# Patient Record
Sex: Female | Born: 1995 | Race: White | Hispanic: No | Marital: Single | Smoking: Former smoker
Health system: Southern US, Community
[De-identification: ages and names within clinical notes are randomized; demographics above are authoritative.]

## PROBLEM LIST (undated history)

## (undated) ENCOUNTER — Inpatient Hospital Stay (HOSPITAL_COMMUNITY): Payer: Self-pay

## (undated) ENCOUNTER — Ambulatory Visit: Discharge status: Absent without leave | Payer: Medicaid Other

## (undated) DIAGNOSIS — G44309 Post-traumatic headache, unspecified, not intractable: Secondary | ICD-10-CM

## (undated) DIAGNOSIS — Z30017 Encounter for initial prescription of implantable subdermal contraceptive: Secondary | ICD-10-CM

## (undated) DIAGNOSIS — S0990XS Unspecified injury of head, sequela: Secondary | ICD-10-CM

## (undated) DIAGNOSIS — T1490XA Injury, unspecified, initial encounter: Secondary | ICD-10-CM

## (undated) DIAGNOSIS — J45909 Unspecified asthma, uncomplicated: Secondary | ICD-10-CM

## (undated) DIAGNOSIS — R569 Unspecified convulsions: Secondary | ICD-10-CM

## (undated) DIAGNOSIS — J069 Acute upper respiratory infection, unspecified: Secondary | ICD-10-CM

## (undated) HISTORY — PX: BLADDER SURGERY: SHX569

## (undated) HISTORY — DX: Unspecified convulsions: R56.9

## (undated) HISTORY — PX: TONSILLECTOMY: SUR1361

## (undated) HISTORY — DX: Encounter for initial prescription of implantable subdermal contraceptive: Z30.017

## (undated) HISTORY — DX: Injury, unspecified, initial encounter: T14.90XA

## (undated) HISTORY — PX: SALIVARY GLAND SURGERY: SHX768

## (undated) HISTORY — DX: Unspecified injury of head, sequela: S09.90XS

## (undated) HISTORY — DX: Unspecified injury of head, sequela: G44.309

---
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
## 2016-01-06 NOTE — ED Provider Notes (Signed)
CSN: [id number]     Arrival date & time 12/26/15  1840 History   First MD Initiated Contact with Patient 12/26/15 1855     Chief Complaint  Patient presents with  . URI     (Consider location/radiation/quality/duration/timing/severity/associated sxs/prior Treatment) HPI   20 year old female with cough and congestion. Symptom onset about a month ago. She reports being treated with azithromycin. Symptoms may be improved slightly during this time. Cough is nonproductive. She has a lot of facial/sinus pressure. No fevers or chills. No rash. No unusual leg pain or swelling. No shortness of breath.  Past Medical History  Diagnosis Date  . Asthma   . 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 Tonsillectomy    . Salivary gland surgery    . Bladder surgery      uteral reimplantation  . Orif tibia plateau Right 12/29/2014    Procedure: OPEN REDUCTION INTERNAL FIXATION (ORIF) TIBIAL PLATEAU;  Surgeon: Sheral Apley, MD;  Location: [facility] OR;  Service: Orthopedics;  Laterality: Right;   Family History  Problem Relation Age of Onset  . Ovarian cysts Mother   . Endometriosis Mother   . Fibromyalgia Mother   . Hepatitis C Mother   . Diabetes Mother   . Cancer Maternal Aunt     ovarian  . Cancer Maternal Grandfather     bladder  . Cancer Paternal Grandmother     lung  . Bipolar disorder Father    Social History  Substance Use Topics  . Smoking status: Passive Smoke Exposure - Never Smoker  . Smokeless tobacco: Never Used  . Alcohol Use: Yes     Comment: rare   OB History    No data available     Review of Systems  All systems reviewed and negative, other than as noted in HPI.   Allergies  Review of patient's allergies indicates no known allergies.  Home Medications   Prior to Admission medications   Medication Sig Start Date End Date Taking? Authorizing Provider  albuterol (PROAIR HFA) 108 (90 Base) MCG/ACT inhaler Inhale 2  puffs into the lungs every 6 (six) hours as needed for wheezing. 12/26/15   Raeford Razor, MD  beclomethasone (QVAR) 40 MCG/ACT inhaler Inhale 2 puffs into the lungs daily. 12/26/15   Raeford Razor, MD  cetirizine (ZYRTEC) 10 MG tablet Take 1 tablet (10 mg total) by mouth daily. 12/26/15   Raeford Razor, MD  fluticasone (FLONASE) 50 MCG/ACT nasal spray Place 2 sprays into both nostrils daily. 12/26/15   Raeford Razor, MD  ibuprofen (ADVIL,MOTRIN) 400 MG tablet Take 1 tablet (400 mg total) by mouth every 6 (six) hours as needed for pain. Patient not taking: Reported on 11/12/2014 11/11/12   Glynn Octave, MD  LORazepam (ATIVAN) 1 MG tablet Take 1 tablet (1 mg total) by mouth every 4 (four) hours as needed for anxiety. Patient not taking: Reported on 07/15/2015 12/31/14   Freeman Caldron, PA-C  medroxyPROGESTERone (DEPO-PROVERA) 150 MG/ML injection INJECT 1 ML ( ) INTRAMUSCULARLY EVERY 3 MONTHS. Patient not taking: Reported on 07/15/2015 11/05/14   Adline Potter, NP  Methocarbamol (ROBAXIN PO) Take 500 mg by mouth 2 (two) times daily as needed.    Historical Provider, MD  montelukast (SINGULAIR) 10 MG tablet Take 10 mg by mouth at bedtime.    Historical Provider, MD  Norethindrone Acetate-Ethinyl Estrad-FE (LOESTRIN 24 FE) 1-20 MG-MCG(24) tablet Take 1 tablet by mouth daily. 07/15/15  Tilda BurrowJohn Ferguson V, MD  oxyCODONE-acetaminophen (PERCOCET) 10-325 MG per tablet Take 1-2 tablets by mouth every 4 (four) hours as needed for pain. Patient not taking: Reported on 07/15/2015 12/31/14   Freeman CaldronMichael J Jeffery, PA-C  predniSONE (DELTASONE) 20 MG tablet Take 2 tablets (40 mg total) by mouth daily. 12/26/15   Raeford RazorStephen Basheer Molchan, MD  traMADol (ULTRAM) 50 MG tablet Take 2 tablets (100 mg total) by mouth every 6 (six) hours. Patient not taking: Reported on 07/15/2015 12/31/14   Freeman CaldronMichael J Jeffery, PA-C   BP 106/68 mmHg  Pulse 78  Temp(Src) 98.4 F (36.9 C) (Temporal)  Resp 18  Ht 5\' 5"  (1.651 m)  Wt 116 lb (52.617 kg)   BMI 19.30 kg/m2  SpO2 99%  LMP 12/05/2015 Physical Exam  Constitutional: She appears well-developed and well-nourished. No distress.  Sitting in bed. No acute distress. No increased work of breathing.  HENT:  Head: Normocephalic and atraumatic.  Eyes: Conjunctivae are normal. Pupils are equal, round, and reactive to light. Right eye exhibits no discharge. Left eye exhibits no discharge.  Neck: Neck supple.  Cardiovascular: Normal rate, regular rhythm and normal heart sounds.  Exam reveals no gallop and no friction rub.   No murmur heard. Pulmonary/Chest: Effort normal and breath sounds normal. No respiratory distress. She has no wheezes.  Abdominal: Soft. She exhibits no distension. There is no tenderness.  Musculoskeletal: She exhibits no edema or tenderness.  Lower extremities symmetric as compared to each other. No calf tenderness. Negative Homan's. No palpable cords.   Neurological: She is alert.  Skin: Skin is warm and dry.  Psychiatric: She has a normal mood and affect. Her behavior is normal. Thought content normal.  Nursing note and vitals reviewed.   ED Course  Procedures (including critical care time) Labs Review Labs Reviewed - No data to display  Imaging Review No results found. I have personally reviewed and evaluated these images and lab results as part of my medical decision-making.   EKG Interpretation None      MDM   Final diagnoses:  Viral URI with cough    20 year old female with likely viral URI. No increased work of breathing. Generally appears very well. Very low suspicion for emergent process.    Raeford RazorStephen Lamarion Mcevers, MD 01/06/16 [phone]

## 2016-01-07 ENCOUNTER — Emergency Department (HOSPITAL_COMMUNITY)
Admission: EM | Admit: 2016-01-07 | Discharge: 2016-01-07 | Disposition: A | Discharge status: Home / Self Care | Payer: Medicaid Other | Attending: Emergency Medicine | Admitting: Emergency Medicine

## 2016-01-07 ENCOUNTER — Encounter (HOSPITAL_COMMUNITY): Payer: Self-pay

## 2016-01-07 DIAGNOSIS — J45909 Unspecified asthma, uncomplicated: Secondary | ICD-10-CM | POA: Diagnosis not present

## 2016-01-07 DIAGNOSIS — M545 Low back pain: Secondary | ICD-10-CM | POA: Insufficient documentation

## 2016-01-07 DIAGNOSIS — M5489 Other dorsalgia: Secondary | ICD-10-CM

## 2016-01-07 DIAGNOSIS — Z7722 Contact with and (suspected) exposure to environmental tobacco smoke (acute) (chronic): Secondary | ICD-10-CM | POA: Diagnosis not present

## 2016-01-07 DIAGNOSIS — Z79899 Other long term (current) drug therapy: Secondary | ICD-10-CM | POA: Diagnosis not present

## 2016-01-07 DIAGNOSIS — T148XXA Other injury of unspecified body region, initial encounter: Secondary | ICD-10-CM

## 2016-01-07 MED ORDER — DEXAMETHASONE SODIUM PHOSPHATE 4 MG/ML IJ SOLN: 8.0000 mg | Freq: Once | INTRAMUSCULAR | Status: DC

## 2016-01-07 MED ORDER — TIZANIDINE HCL 4 MG PO CAPS: 4.0000 mg | ORAL_CAPSULE | Freq: Three times a day (TID) | ORAL | Status: DC | PRN

## 2016-01-07 MED ORDER — DIAZEPAM 5 MG PO TABS
5.0000 mg | ORAL_TABLET | Freq: Once | ORAL | Status: AC
  Administered 2016-01-07: 5 mg via ORAL
  Filled 2016-01-07: qty 1

## 2016-01-07 MED ORDER — DICLOFENAC SODIUM 75 MG PO TBEC: 75.0000 mg | DELAYED_RELEASE_TABLET | Freq: Two times a day (BID) | ORAL | Status: DC

## 2016-01-07 MED ORDER — KETOROLAC TROMETHAMINE 10 MG PO TABS
10.0000 mg | ORAL_TABLET | Freq: Once | ORAL | Status: AC
  Administered 2016-01-07: 10 mg via ORAL
  Filled 2016-01-07: qty 1

## 2016-01-07 NOTE — ED Provider Notes (Signed)
CSN: [id number]     Arrival date & time 01/07/16  1944 History   First MD Initiated Contact with Patient 01/07/16 2011     Chief Complaint  Patient presents with  . Back Pain     (Consider location/radiation/quality/duration/timing/severity/associated sxs/prior Treatment) HPI Comments: Patient is a 20 year old female who presents to the emergency department with a complaint of back pain.  The patient states that she has been no living items around in her home recently. She has also been on her feet more than usual. She now has lower back pain. His been no loss of bowel or bladder function. His been no recurrent falls. Patient denies having any previous operations or procedures involving the lower back.  The patient states that she is also having some sinus related issues. She denies any blood in the mucus. His been no high fever reported.  The history is provided by the patient.    Past Medical History  Diagnosis Date  . Asthma   . 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 Tonsillectomy    . Salivary gland surgery    . Bladder surgery      uteral reimplantation  . Orif tibia plateau Right 12/29/2014    Procedure: OPEN REDUCTION INTERNAL FIXATION (ORIF) TIBIAL PLATEAU;  Surgeon: Sheral Apleyimothy D Murphy, MD;  Location: [facility] OR;  Service: Orthopedics;  Laterality: Right;   Family History  Problem Relation Age of Onset  . Ovarian cysts Mother   . Endometriosis Mother   . Fibromyalgia Mother   . Hepatitis C Mother   . Diabetes Mother   . Cancer Maternal Aunt     ovarian  . Cancer Maternal Grandfather     bladder  . Cancer Paternal Grandmother     lung  . Bipolar disorder Father    Social History  Substance Use Topics  . Smoking status: Passive Smoke Exposure - Never Smoker  . Smokeless tobacco: Never Used  . Alcohol Use: Yes     Comment: rare   OB History    No data available     Review of Systems  Constitutional: Negative for  activity change.       All ROS Neg except as noted in HPI  HENT: Negative for nosebleeds.   Eyes: Negative for photophobia and discharge.  Respiratory: Negative for cough, shortness of breath and wheezing.   Cardiovascular: Negative for chest pain and palpitations.  Gastrointestinal: Negative for abdominal pain and blood in stool.  Genitourinary: Negative for dysuria, frequency and hematuria.  Musculoskeletal: Negative for back pain, arthralgias and neck pain.  Skin: Negative.   Neurological: Negative for dizziness, seizures and speech difficulty.  Psychiatric/Behavioral: Negative for hallucinations and confusion.  All other systems reviewed and are negative.     Allergies  Review of patient's allergies indicates no known allergies.  Home Medications   Prior to Admission medications   Medication Sig Start Date End Date Taking? Authorizing Provider  albuterol (PROAIR HFA) 108 (90 Base) MCG/ACT inhaler Inhale 2 puffs into the lungs every 6 (six) hours as needed for wheezing. 12/26/15  Yes Raeford RazorStephen Kohut, MD  beclomethasone (QVAR) 40 MCG/ACT inhaler Inhale 2 puffs into the lungs daily. 12/26/15  Yes Raeford RazorStephen Kohut, MD  cetirizine (ZYRTEC) 10 MG tablet Take 1 tablet (10 mg total) by mouth daily. 12/26/15  Yes Raeford RazorStephen Kohut, MD  fluticasone (FLONASE) 50 MCG/ACT nasal spray Place 2 sprays into both nostrils daily. 12/26/15  Yes Jeannett SeniorStephen  Kohut, MD  methocarbamol (ROBAXIN) 500 MG tablet Take 500 mg by mouth daily as needed.   Yes Historical Provider, MD  montelukast (SINGULAIR) 10 MG tablet Take 10 mg by mouth at bedtime.   Yes Historical Provider, MD  Norethindrone Acetate-Ethinyl Estrad-FE (LOESTRIN 24 FE) 1-20 MG-MCG(24) tablet Take 1 tablet by mouth daily. Patient not taking: Reported on 01/07/2016 07/15/15   Tilda Burrow, MD  predniSONE (DELTASONE) 20 MG tablet Take 2 tablets (40 mg total) by mouth daily. Patient not taking: Reported on 01/07/2016 12/26/15   Raeford Razor, MD   BP 108/59 mmHg   Pulse 89  Temp(Src) 98.4 F (36.9 C) (Oral)  Resp 16  Ht  (1.575 m)  Wt 51.256 kg  BMI 20.66 kg/m2  SpO2 100%  LMP 12/30/2015 Physical Exam  Constitutional: She is oriented to person, place, and time. She appears well-developed and well-nourished.  Non-toxic appearance.  HENT:  Head: Normocephalic.  Right Ear: Tympanic membrane and external ear normal.  Left Ear: Tympanic membrane and external ear normal.  Mild nasal conges  Eyes: EOM and lids are normal. Pupils are equal, round, and reactive to light.  Neck: Normal range of motion. Neck supple. Carotid bruit is not present.  Cardiovascular: Normal rate, regular rhythm, normal heart sounds, intact distal pulses and normal pulses.   Pulmonary/Chest: Breath sounds normal. No respiratory distress.  Abdominal: Soft. Bowel sounds are normal. There is no tenderness. There is no guarding.  Musculoskeletal: She exhibits tenderness.       Lumbar back: She exhibits decreased range of motion, tenderness and spasm.  Lymphadenopathy:       Head (right side): No submandibular adenopathy present.       Head (left side): No submandibular adenopathy present.    She has no cervical adenopathy.  Neurological: She is alert and oriented to person, place, and time. She has normal strength. No cranial nerve deficit or sensory deficit.  Skin: Skin is warm and dry.  Psychiatric: She has a normal mood and affect. Her speech is normal.  Nursing note and vitals reviewed.   ED Course  Procedures (including critical care time) Labs Review Labs Reviewed - No data to display  Imaging Review No results found. I have personally reviewed and evaluated these images and lab results as part of my medical decision-making.   EKG Interpretation None      MDM  There no gross neurologic deficits appreciated at this time. The patient is ambulatory with minimal problem. The patient will be placed on Zanaflex and diclofenac. Patient is encouraged to use a  heating pad when possible. Patient in agreement with this discharge plan.    Final diagnoses:  None    *I have reviewed nursing notes, vital signs, and all appropriate lab and imaging results for this patient.[address] PA-C 01/12/16 0038  Rolland Porter, MD 01/23/16 2050

## 2016-01-07 NOTE — Discharge Instructions (Signed)
Warm tub soaks 2 times daily will be helpful for your back. Please use Zanaflex 3 times daily. This medication may cause drowsiness, please do not drive, drink alcohol, operate machinery, or pertussis. In activities requiring concentration when taking this medication. Use diclofenac 2 times daily with food. Please see the orthopedic specialist listed above, or the orthopedic specialist of your choice as sone as possible concerning your back.  Musculoskeletal Pain Musculoskeletal pain is muscle and boney aches and pains. These pains can occur in any part of the body. Your caregiver may treat you without knowing the cause of the pain. They may treat you if blood or urine tests, X-rays, and other tests were normal.  CAUSES There is often not a definite cause or reason for these pains. These pains may be caused by a type of germ (virus). The discomfort may also come from overuse. Overuse includes working out too hard when your body is not fit. Boney aches also come from weather changes. Bone is sensitive to atmospheric pressure changes. HOME CARE INSTRUCTIONS   Ask when your test results will be ready. Make sure you get your test results.  Only take over-the-counter or prescription medicines for pain, discomfort, or fever as directed by your caregiver. If you were given medications for your condition, do not drive, operate machinery or power tools, or sign legal documents for 24 hours. Do not drink alcohol. Do not take sleeping pills or other medications that may interfere with treatment.  Continue all activities unless the activities cause more pain. When the pain lessens, slowly resume normal activities. Gradually increase the intensity and duration of the activities or exercise.  During periods of severe pain, bed rest may be helpful. Lay or sit in any position that is comfortable.  Putting ice on the injured area.  Put ice in a bag.  Place a towel between your skin and the bag.  Leave the ice  on for 15 to 20 minutes, 3 to 4 times a day.  Follow up with your caregiver for continued problems and no reason can be found for the pain. If the pain becomes worse or does not go away, it may be necessary to repeat tests or do additional testing. Your caregiver may need to look further for a possible cause. SEEK IMMEDIATE MEDICAL CARE IF:  You have pain that is getting worse and is not relieved by medications.  You develop chest pain that is associated with shortness or breath, sweating, feeling sick to your stomach (nauseous), or throw up (vomit).  Your pain becomes localized to the abdomen.  You develop any new symptoms that seem different or that concern you. MAKE SURE YOU:   Understand these instructions.  Will watch your condition.  Will get help right away if you are not doing well or get worse.   This information is not intended to replace advice given to you by your health care provider. Make sure you discuss any questions you have with your health care provider.   Document Released: 09/05/2005 Document Revised: 11/28/2011 Document Reviewed: 05/10/2013 Elsevier Interactive Patient Education [employer]2016 Elsevier Inc.

## 2016-01-07 NOTE — ED Notes (Signed)
My back started bothering me yesterday.  I have been moving things around my house. No known injury. My sinuses are bothering me also.

## 2016-02-08 ENCOUNTER — Encounter (HOSPITAL_COMMUNITY): Payer: Self-pay | Admitting: Emergency Medicine

## 2016-02-08 ENCOUNTER — Emergency Department (HOSPITAL_COMMUNITY): Payer: Medicaid Other

## 2016-02-08 ENCOUNTER — Emergency Department (HOSPITAL_COMMUNITY)
Admission: EM | Admit: 2016-02-08 | Discharge: 2016-02-09 | Disposition: A | Discharge status: Home / Self Care | Payer: Medicaid Other | Attending: Emergency Medicine | Admitting: Emergency Medicine

## 2016-02-08 DIAGNOSIS — Z7722 Contact with and (suspected) exposure to environmental tobacco smoke (acute) (chronic): Secondary | ICD-10-CM | POA: Diagnosis not present

## 2016-02-08 DIAGNOSIS — Z79899 Other long term (current) drug therapy: Secondary | ICD-10-CM | POA: Insufficient documentation

## 2016-02-08 DIAGNOSIS — J45909 Unspecified asthma, uncomplicated: Secondary | ICD-10-CM | POA: Diagnosis not present

## 2016-02-08 DIAGNOSIS — R1031 Right lower quadrant pain: Secondary | ICD-10-CM

## 2016-02-08 DIAGNOSIS — N760 Acute vaginitis: Secondary | ICD-10-CM | POA: Diagnosis not present

## 2016-02-08 DIAGNOSIS — B9689 Other specified bacterial agents as the cause of diseases classified elsewhere: Secondary | ICD-10-CM

## 2016-02-08 LAB — URINALYSIS, ROUTINE W REFLEX MICROSCOPIC
Bilirubin Urine: NEGATIVE
Glucose, UA: NEGATIVE mg/dL
Ketones, ur: NEGATIVE mg/dL
Leukocytes, UA: NEGATIVE
Nitrite: NEGATIVE
Protein, ur: NEGATIVE mg/dL
Specific Gravity, Urine: 1.025 (ref 1.005–1.030)
pH: 5.5 (ref 5.0–8.0)

## 2016-02-08 LAB — COMPREHENSIVE METABOLIC PANEL
ALT: 12 U/L — ABNORMAL LOW (ref 14–54)
AST: 19 U/L (ref 15–41)
Albumin: 4.5 g/dL (ref 3.5–5.0)
Alkaline Phosphatase: 74 U/L (ref 38–126)
Anion gap: 6 (ref 5–15)
BUN: 14 mg/dL (ref 6–20)
CO2: 25 mmol/L (ref 22–32)
Calcium: 9.3 mg/dL (ref 8.9–10.3)
Chloride: 107 mmol/L (ref 101–111)
Creatinine, Ser: 0.93 mg/dL (ref 0.44–1.00)
GFR calc Af Amer: >60 mL/min (ref >60)
GFR calc non Af Amer: >60 mL/min (ref >60)
Glucose, Bld: 74 mg/dL (ref 65–99)
Potassium: 4.2 mmol/L (ref 3.5–5.1)
Sodium: 138 mmol/L (ref 135–145)
Total Bilirubin: 0.6 mg/dL (ref 0.3–1.2)
Total Protein: 7.7 g/dL (ref 6.5–8.1)

## 2016-02-08 LAB — WET PREP, GENITAL
Sperm: NONE SEEN
Trich, Wet Prep: NONE SEEN
Yeast Wet Prep HPF POC: NONE SEEN

## 2016-02-08 LAB — LIPASE, BLOOD: Lipase: 23 U/L (ref 11–51)

## 2016-02-08 LAB — URINE MICROSCOPIC-ADD ON

## 2016-02-08 LAB — CBC
HCT: 43 % (ref 36.0–46.0)
Hemoglobin: 14.2 g/dL (ref 12.0–15.0)
MCH: 27.8 pg (ref 26.0–34.0)
MCHC: 33 g/dL (ref 30.0–36.0)
MCV: 84.1 fL (ref 78.0–100.0)
Platelets: 286 10*3/uL (ref 150–400)
RBC: 5.11 MIL/uL (ref 3.87–5.11)
RDW: 13.1 % (ref 11.5–15.5)
WBC: 9.1 10*3/uL (ref 4.0–10.5)

## 2016-02-08 LAB — PREGNANCY, URINE: Preg Test, Ur: NEGATIVE

## 2016-02-08 MED ORDER — METRONIDAZOLE 500 MG PO TABS: 500.0000 mg | ORAL_TABLET | Freq: Two times a day (BID) | ORAL | Status: DC

## 2016-02-08 MED ORDER — DIATRIZOATE MEGLUMINE & SODIUM 66-10 % PO SOLN
ORAL | Status: AC
  Filled 2016-02-08: qty 30

## 2016-02-08 MED ORDER — IOPAMIDOL (ISOVUE-300) INJECTION 61%
100.0000 mL | Freq: Once | INTRAVENOUS | Status: AC | PRN
  Administered 2016-02-08: 100 mL via INTRAVENOUS

## 2016-02-08 NOTE — ED Provider Notes (Signed)
CSN: [id number]     Arrival date & time 02/08/16  1709 History   First MD Initiated Contact with Patient 02/08/16 2042     Chief Complaint  Patient presents with  . Abdominal Pain     (Consider location/radiation/quality/duration/timing/severity/associated sxs/prior Treatment) HPI Comments: Patient sent from urgent care with a six-day history of right lower quadrant abdominal pain that is constant. She was diagnosed with a sinus infection and upper respiratory infection urgent care today. She is concerned that she is pregnant as her last menstrual cycle was April 13. Denies any vomiting. Denies any fever. Denies vaginal bleeding or discharge. No dysuria hematuria. sHe is sexually active with protection. No previous abdominal surgeries other than ureter repair as a baby. She states her bowel movements have sometimes been constipation and sometimes been diarrhea. She has a good appetite.  The history is provided by the patient.    Past Medical History  Diagnosis Date  . Asthma   . 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 Tonsillectomy    . Salivary gland surgery    . Bladder surgery      uteral reimplantation  . Orif tibia plateau Right 12/29/2014    Procedure: OPEN REDUCTION INTERNAL FIXATION (ORIF) TIBIAL PLATEAU;  Surgeon: Sheral Apley, MD;  Location: [facility] OR;  Service: Orthopedics;  Laterality: Right;   Family History  Problem Relation Age of Onset  . Ovarian cysts Mother   . Endometriosis Mother   . Fibromyalgia Mother   . Hepatitis C Mother   . Diabetes Mother   . Cancer Maternal Aunt     ovarian  . Cancer Maternal Grandfather     bladder  . Cancer Paternal Grandmother     lung  . Bipolar disorder Father    Social History  Substance Use Topics  . Smoking status: Passive Smoke Exposure - Never Smoker  . Smokeless tobacco: Never Used  . Alcohol Use: Yes     Comment: rare   OB History    No data available      Review of Systems  Constitutional: Negative for fever, activity change, appetite change and fatigue.  Respiratory: Negative for chest tightness and shortness of breath.   Gastrointestinal: Positive for abdominal pain. Negative for nausea, vomiting, diarrhea and constipation.  Genitourinary: Positive for pelvic pain. Negative for frequency, flank pain, vaginal bleeding, vaginal discharge and vaginal pain.  Musculoskeletal: Positive for back pain. Negative for myalgias and arthralgias.  Skin: Negative for rash.  Neurological: Negative for dizziness, weakness and headaches.  A complete 10 system review of systems was obtained and all systems are negative except as noted in the HPI and PMH.      Allergies  Review of patient's allergies indicates no known allergies.  Home Medications   Prior to Admission medications   Medication Sig Start Date End Date Taking? Authorizing Provider  albuterol (PROAIR HFA) 108 (90 Base) MCG/ACT inhaler Inhale 2 puffs into the lungs every 6 (six) hours as needed for wheezing. 12/26/15  Yes Raeford Razor, MD  beclomethasone (QVAR) 40 MCG/ACT inhaler Inhale 2 puffs into the lungs daily. 12/26/15  Yes Raeford Razor, MD  cetirizine (ZYRTEC) 10 MG tablet Take 1 tablet (10 mg total) by mouth daily. 12/26/15  Yes Raeford Razor, MD  diclofenac (VOLTAREN) 75 MG EC tablet Take 1 tablet (75 mg total) by mouth 2 (two) times daily. 01/07/16   Ivery Quale, PA-C  fluticasone (FLONASE) 50 MCG/ACT  nasal spray Place 2 sprays into both nostrils daily. 12/26/15   Raeford Razor, MD  methocarbamol (ROBAXIN) 500 MG tablet Take 500 mg by mouth daily as needed.    Historical Provider, MD  metroNIDAZOLE (FLAGYL) 500 MG tablet Take 1 tablet (500 mg total) by mouth 2 (two) times daily. 02/08/16   Glynn Octave, MD  montelukast (SINGULAIR) 10 MG tablet Take 10 mg by mouth at bedtime.    Historical Provider, MD  Norethindrone Acetate-Ethinyl Estrad-FE (LOESTRIN 24 FE) 1-20 MG-MCG(24) tablet  Take 1 tablet by mouth daily. Patient not taking: Reported on 01/07/2016 07/15/15   Tilda Burrow, MD  predniSONE (DELTASONE) 20 MG tablet Take 2 tablets (40 mg total) by mouth daily. Patient not taking: Reported on 01/07/2016 12/26/15   Raeford Razor, MD  tiZANidine (ZANAFLEX) 4 MG capsule Take 1 capsule (4 mg total) by mouth 3 (three) times daily as needed for muscle spasms. 01/07/16   Ivery Quale, PA-C   BP 107/69 mmHg  Pulse 66  Temp(Src) 97.9 F (36.6 C) (Oral)  Resp 18  Wt 115 lb (52.164 kg)  SpO2 100%  LMP 12/31/2015 Physical Exam  Constitutional: She is oriented to person, place, and time. She appears well-developed and well-nourished. No distress.  HENT:  Head: Normocephalic and atraumatic.  Mouth/Throat: Oropharynx is clear and moist. No oropharyngeal exudate.  Eyes: Conjunctivae and EOM are normal. Pupils are equal, round, and reactive to light.  Neck: Normal range of motion. Neck supple.  No meningismus.  Cardiovascular: Normal rate, regular rhythm, normal heart sounds and intact distal pulses.   No murmur heard. Pulmonary/Chest: Effort normal and breath sounds normal. No respiratory distress.  Abdominal: Soft. There is tenderness. There is no rebound and no guarding.  TTP RLQ and suprapubic area. No guarding or rebound  Genitourinary: Vaginal discharge found.  Chaperone present. Normal external genitalia. White vaginal discharge in vault. No cervical motion tenderness. No lateralizing adnexal tenderness  Musculoskeletal: Normal range of motion. She exhibits no edema or tenderness.  Neurological: She is alert and oriented to person, place, and time. No cranial nerve deficit. She exhibits normal muscle tone. Coordination normal.  No ataxia on finger to nose bilaterally. No pronator drift. 5/5 strength throughout. CN 2-12 intact.Equal grip strength. Sensation intact.   Skin: Skin is warm.  Psychiatric: She has a normal mood and affect. Her behavior is normal.  Nursing note  and vitals reviewed.   ED Course  Procedures (including critical care time) Labs Review Labs Reviewed  WET PREP, GENITAL - Abnormal; Notable for the following:    Clue Cells Wet Prep HPF POC PRESENT (*)    WBC, Wet Prep HPF POC FEW (*)    All other components within normal limits  COMPREHENSIVE METABOLIC PANEL - Abnormal; Notable for the following:    ALT 12 (*)    All other components within normal limits  URINALYSIS, ROUTINE W REFLEX MICROSCOPIC (NOT AT [facility]) - Abnormal; Notable for the following:    Hgb urine dipstick TRACE (*)    All other components within normal limits  URINE MICROSCOPIC-ADD ON - Abnormal; Notable for the following:    Squamous Epithelial / LPF 0-5 (*)    Bacteria, UA MANY (*)    All other components within normal limits  LIPASE, BLOOD  CBC  PREGNANCY, URINE  GC/CHLAMYDIA PROBE AMP ([place]) NOT AT [facility]    Imaging Review No results found. I have personally reviewed and evaluated these images and lab results as part of my medical  decision-making.   EKG Interpretation None      MDM   Final diagnoses:  RLQ abdominal pain  Bacterial vaginal infection   6 days of right lower quadrant abdominal pain without associated symptoms. Patient concerned that she is pregnant. Pregnancy test is negative.  Pelvic exam benign as above. Bacteruria on UA. Culture sent.   We'll obtain CT scan to evaluate for appendicitis versus other pathology. Care transferred to Dr. Deretha EmoryZackowski at shift change. Will treat bacterial vaginosis.  Glynn OctaveStephen Crickett Abbett, MD 02/08/16 2209

## 2016-02-08 NOTE — ED Notes (Signed)
Patient sent over by [facility] for complaint of right lower quadrant pain x 6 days. She was diagnosed at facility with sinusitis and URI today. States her last menstrual period was April 13 and it is possible she may be pregnant. Denies vomiting.

## 2016-02-08 NOTE — Discharge Instructions (Signed)

## 2016-02-09 ENCOUNTER — Ambulatory Visit: Payer: Medicaid Other | Admitting: Obstetrics & Gynecology

## 2016-02-10 LAB — URINE CULTURE: Culture: NO GROWTH

## 2016-02-10 LAB — GC/CHLAMYDIA PROBE AMP (~~LOC~~) NOT AT ARMC
Chlamydia: NEGATIVE
Neisseria Gonorrhea: NEGATIVE

## 2016-02-16 ENCOUNTER — Ambulatory Visit: Payer: Medicaid Other | Admitting: Obstetrics & Gynecology

## 2016-02-16 ENCOUNTER — Encounter: Payer: Self-pay | Admitting: *Deleted

## 2016-09-19 NOTE — L&D Delivery Note (Signed)
Patient is 21 y.o. G1P0 [redacted]w[redacted]d admitted for PROM. S/p Augmentation with Pitocin. SROM at 0001 (06/18/17).  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  Delivery Note At 2:25 AM a viable female was delivered via Vaginal, Spontaneous Delivery (Presentation: ROA). Delivered without complications.  APGAR: 9, 9; weight pending.   Placenta status: Intact.  Cord: 3V  with the following complications: None.  Cord pH: N/A  Anesthesia:  Epidural Episiotomy: None Lacerations:  Perineal tear Suture Repair: monocryl Est. Blood Loss (mL): 150  Mom to postpartum.  Baby to Couplet care / Skin to Skin.   Counts of sharps, instruments, and lap pads were all correct.  [name], DO OB Fellow

## 2016-11-13 ENCOUNTER — Emergency Department (HOSPITAL_COMMUNITY)
Admission: EM | Admit: 2016-11-13 | Discharge: 2016-11-13 | Disposition: A | Discharge status: Home / Self Care | Payer: Medicaid Other | Attending: Emergency Medicine | Admitting: Emergency Medicine

## 2016-11-13 ENCOUNTER — Other Ambulatory Visit: Payer: Self-pay | Admitting: Obstetrics and Gynecology

## 2016-11-13 ENCOUNTER — Encounter (HOSPITAL_COMMUNITY): Payer: Self-pay | Admitting: Emergency Medicine

## 2016-11-13 ENCOUNTER — Telehealth: Payer: Self-pay | Admitting: Obstetrics and Gynecology

## 2016-11-13 DIAGNOSIS — Z3A01 Less than 8 weeks gestation of pregnancy: Secondary | ICD-10-CM | POA: Diagnosis not present

## 2016-11-13 DIAGNOSIS — J45909 Unspecified asthma, uncomplicated: Secondary | ICD-10-CM | POA: Diagnosis not present

## 2016-11-13 DIAGNOSIS — R05 Cough: Secondary | ICD-10-CM | POA: Diagnosis not present

## 2016-11-13 DIAGNOSIS — O26891 Other specified pregnancy related conditions, first trimester: Secondary | ICD-10-CM | POA: Diagnosis present

## 2016-11-13 DIAGNOSIS — O99511 Diseases of the respiratory system complicating pregnancy, first trimester: Secondary | ICD-10-CM | POA: Insufficient documentation

## 2016-11-13 DIAGNOSIS — J029 Acute pharyngitis, unspecified: Secondary | ICD-10-CM

## 2016-11-13 DIAGNOSIS — Z7722 Contact with and (suspected) exposure to environmental tobacco smoke (acute) (chronic): Secondary | ICD-10-CM | POA: Insufficient documentation

## 2016-11-13 LAB — RAPID STREP SCREEN (MED CTR MEBANE ONLY): Streptococcus, Group A Screen (Direct): NEGATIVE

## 2016-11-13 MED ORDER — ALBUTEROL SULFATE HFA 108 (90 BASE) MCG/ACT IN AERS: 2.0000 | INHALATION_SPRAY | Freq: Four times a day (QID) | RESPIRATORY_TRACT | 1 refills | Status: DC | PRN

## 2016-11-13 NOTE — ED Triage Notes (Signed)
[redacted] weeks pregnant with 2 day history of cough and sore throat Has taken no meds due to her being pregnant [redacted] weeks

## 2016-11-13 NOTE — ED Provider Notes (Signed)
[facility]-EMER[facility]Y DEPT Provider Note   CSN: [id number] Arrival date & time: 11/13/16  1427  By signing my name below, I, Katelyn Martinez, attest that this documentation has been prepared under the direction and in the presence of Burgess Amor, PA-C.  Electronically Signed: Cynda Martinez, Scribe. 11/13/16. 3:29 PM.  History   Chief Complaint Chief Complaint  Patient presents with  . Cough  . Sore Throat    x 2 days  . Routine Prenatal Visit    6 weeks    HPI Comments: Katelyn Martinez is a 21 y.o. female with a hx of asthma, who presents to the Emergency Department complaining of a sudden-onset sore throat that began 2 days ago. Patient is [Katelyn Martinez] weeks pregnant. Patient has associated non-productive cough, nasal congestion,  subjective fever, and postnasal drip. She has taken no medicines since she is pregnant. [facility] Patient did not have a flu shot this year. Patient eating and tolerating fluids well. Patient denies any wheezing, nausea, vomiting, or abdominal pain. Patients last normal menstrual period was January 8th.    The history is provided by the patient. No language interpreter was used.    Past Medical History:  Diagnosis Date  . Asthma   . 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 Tibial plateau fracture, right 12/24/2014  . Fracture of right fibula 12/24/2014  . Back abrasion 12/24/2014  . Lumbar transverse process fracture (HCC) 12/24/2014  . Asthma 12/24/2014  . Anxiety disorder 12/24/2014  . Intracranial hemorrhage (HCC)   . Pneumocephalus, traumatic   . Pulmonary contusion   . Temporal bone fracture (HCC)   . Trauma   . Basilar skull fracture (HCC) 12/23/2014  . Fall by pediatric patient   . Contraceptive management 11/05/2014    Past Surgical History:  Procedure Laterality Date  . BLADDER SURGERY     uteral reimplantation  . ORIF TIBIA PLATEAU Right 12/29/2014    Procedure: OPEN REDUCTION INTERNAL FIXATION (ORIF) TIBIAL PLATEAU;  Surgeon: Sheral Apley, MD;  Location: [facility] OR;  Service: Orthopedics;  Laterality: Right;  . SALIVARY GLAND SURGERY    . TONSILLECTOMY      OB History    No data available       Home Medications    Prior to Admission medications   Medication Sig Start Date End Date Taking? Authorizing Provider  albuterol (PROAIR HFA) 108 (90 Base) MCG/ACT inhaler Inhale 2 puffs into the lungs every 6 (six) hours as needed for wheezing. 11/13/16   Tilda Burrow, MD  beclomethasone (QVAR) 40 MCG/ACT inhaler Inhale 2 puffs into the lungs daily. 12/26/15   Raeford Razor, MD  cetirizine (ZYRTEC) 10 MG tablet Take 1 tablet (10 mg total) by mouth daily. 12/26/15   Raeford Razor, MD  diclofenac (VOLTAREN) 75 MG EC tablet Take 1 tablet (75 mg total) by mouth 2 (two) times daily. Patient not taking: Reported on 02/08/2016 01/07/16   Ivery Quale, PA-C  fluticasone [facility]) 50 MCG/ACT nasal spray Place 2 sprays into both nostrils daily. 12/26/15   Raeford Razor, MD  metroNIDAZOLE (FLAGYL) 500 MG tablet Take 1 tablet (500 mg total) by mouth 2 (two) times daily. 02/08/16   Glynn Octave, MD  montelukast (SINGULAIR) 10 MG tablet Take 10 mg by mouth at bedtime.    Historical Provider, MD  Norethindrone Acetate-Ethinyl Estrad-FE (LOESTRIN 24 FE) 1-20 MG-MCG(24) tablet Take  1 tablet by mouth daily. Patient not taking: Reported on 01/07/2016 07/15/15   Tilda Burrow, MD  predniSONE (DELTASONE) 20 MG tablet Take 2 tablets (40 mg total) by mouth daily. Patient not taking: Reported on 01/07/2016 12/26/15   Raeford Razor, MD  tiZANidine (ZANAFLEX) 4 MG capsule Take 1 capsule (4 mg total) by mouth 3 (three) times daily as needed for muscle spasms. Patient not taking: Reported on 02/08/2016 01/07/16   Ivery Quale, PA-C    Family History Family History  Problem Relation Age of Onset  . Ovarian cysts Mother   . Endometriosis Mother   . Fibromyalgia Mother     . Hepatitis C Mother   . Diabetes Mother   . Cancer Maternal Aunt     ovarian  . Cancer Maternal Grandfather     bladder  . Cancer Paternal Grandmother     lung  . Bipolar disorder Father     Social History Social History  Substance Use Topics  . Smoking status: Passive Smoke Exposure - Never Smoker  . Smokeless tobacco: Never Used  . Alcohol use Yes     Comment: rare     Allergies   Patient has no known allergies.   Review of Systems Review of Systems  Constitutional: Negative for chills and fever.  HENT: Positive for congestion, postnasal drip and sore throat.   Respiratory: Positive for cough. Negative for chest tightness, shortness of breath and wheezing.   Gastrointestinal: Negative for abdominal pain, diarrhea, nausea and vomiting.  Skin: Negative for rash.     Physical Exam Updated Vital Signs BP 112/65   Pulse 88   Temp 98.9 F (37.2 C) (Oral)   Resp 16   Ht 5\' 5"  (1.651 m)   Wt 52.2 kg   LMP 09/26/2016   SpO2 100%   BMI 19.14 kg/m   Physical Exam  Constitutional: She is oriented to person, place, and time. She appears well-developed.  Patient well appearing, laughing with visitors, talking on cell phone.  HENT:  Head: Normocephalic and atraumatic.  Mouth/Throat: Oropharynx is clear and moist. No oropharyngeal exudate.  Clear nasal drainage and several tiny petechiae on soft palate.   Eyes: Conjunctivae and EOM are normal. Pupils are equal, round, and reactive to light.  Neck: Normal range of motion. Neck supple.  Cardiovascular: Normal rate and regular rhythm.   Pulmonary/Chest: Effort normal and breath sounds normal. She has no wheezes. She has no rales.  Abdominal: Soft. Bowel sounds are normal.  Musculoskeletal: Normal range of motion.  Neurological: She is alert and oriented to person, place, and time.  Skin: Skin is warm and dry. No rash noted.  Psychiatric: She has a normal mood and affect.     ED Treatments / Results  DIAGNOSTIC  STUDIES: Oxygen Saturation is 100% on RA, normal by my interpretation.    COORDINATION OF CARE: 3:29 PM Discussed treatment plan with pt at bedside and pt agreed to plan, which includes tylenol and a strep test.    Labs (all labs ordered are listed, but only abnormal results are displayed) Labs Reviewed  RAPID STREP SCREEN (NOT AT [facility])  CULTURE, GROUP A STREP [facility])    EKG  EKG Interpretation None       Radiology No results found.  Procedures Procedures (including critical care time)  Medications Ordered in ED Medications - No data to display   Initial Impression / Assessment and Plan / ED Course  I have reviewed the triage vital signs and the  nursing notes.  Pertinent labs & imaging results that were available during my care of the patient were reviewed by me and considered in my medical decision making (see chart for details).     Strep negative.  Sx c/w viral uri. Pt in no distress during this visit.  Advised on safe otc meds in pregnancy.  Plan f/u to establish ob care as planned in 4 days.    The patient appears reasonably screened and/or stabilized for discharge and I doubt any other medical condition or other [facility] requiring further screening, evaluation, or treatment in the ED at this time prior to discharge.   Final Clinical Impressions(s) / ED Diagnoses   Final diagnoses:  Viral pharyngitis    New Prescriptions Discharge Medication List as of 11/13/2016  4:09 PM     I personally performed the services described in this documentation, which was scribed in my presence. The recorded information has been reviewed and is accurate.     Burgess AmorJulie Valborg Friar, PA-C 11/14/16 1136    Bethann BerkshireJoseph Zammit, MD 11/14/16 [phone]

## 2016-11-13 NOTE — Telephone Encounter (Signed)
Efforts to reach Katelyn Martinez, Katelyn Martinez, are unsuccessful. All numbers given, even after calling call a nurse given number, are not answered. [phone], and both pt and Martinez's number. [facility]

## 2016-11-15 ENCOUNTER — Telehealth: Payer: Self-pay | Admitting: *Deleted

## 2016-11-16 LAB — CULTURE, GROUP A STREP (THRC)

## 2016-11-17 ENCOUNTER — Encounter: Payer: Self-pay | Admitting: Adult Health

## 2016-11-17 ENCOUNTER — Ambulatory Visit (INDEPENDENT_AMBULATORY_CARE_PROVIDER_SITE_OTHER): Payer: Medicaid Other | Admitting: Adult Health

## 2016-11-17 VITALS — BP 100/60 | HR 74 | Ht 65.0 in | Wt 106.0 lb

## 2016-11-17 DIAGNOSIS — N926 Irregular menstruation, unspecified: Secondary | ICD-10-CM | POA: Diagnosis not present

## 2016-11-17 DIAGNOSIS — R112 Nausea with vomiting, unspecified: Secondary | ICD-10-CM | POA: Diagnosis not present

## 2016-11-17 DIAGNOSIS — Z3490 Encounter for supervision of normal pregnancy, unspecified, unspecified trimester: Secondary | ICD-10-CM

## 2016-11-17 DIAGNOSIS — O3680X0 Pregnancy with inconclusive fetal viability, not applicable or unspecified: Secondary | ICD-10-CM

## 2016-11-17 DIAGNOSIS — Z3201 Encounter for pregnancy test, result positive: Secondary | ICD-10-CM | POA: Diagnosis not present

## 2016-11-17 LAB — POCT URINE PREGNANCY: Preg Test, Ur: POSITIVE — AB

## 2016-11-17 MED ORDER — PRENATAL GUMMIES/DHA & FA 0.4-32.5 MG PO CHEW: 2.0000 | CHEWABLE_TABLET | Freq: Every day | ORAL | Status: DC

## 2016-11-17 NOTE — Patient Instructions (Signed)
First Trimester of Pregnancy The first trimester of pregnancy is from week 1 until the end of week 13 (months 1 through 3). A week after a sperm fertilizes an egg, the egg will implant on the wall of the uterus. This embryo will begin to develop into a baby. Genes from you and your partner will form the baby. The female genes will determine whether the baby will be a boy or a girl. At 6-8 weeks, the eyes and face will be formed, and the heartbeat can be seen on ultrasound. At the end of 12 weeks, all the baby's organs will be formed. Now that you are pregnant, you will want to do everything you can to have a healthy baby. Two of the most important things are to get good prenatal care and to follow your health care provider's instructions. Prenatal care is all the medical care you receive before the baby's birth. This care will help prevent, find, and treat any problems during the pregnancy and childbirth. Body changes during your first trimester Your body goes through many changes during pregnancy. The changes vary from woman to woman.  You may gain or lose a couple of pounds at first.  You may feel sick to your stomach (nauseous) and you may throw up (vomit). If the vomiting is uncontrollable, call your health care provider.  You may tire easily.  You may develop headaches that can be relieved by medicines. All medicines should be approved by your health care provider.  You may urinate more often. Painful urination may mean you have a bladder infection.  You may develop heartburn as a result of your pregnancy.  You may develop constipation because certain hormones are causing the muscles that push stool through your intestines to slow down.  You may develop hemorrhoids or swollen veins (varicose veins).  Your breasts may begin to grow larger and become tender. Your nipples may stick out more, and the tissue that surrounds them (areola) may become darker.  Your gums may bleed and may be  sensitive to brushing and flossing.  Dark spots or blotches (chloasma, mask of pregnancy) may develop on your face. This will likely fade after the baby is born.  Your menstrual periods will stop.  You may have a loss of appetite.  You may develop cravings for certain kinds of food.  You may have changes in your emotions from day to day, such as being excited to be pregnant or being concerned that something may go wrong with the pregnancy and baby.  You may have more vivid and strange dreams.  You may have changes in your hair. These can include thickening of your hair, rapid growth, and changes in texture. Some women also have hair loss during or after pregnancy, or hair that feels dry or thin. Your hair will most likely return to normal after your baby is born.  What to expect at prenatal visits During a routine prenatal visit:  You will be weighed to make sure you and the baby are growing normally.  Your blood pressure will be taken.  Your abdomen will be measured to track your baby's growth.  The fetal heartbeat will be listened to between weeks 10 and 14 of your pregnancy.  Test results from any previous visits will be discussed.  Your health care provider may ask you:  How you are feeling.  If you are feeling the baby move.  If you have had any abnormal symptoms, such as leaking fluid, bleeding, severe headaches,   or abdominal cramping.  If you are using any tobacco products, including cigarettes, chewing tobacco, and electronic cigarettes.  If you have any questions.  Other tests that may be performed during your first trimester include:  Blood tests to find your blood type and to check for the presence of any previous infections. The tests will also be used to check for low iron levels (anemia) and protein on red blood cells (Rh antibodies). Depending on your risk factors, or if you previously had diabetes during pregnancy, you may have tests to check for high blood  sugar that affects pregnant women (gestational diabetes).  Urine tests to check for infections, diabetes, or protein in the urine.  An ultrasound to confirm the proper growth and development of the baby.  Fetal screens for spinal cord problems (spina bifida) and Down syndrome.  HIV (human immunodeficiency virus) testing. Routine prenatal testing includes screening for HIV, unless you choose not to have this test.  You may need other tests to make sure you and the baby are doing well.  Follow these instructions at home: Medicines  Follow your health care provider's instructions regarding medicine use. Specific medicines may be either safe or unsafe to take during pregnancy.  Take a prenatal vitamin that contains at least 600 micrograms (mcg) of folic acid.  If you develop constipation, try taking a stool softener if your health care provider approves. Eating and drinking  Eat a balanced diet that includes fresh fruits and vegetables, whole grains, good sources of protein such as meat, eggs, or tofu, and low-fat dairy. Your health care provider will help you determine the amount of weight gain that is right for you.  Avoid raw meat and uncooked cheese. These carry germs that can cause birth defects in the baby.  Eating four or five small meals rather than three large meals a day may help relieve nausea and vomiting. If you start to feel nauseous, eating a few soda crackers can be helpful. Drinking liquids between meals, instead of during meals, also seems to help ease nausea and vomiting.  Limit foods that are high in fat and processed sugars, such as fried and sweet foods.  To prevent constipation: ? Eat foods that are high in fiber, such as fresh fruits and vegetables, whole grains, and beans. ? Drink enough fluid to keep your urine clear or pale yellow. Activity  Exercise only as directed by your health care provider. Most women can continue their usual exercise routine during  pregnancy. Try to exercise for 30 minutes at least 5 days a week. Exercising will help you: ? Control your weight. ? Stay in shape. ? Be prepared for labor and delivery.  Experiencing pain or cramping in the lower abdomen or lower back is a good sign that you should stop exercising. Check with your health care provider before continuing with normal exercises.  Try to avoid standing for long periods of time. Move your legs often if you must stand in one place for a long time.  Avoid heavy lifting.  Wear low-heeled shoes and practice good posture.  You may continue to have sex unless your health care provider tells you not to. Relieving pain and discomfort  Wear a good support bra to relieve breast tenderness.  Take warm sitz baths to soothe any pain or discomfort caused by hemorrhoids. Use hemorrhoid cream if your health care provider approves.  Rest with your legs elevated if you have leg cramps or low back pain.  If you develop   varicose veins in your legs, wear support hose. Elevate your feet for 15 minutes, 3-4 times a day. Limit salt in your diet. Prenatal care  Schedule your prenatal visits by the twelfth week of pregnancy. They are usually scheduled monthly at first, then more often in the last 2 months before delivery.  Write down your questions. Take them to your prenatal visits.  Keep all your prenatal visits as told by your health care provider. This is important. Safety  Wear your seat belt at all times when driving.  Make a list of emergency phone numbers, including numbers for family, friends, the hospital, and police and fire departments. General instructions  Ask your health care provider for a referral to a local prenatal education class. Begin classes no later than the beginning of month 6 of your pregnancy.  Ask for help if you have counseling or nutritional needs during pregnancy. Your health care provider can offer advice or refer you to specialists for help  with various needs.  Do not use hot tubs, steam rooms, or saunas.  Do not douche or use tampons or scented sanitary pads.  Do not cross your legs for long periods of time.  Avoid cat litter boxes and soil used by cats. These carry germs that can cause birth defects in the baby and possibly loss of the fetus by miscarriage or stillbirth.  Avoid all smoking, herbs, alcohol, and medicines not prescribed by your health care provider. Chemicals in these products affect the formation and growth of the baby.  Do not use any products that contain nicotine or tobacco, such as cigarettes and e-cigarettes. If you need help quitting, ask your health care provider. You may receive counseling support and other resources to help you quit.  Schedule a dentist appointment. At home, brush your teeth with a soft toothbrush and be gentle when you floss. Contact a health care provider if:  You have dizziness.  You have mild pelvic cramps, pelvic pressure, or nagging pain in the abdominal area.  You have persistent nausea, vomiting, or diarrhea.  You have a bad smelling vaginal discharge.  You have pain when you urinate.  You notice increased swelling in your face, hands, legs, or ankles.  You are exposed to fifth disease or chickenpox.  You are exposed to German measles (rubella) and have never had it. Get help right away if:  You have a fever.  You are leaking fluid from your vagina.  You have spotting or bleeding from your vagina.  You have severe abdominal cramping or pain.  You have rapid weight gain or loss.  You vomit blood or material that looks like coffee grounds.  You develop a severe headache.  You have shortness of breath.  You have any kind of trauma, such as from a fall or a car accident. Summary  The first trimester of pregnancy is from week 1 until the end of week 13 (months 1 through 3).  Your body goes through many changes during pregnancy. The changes vary from  woman to woman.  You will have routine prenatal visits. During those visits, your health care provider will examine you, discuss any test results you may have, and talk with you about how you are feeling. This information is not intended to replace advice given to you by your health care provider. Make sure you discuss any questions you have with your health care provider. Document Released: 08/30/2001 Document Revised: 08/17/2016 Document Reviewed: 08/17/2016 Elsevier Interactive Patient Education  2017 Elsevier   Inc.  

## 2016-11-17 NOTE — Progress Notes (Signed)
Subjective:     Patient ID: Katelyn Martinez, female   DOB: 04/05/1996, 21 y.o.   MRN: [id number]  HPI Judeth CornfieldStephanie is a 21 year old white female in for UPT, has missed a period and had +HPT, no bleeding, some nausea and has vomited x 2.She works at [employer].[facility] and fractured her skull and leg, is doing well.   Review of Systems +missed period Has some nausea and vomited x 2 No bleeding  Reviewed past medical,surgical, social and family history. Reviewed medications and allergies.     Objective:   Physical Exam BP 100/60 (BP Location: Left Arm, Patient Position: Sitting, Cuff Size: Normal)   Pulse 74   Ht 5\' 5"  (1.651 m)   Wt 106 lb (48.1 kg)   LMP 09/26/2016 (Exact Date)   BMI 17.64 kg/m UPT +, about 7+3 weeks by LMP with EDD 07/03/17. Skin warm and dry. Neck: mid line trachea, normal thyroid, good ROM, no lymphadenopathy noted. Lungs: clear to ausculation bilaterally. Cardiovascular: regular rate and rhythm.Abdomen is soft and non tender.   Advised to not smoke THC during pregnancy and she said that was before she found out.  Assessment:     1. Pregnancy examination or test, positive result   2. Pregnancy, unspecified gestational age   163. Encounter to determine fetal viability of pregnancy, single or unspecified fetus       Plan:     Return in 1 week for dating US Review handout on first trimester Eat often

## 2016-11-18 ENCOUNTER — Other Ambulatory Visit: Payer: Self-pay | Admitting: *Deleted

## 2016-11-18 MED ORDER — ALBUTEROL SULFATE HFA 108 (90 BASE) MCG/ACT IN AERS: 2.0000 | INHALATION_SPRAY | Freq: Four times a day (QID) | RESPIRATORY_TRACT | 1 refills | Status: DC | PRN

## 2016-11-18 NOTE — Telephone Encounter (Signed)
Patient called stating her prescription refilled by Dr Katelyn Martinez was not at the pharmacy. I noticed it was printed and I cannot find it. Could you please resend this?

## 2016-11-18 NOTE — Telephone Encounter (Signed)
Prescription faxed

## 2016-11-24 ENCOUNTER — Other Ambulatory Visit: Payer: Medicaid Other

## 2016-12-02 ENCOUNTER — Ambulatory Visit (INDEPENDENT_AMBULATORY_CARE_PROVIDER_SITE_OTHER): Payer: Medicaid Other

## 2016-12-02 DIAGNOSIS — O3680X0 Pregnancy with inconclusive fetal viability, not applicable or unspecified: Secondary | ICD-10-CM

## 2016-12-02 DIAGNOSIS — Z3A10 10 weeks gestation of pregnancy: Secondary | ICD-10-CM | POA: Diagnosis not present

## 2016-12-02 NOTE — Progress Notes (Addendum)
US 9+4 wks,single IUP,pos fht 164 bpm,normal ov's bilat,crl 32.3 mm,EDD 07/03/2017 BY LMP

## 2016-12-05 ENCOUNTER — Telehealth: Payer: Self-pay | Admitting: *Deleted

## 2016-12-05 NOTE — Telephone Encounter (Signed)
Spoke with patient who states she was seen [facility] [place] and was diagnosed with "ITP". States she was to follow-up with us. She has already made an appointment at urgent care to be seen tomorrow.

## 2016-12-05 NOTE — Telephone Encounter (Signed)
Returning call. Unable to leave message.

## 2016-12-14 ENCOUNTER — Telehealth: Payer: Self-pay | Admitting: Obstetrics & Gynecology

## 2016-12-15 ENCOUNTER — Encounter: Payer: Self-pay | Admitting: Women's Health

## 2016-12-15 ENCOUNTER — Ambulatory Visit: Payer: Medicaid Other | Admitting: *Deleted

## 2016-12-15 ENCOUNTER — Ambulatory Visit (INDEPENDENT_AMBULATORY_CARE_PROVIDER_SITE_OTHER): Payer: Medicaid Other | Admitting: Women's Health

## 2016-12-15 VITALS — BP 90/58 | HR 80 | Wt 106.0 lb

## 2016-12-15 DIAGNOSIS — Z862 Personal history of diseases of the blood and blood-forming organs and certain disorders involving the immune mechanism: Secondary | ICD-10-CM

## 2016-12-15 DIAGNOSIS — Z1389 Encounter for screening for other disorder: Secondary | ICD-10-CM

## 2016-12-15 DIAGNOSIS — Z3A11 11 weeks gestation of pregnancy: Secondary | ICD-10-CM | POA: Diagnosis not present

## 2016-12-15 DIAGNOSIS — Z3682 Encounter for antenatal screening for nuchal translucency: Secondary | ICD-10-CM

## 2016-12-15 DIAGNOSIS — Z3401 Encounter for supervision of normal first pregnancy, first trimester: Secondary | ICD-10-CM

## 2016-12-15 DIAGNOSIS — O0990 Supervision of high risk pregnancy, unspecified, unspecified trimester: Secondary | ICD-10-CM | POA: Insufficient documentation

## 2016-12-15 DIAGNOSIS — Z331 Pregnant state, incidental: Secondary | ICD-10-CM

## 2016-12-15 LAB — POCT URINALYSIS DIPSTICK
Blood, UA: NEGATIVE
Glucose, UA: NEGATIVE
Ketones, UA: NEGATIVE
Leukocytes, UA: NEGATIVE
Nitrite, UA: NEGATIVE
Protein, UA: NEGATIVE

## 2016-12-15 MED ORDER — ALBUTEROL SULFATE HFA 108 (90 BASE) MCG/ACT IN AERS
1.0000 | INHALATION_SPRAY | Freq: Four times a day (QID) | RESPIRATORY_TRACT | 1 refills | Status: DC | PRN
Start: 2016-12-15 — End: 2017-06-07
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
## 2016-12-15 NOTE — Patient Instructions (Signed)

## 2016-12-17 LAB — URINE CULTURE: Organism ID, Bacteria: NO GROWTH

## 2016-12-17 LAB — GC/CHLAMYDIA PROBE AMP
Chlamydia trachomatis, NAA: NEGATIVE
Neisseria gonorrhoeae by PCR: NEGATIVE

## 2016-12-19 ENCOUNTER — Encounter: Payer: Self-pay | Admitting: Women's Health

## 2016-12-19 DIAGNOSIS — Z283 Underimmunization status: Secondary | ICD-10-CM

## 2016-12-19 DIAGNOSIS — O09899 Supervision of other high risk pregnancies, unspecified trimester: Secondary | ICD-10-CM | POA: Insufficient documentation

## 2016-12-21 LAB — PMP SCREEN PROFILE (10S), URINE
Amphetamine Screen, Ur: NEGATIVE ng/mL
Barbiturate Screen, Ur: NEGATIVE ng/mL
Benzodiazepine Screen, Urine: NEGATIVE ng/mL
Cannabinoids Ur Ql Scn: NEGATIVE ng/mL
Cocaine(Metab.)Screen, Urine: NEGATIVE ng/mL
Creatinine(Crt), U: 146 mg/dL (ref 20.0–300.0)
Methadone Scn, Ur: NEGATIVE ng/mL
Opiate Scrn, Ur: NEGATIVE ng/mL
Oxycodone+Oxymorphone Ur Ql Scn: NEGATIVE ng/mL
PCP Scrn, Ur: NEGATIVE ng/mL
Ph of Urine: 5.7 (ref 4.5–8.9)
Propoxyphene, Screen: NEGATIVE ng/mL

## 2016-12-21 LAB — URINALYSIS, ROUTINE W REFLEX MICROSCOPIC
Bilirubin, UA: NEGATIVE
Glucose, UA: NEGATIVE
Ketones, UA: NEGATIVE
Leukocytes, UA: NEGATIVE
Nitrite, UA: NEGATIVE
Protein, UA: NEGATIVE
RBC, UA: NEGATIVE
Specific Gravity, UA: 1.023 (ref 1.005–1.030)
Urobilinogen, Ur: 0.2 mg/dL (ref 0.2–1.0)
pH, UA: 6 (ref 5.0–7.5)

## 2016-12-21 LAB — VARICELLA ZOSTER ANTIBODY, IGG: Varicella zoster IgG: 158 index — ABNORMAL LOW (ref >165)

## 2016-12-21 LAB — ABO/RH: Rh Factor: POSITIVE

## 2016-12-21 LAB — RPR: RPR Ser Ql: NONREACTIVE

## 2016-12-21 LAB — CYSTIC FIBROSIS MUTATION 97: Interpretation: NOT DETECTED

## 2016-12-21 LAB — HIV ANTIBODY (ROUTINE TESTING W REFLEX): HIV Screen 4th Generation wRfx: NONREACTIVE

## 2016-12-21 LAB — CBC
Hematocrit: 40.5 % (ref 34.0–46.6)
Hemoglobin: 13.9 g/dL (ref 11.1–15.9)
MCH: 28.5 pg (ref 26.6–33.0)
MCHC: 34.3 g/dL (ref 31.5–35.7)
MCV: 83 fL (ref 79–97)
Platelets: 264 10*3/uL (ref 150–379)
RBC: 4.88 x10E6/uL (ref 3.77–5.28)
RDW: 14.2 % (ref 12.3–15.4)
WBC: 9.9 10*3/uL (ref 3.4–10.8)

## 2016-12-21 LAB — ANTIBODY SCREEN: Antibody Screen: NEGATIVE

## 2016-12-21 LAB — RUBELLA SCREEN: Rubella Antibodies, IGG: 2.43 index (ref >0.99)

## 2016-12-21 LAB — HEPATITIS B SURFACE ANTIGEN: Hepatitis B Surface Ag: NEGATIVE

## 2016-12-23 ENCOUNTER — Other Ambulatory Visit: Payer: Medicaid Other

## 2016-12-23 ENCOUNTER — Ambulatory Visit (INDEPENDENT_AMBULATORY_CARE_PROVIDER_SITE_OTHER): Payer: Medicaid Other

## 2016-12-23 DIAGNOSIS — Z3682 Encounter for antenatal screening for nuchal translucency: Secondary | ICD-10-CM | POA: Diagnosis not present

## 2016-12-23 DIAGNOSIS — Z3401 Encounter for supervision of normal first pregnancy, first trimester: Secondary | ICD-10-CM

## 2016-12-23 NOTE — Progress Notes (Signed)
[place] 12+4 wks,measurements c/w dates,bilat adnexa's wnl,NB present,NT 1.5 mm,fhr 145 bpm,crl 68.9 mm

## 2016-12-27 LAB — MATERNAL SCREEN, INTEGRATED #1
Crown Rump Length: 68.9 mm
Gest. Age on Collection Date: 12.9 weeks
Maternal Age at EDD: 20.4 yr
Nuchal Translucency (NT): 1.5 mm
Number of Fetuses: 1
PAPP-A Value: 597.2 ng/mL
Weight: 108 [lb_av]

## 2016-12-28 ENCOUNTER — Telehealth: Payer: Self-pay | Admitting: Obstetrics & Gynecology

## 2016-12-28 NOTE — Telephone Encounter (Signed)
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  She states she had breakfast before going to work this morning and has been drinking a lot of water, when felt light headed she sat down for a few minutes and feeling did not go away so she left work.  Advised pt to eat small frequent meals that include carbs and protein and drink lots of water and rest when can.  Advised pt if symptoms continue call us back but most of the time her symptoms are related to blood sugar dropping and eating frequently should help.  Pt verbalized understanding.

## 2017-01-10 ENCOUNTER — Encounter: Payer: Self-pay | Admitting: Advanced Practice Midwife

## 2017-01-10 ENCOUNTER — Telehealth: Payer: Self-pay | Admitting: *Deleted

## 2017-01-10 ENCOUNTER — Other Ambulatory Visit: Payer: Self-pay | Admitting: Advanced Practice Midwife

## 2017-01-10 MED ORDER — BUTALBITAL-APAP-CAFFEINE 50-325-40 MG PO TABS: 1.0000 | ORAL_TABLET | Freq: Four times a day (QID) | ORAL | 0 refills | Status: DC | PRN
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
## 2017-01-13 ENCOUNTER — Ambulatory Visit (INDEPENDENT_AMBULATORY_CARE_PROVIDER_SITE_OTHER): Payer: Medicaid Other | Admitting: Women's Health

## 2017-01-13 ENCOUNTER — Encounter: Payer: Self-pay | Admitting: Women's Health

## 2017-01-13 VITALS — BP 90/62 | HR 89 | Wt 109.0 lb

## 2017-01-13 DIAGNOSIS — Z331 Pregnant state, incidental: Secondary | ICD-10-CM

## 2017-01-13 DIAGNOSIS — Z3402 Encounter for supervision of normal first pregnancy, second trimester: Secondary | ICD-10-CM

## 2017-01-13 DIAGNOSIS — Z363 Encounter for antenatal screening for malformations: Secondary | ICD-10-CM

## 2017-01-13 DIAGNOSIS — Z3682 Encounter for antenatal screening for nuchal translucency: Secondary | ICD-10-CM

## 2017-01-13 DIAGNOSIS — Z1389 Encounter for screening for other disorder: Secondary | ICD-10-CM

## 2017-01-13 LAB — POCT URINALYSIS DIPSTICK
Blood, UA: NEGATIVE
Glucose, UA: NEGATIVE
Ketones, UA: NEGATIVE
Leukocytes, UA: NEGATIVE
Nitrite, UA: NEGATIVE
Protein, UA: NEGATIVE

## 2017-01-13 NOTE — Patient Instructions (Signed)

## 2017-01-13 NOTE — Progress Notes (Signed)
Low-risk OB appointment G1P0 [redacted]w[redacted]d Estimated Date of Delivery: 07/03/17 LMP 09/26/2016 (Exact Date)   BP, weight, and urine reviewed.  Refer to obstetrical flow sheet for FH & FHR.  No fm yet. Denies cramping, lof, vb, or uti s/s. No complaints. Reviewed warning s/s to report. Plan:  Continue routine obstetrical care  F/U in 3wks for OB appointment and anatomy u/s 2nd IT today

## 2017-01-18 LAB — MATERNAL SCREEN, INTEGRATED #2
AFP MoM: 1.33
Alpha-Fetoprotein: 48.1 ng/mL
Crown Rump Length: 68.9 mm
DIA MoM: 0.94
DIA Value: 197 pg/mL
Estriol, Unconjugated: 1.06 ng/mL
Gest. Age on Collection Date: 12.9 weeks
Gestational Age: 15.9 weeks
Maternal Age at EDD: 20.4 yr
Nuchal Translucency (NT): 1.5 mm
Nuchal Translucency MoM: 0.88
Number of Fetuses: 1
PAPP-A MoM: 0.38
PAPP-A Value: 597.2 ng/mL
Test Results:: NEGATIVE
Weight: 108 [lb_av]
Weight: 109 [lb_av]
hCG MoM: 1.11
hCG Value: 46.2 IU/mL
uE3 MoM: 1.34

## 2017-02-03 ENCOUNTER — Ambulatory Visit (INDEPENDENT_AMBULATORY_CARE_PROVIDER_SITE_OTHER): Payer: Medicaid Other

## 2017-02-03 ENCOUNTER — Ambulatory Visit (INDEPENDENT_AMBULATORY_CARE_PROVIDER_SITE_OTHER): Payer: Medicaid Other | Admitting: Obstetrics and Gynecology

## 2017-02-03 ENCOUNTER — Encounter: Payer: Self-pay | Admitting: Obstetrics and Gynecology

## 2017-02-03 VITALS — BP 96/56 | HR 78 | Wt 113.0 lb

## 2017-02-03 DIAGNOSIS — Z1389 Encounter for screening for other disorder: Secondary | ICD-10-CM

## 2017-02-03 DIAGNOSIS — Z3402 Encounter for supervision of normal first pregnancy, second trimester: Secondary | ICD-10-CM

## 2017-02-03 DIAGNOSIS — Z331 Pregnant state, incidental: Secondary | ICD-10-CM

## 2017-02-03 DIAGNOSIS — Z363 Encounter for antenatal screening for malformations: Secondary | ICD-10-CM

## 2017-02-03 LAB — POCT URINALYSIS DIPSTICK
Blood, UA: NEGATIVE
Glucose, UA: NEGATIVE
Ketones, UA: NEGATIVE
Leukocytes, UA: NEGATIVE
Nitrite, UA: NEGATIVE
Protein, UA: NEGATIVE

## 2017-02-03 NOTE — Progress Notes (Signed)
Katelyn Martinez is a 21 y.o. female  G1P0  Estimated Date of Delivery: 07/03/17 LROB 2172w4d  Chief Complaint  Patient presents with  . Routine Prenatal Visit  ____  Patient has no acute complaints at this time. Patient denies any bleeding , rupture of membranes,or regular contractions.  Last menstrual period 09/26/2016.   Urine results: NEGATIVE refer to the ob flow sheet for FH and FHR, ,                          Physical Examination: General appearance - alert, well appearing, and in no distress                                      Abdomen -                                                         -FHR 156 bpm                                                         soft, nontender, nondistended, no masses or organomegaly                                      Pelvic - examination not indicated                                            Questions were answered Assessment: LROB G1P0 @ 3172w4d  Plan:  Continued routine obstetrical care  F/u in 4 weeks for routine  By signing my name below, I, Freida Busmaniana Omoyeni, attest that this documentation has been prepared under the direction and in the presence of Tilda BurrowJohn V Aubryana Vittorio, MD . Electronically Signed: Freida Busmaniana Omoyeni, Scribe. 02/03/2017. 1:45 PM. I personally performed the services described in this documentation, which was SCRIBED in my presence. The recorded information has been reviewed and considered accurate. It has been edited as necessary during review. Tilda BurrowFERGUSON,Mykaela Arena V, MD

## 2017-02-03 NOTE — Progress Notes (Signed)
US 18+4 wks,cephalic,cx 3.6 cm,post pl gr 0,normal ovaries bilat,fhr 148 bpm,svp of fluid 3.8 cm,efw 268 g,anatomy complete,no obvious abnormalities seen

## 2017-03-03 ENCOUNTER — Ambulatory Visit (INDEPENDENT_AMBULATORY_CARE_PROVIDER_SITE_OTHER): Payer: Medicaid Other | Admitting: Obstetrics and Gynecology

## 2017-03-03 ENCOUNTER — Encounter: Payer: Self-pay | Admitting: Obstetrics and Gynecology

## 2017-03-03 VITALS — BP 112/58 | HR 83 | Wt 116.0 lb

## 2017-03-03 DIAGNOSIS — Z1389 Encounter for screening for other disorder: Secondary | ICD-10-CM

## 2017-03-03 DIAGNOSIS — K5901 Slow transit constipation: Secondary | ICD-10-CM

## 2017-03-03 DIAGNOSIS — Z331 Pregnant state, incidental: Secondary | ICD-10-CM

## 2017-03-03 DIAGNOSIS — Z3402 Encounter for supervision of normal first pregnancy, second trimester: Secondary | ICD-10-CM

## 2017-03-03 DIAGNOSIS — K219 Gastro-esophageal reflux disease without esophagitis: Secondary | ICD-10-CM

## 2017-03-03 LAB — POCT URINALYSIS DIPSTICK
Blood, UA: NEGATIVE
Glucose, UA: NEGATIVE
Ketones, UA: NEGATIVE
Leukocytes, UA: NEGATIVE
Nitrite, UA: NEGATIVE
Protein, UA: NEGATIVE

## 2017-03-03 MED ORDER — POLYETHYLENE GLYCOL 3350 17 GM/SCOOP PO POWD: 1.0000 | Freq: Once | ORAL | Status: DC

## 2017-03-03 MED ORDER — OMEPRAZOLE 20 MG PO CPDR: 20.0000 mg | DELAYED_RELEASE_CAPSULE | Freq: Every day | ORAL | 1 refills | Status: DC

## 2017-03-03 NOTE — Progress Notes (Signed)
Katelyn Martinez is a 21 y.o. female  G1P0  Estimated Date of Delivery: 07/03/17 LROB 126w4d  Chief Complaint  Patient presents with  . Routine Prenatal Visit  ____  Patient complaints: constipation and heart burn. No alleviating factors noted.  Patient reports good fetal movement; denies any bleeding , rupture of membranes,or regular contractions.  Blood pressure (!) 112/58, pulse 83, weight 116 lb (52.6 kg), last menstrual period 09/26/2016.   Urine results: negative  refer to the ob flow sheet for FH and FHR, ,                          Physical Examination: General appearance - alert, well appearing, and in no distress and oriented to person, place, and time                                      Abdomen - FH 23 cm ,                                                         -FHR 143 bpm                                                         soft, nontender                                    Pelvic - examination not indicated                                            Questions were answered. Assessment: LROB G1P0 @ 206w4d                          Constipation                         GERD Plan:  Continued routine obstetrical care; advised to take miralax and omeprazole  F/u in 4 weeks for routine   By signing my name below, I, Freida Busmaniana Omoyeni, attest that this documentation has been prepared under the direction and in the presence of Tilda BurrowJohn V Jolly Carlini, MD . Electronically Signed: Freida Busmaniana Omoyeni, Scribe. 03/03/2017. 12:25 PM. I personally performed the services described in this documentation, which was SCRIBED in my presence. The recorded information has been reviewed and considered accurate. It has been edited as necessary during review. Tilda BurrowFERGUSON,Treyana Sturgell V, MD

## 2017-03-03 NOTE — Patient Instructions (Signed)
Please check out [email or web address]   for more information on childbirth classes   

## 2017-03-30 ENCOUNTER — Encounter: Payer: Self-pay | Admitting: Women's Health

## 2017-03-30 ENCOUNTER — Ambulatory Visit (INDEPENDENT_AMBULATORY_CARE_PROVIDER_SITE_OTHER): Payer: Medicaid Other | Admitting: Women's Health

## 2017-03-30 VITALS — BP 100/72 | HR 74 | Wt 121.5 lb

## 2017-03-30 DIAGNOSIS — Z1389 Encounter for screening for other disorder: Secondary | ICD-10-CM

## 2017-03-30 DIAGNOSIS — Z331 Pregnant state, incidental: Secondary | ICD-10-CM

## 2017-03-30 DIAGNOSIS — Z3A26 26 weeks gestation of pregnancy: Secondary | ICD-10-CM

## 2017-03-30 DIAGNOSIS — Z3402 Encounter for supervision of normal first pregnancy, second trimester: Secondary | ICD-10-CM

## 2017-03-30 LAB — POCT URINALYSIS DIPSTICK
Glucose, UA: NEGATIVE
Ketones, UA: NEGATIVE
Leukocytes, UA: NEGATIVE
Nitrite, UA: NEGATIVE
Protein, UA: NEGATIVE

## 2017-03-30 NOTE — Progress Notes (Signed)
Low-risk OB appointment G1P0 9166w3d Estimated Date of Delivery: 07/03/17 BP 100/72   Pulse 74   Wt 121 lb 8 oz (55.1 kg)   LMP 09/26/2016 (Exact Date)   BMI 20.22 kg/m   BP, weight, and urine reviewed.  Refer to obstetrical flow sheet for FH & FHR.  Reports good fm.  Denies regular uc's, lof, vb, or uti s/s. No complaints. Reviewed ptl s/s, fm. Plan:  Continue routine obstetrical care  F/U in 2wks for OB appointment and pn2

## 2017-03-30 NOTE — Patient Instructions (Signed)
You will have your sugar test next visit.  Please do not eat or drink anything after midnight the night before you come, not even water.  You will be here for at least two hours.     Call the office ([phone]) or go to [facility] if:  You begin to have strong, frequent contractions  Your water breaks.  Sometimes it is a big gush of fluid, sometimes it is just a trickle that keeps getting your panties wet or running down your legs  You have vaginal bleeding.  It is normal to have a small amount of spotting if your cervix was checked.   You don't feel your baby moving like normal.  If you don't, get you something to eat and drink and lay down and focus on feeling your baby move.   If your baby is still not moving like normal, you should call the office or go to [facility].  Second Trimester of Pregnancy The second trimester is from week 13 through week 28, months 4 through 6. The second trimester is often a time when you feel your best. Your body has also adjusted to being pregnant, and you begin to feel better physically. Usually, morning sickness has lessened or quit completely, you may have more energy, and you may have an increase in appetite. The second trimester is also a time when the fetus is growing rapidly. At the end of the sixth month, the fetus is about 9 inches long and weighs about 1 pounds. You will likely begin to feel the baby move (quickening) between 18 and 20 weeks of the pregnancy. BODY CHANGES Your body goes through many changes during pregnancy. The changes vary from woman to woman.   Your weight will continue to increase. You will notice your lower abdomen bulging out.  You may begin to get stretch marks on your hips, abdomen, and breasts.  You may develop headaches that can be relieved by medicines approved by your health care provider.  You may urinate more often because the fetus is pressing on your bladder.  You may develop or continue to have  heartburn as a result of your pregnancy.  You may develop constipation because certain hormones are causing the muscles that push waste through your intestines to slow down.  You may develop hemorrhoids or swollen, bulging veins (varicose veins).  You may have back pain because of the weight gain and pregnancy hormones relaxing your joints between the bones in your pelvis and as a result of a shift in weight and the muscles that support your balance.  Your breasts will continue to grow and be tender.  Your gums may bleed and may be sensitive to brushing and flossing.  Dark spots or blotches (chloasma, mask of pregnancy) may develop on your face. This will likely fade after the baby is born.  A dark line from your belly button to the pubic area (linea nigra) may appear. This will likely fade after the baby is born.  You may have changes in your hair. These can include thickening of your hair, rapid growth, and changes in texture. Some women also have hair loss during or after pregnancy, or hair that feels dry or thin. Your hair will most likely return to normal after your baby is born. WHAT TO EXPECT AT YOUR PRENATAL VISITS During a routine prenatal visit:  You will be weighed to make sure you and the fetus are growing normally.  Your blood pressure will be taken.    Your abdomen will be measured to track your baby's growth.  The fetal heartbeat will be listened to.  Any test results from the previous visit will be discussed. Your health care provider may ask you:  How you are feeling.  If you are feeling the baby move.  If you have had any abnormal symptoms, such as leaking fluid, bleeding, severe headaches, or abdominal cramping.  If you have any questions. Other tests that may be performed during your second trimester include:  Blood tests that check for:  Low iron levels (anemia).  Gestational diabetes (between 24 and 28 weeks).  Rh antibodies.  Urine tests to check  for infections, diabetes, or protein in the urine.  An ultrasound to confirm the proper growth and development of the baby.  An amniocentesis to check for possible genetic problems.  Fetal screens for spina bifida and Down syndrome. HOME CARE INSTRUCTIONS   Avoid all smoking, herbs, alcohol, and unprescribed drugs. These chemicals affect the formation and growth of the baby.  Follow your health care provider's instructions regarding medicine use. There are medicines that are either safe or unsafe to take during pregnancy.  Exercise only as directed by your health care provider. Experiencing uterine cramps is a good sign to stop exercising.  Continue to eat regular, healthy meals.  Wear a good support bra for breast tenderness.  Do not use hot tubs, steam rooms, or saunas.  Wear your seat belt at all times when driving.  Avoid raw meat, uncooked cheese, cat litter boxes, and soil used by cats. These carry germs that can cause birth defects in the baby.  Take your prenatal vitamins.  Try taking a stool softener (if your health care provider approves) if you develop constipation. Eat more high-fiber foods, such as fresh vegetables or fruit and whole grains. Drink plenty of fluids to keep your urine clear or pale yellow.  Take warm sitz baths to soothe any pain or discomfort caused by hemorrhoids. Use hemorrhoid cream if your health care provider approves.  If you develop varicose veins, wear support hose. Elevate your feet for 15 minutes, 3-4 times a day. Limit salt in your diet.  Avoid heavy lifting, wear low heel shoes, and practice good posture.  Rest with your legs elevated if you have leg cramps or low back pain.  Visit your dentist if you have not gone yet during your pregnancy. Use a soft toothbrush to brush your teeth and be gentle when you floss.  A sexual relationship may be continued unless your health care provider directs you otherwise.  Continue to go to all your  prenatal visits as directed by your health care provider. SEEK MEDICAL CARE IF:   You have dizziness.  You have mild pelvic cramps, pelvic pressure, or nagging pain in the abdominal area.  You have persistent nausea, vomiting, or diarrhea.  You have a bad smelling vaginal discharge.  You have pain with urination. SEEK IMMEDIATE MEDICAL CARE IF:   You have a fever.  You are leaking fluid from your vagina.  You have spotting or bleeding from your vagina.  You have severe abdominal cramping or pain.  You have rapid weight gain or loss.  You have shortness of breath with chest pain.  You notice sudden or extreme swelling of your face, hands, ankles, feet, or legs.  You have not felt your baby move in over an hour.  You have severe headaches that do not go away with medicine.  You have vision changes.   Document Released: 08/30/2001 Document Revised: 09/10/2013 Document Reviewed: 11/06/2012 Exit[employer] Patient Information 2015 [employer]. This information is not intended to replace advice given to you by your health care provider. Make sure you discuss any questions you have with your health care provider.     

## 2017-04-12 ENCOUNTER — Inpatient Hospital Stay (HOSPITAL_COMMUNITY)
Admission: AD | Admit: 2017-04-12 | Discharge: 2017-04-13 | Disposition: A | Discharge status: Home / Self Care | Payer: Medicaid Other | Source: Ambulatory Visit | Attending: Obstetrics & Gynecology | Admitting: Obstetrics & Gynecology

## 2017-04-12 ENCOUNTER — Encounter (HOSPITAL_COMMUNITY): Payer: Self-pay | Admitting: *Deleted

## 2017-04-12 DIAGNOSIS — O4703 False labor before 37 completed weeks of gestation, third trimester: Secondary | ICD-10-CM | POA: Diagnosis not present

## 2017-04-12 DIAGNOSIS — J45909 Unspecified asthma, uncomplicated: Secondary | ICD-10-CM | POA: Diagnosis not present

## 2017-04-12 DIAGNOSIS — Z7722 Contact with and (suspected) exposure to environmental tobacco smoke (acute) (chronic): Secondary | ICD-10-CM | POA: Insufficient documentation

## 2017-04-12 DIAGNOSIS — Z3A28 28 weeks gestation of pregnancy: Secondary | ICD-10-CM | POA: Diagnosis not present

## 2017-04-12 DIAGNOSIS — Z79899 Other long term (current) drug therapy: Secondary | ICD-10-CM | POA: Diagnosis not present

## 2017-04-12 DIAGNOSIS — O99513 Diseases of the respiratory system complicating pregnancy, third trimester: Secondary | ICD-10-CM | POA: Insufficient documentation

## 2017-04-12 LAB — FETAL FIBRONECTIN: Fetal Fibronectin: NEGATIVE

## 2017-04-12 LAB — URINALYSIS, ROUTINE W REFLEX MICROSCOPIC
Bilirubin Urine: NEGATIVE
Glucose, UA: NEGATIVE mg/dL
Hgb urine dipstick: NEGATIVE
Ketones, ur: NEGATIVE mg/dL
Leukocytes, UA: NEGATIVE
Nitrite: NEGATIVE
Protein, ur: NEGATIVE mg/dL
Specific Gravity, Urine: 1.014 (ref 1.005–1.030)
pH: 8 (ref 5.0–8.0)

## 2017-04-12 MED ORDER — BETAMETHASONE SOD PHOS & ACET 6 (3-3) MG/ML IJ SUSP
12.0000 mg | Freq: Once | INTRAMUSCULAR | Status: AC
  Administered 2017-04-12: 12 mg via INTRAMUSCULAR
  Filled 2017-04-12: qty 2

## 2017-04-12 MED ORDER — NIFEDIPINE 10 MG PO CAPS
10.0000 mg | ORAL_CAPSULE | ORAL | Status: AC
  Administered 2017-04-12 (×2): 10 mg via ORAL
  Filled 2017-04-12 (×2): qty 1

## 2017-04-12 MED ORDER — NIFEDIPINE ER OSMOTIC RELEASE 30 MG PO TB24: 30.0000 mg | ORAL_TABLET | Freq: Two times a day (BID) | ORAL | 1 refills | Status: DC | PRN

## 2017-04-12 MED ORDER — TERBUTALINE SULFATE 1 MG/ML IJ SOLN
0.2500 mg | Freq: Once | INTRAMUSCULAR | Status: AC
  Administered 2017-04-12: 0.25 mg via SUBCUTANEOUS
  Filled 2017-04-12: qty 1

## 2017-04-12 MED ORDER — NIFEDIPINE 10 MG PO CAPS
10.0000 mg | ORAL_CAPSULE | Freq: Once | ORAL | Status: AC
  Administered 2017-04-12: 10 mg via ORAL
  Filled 2017-04-12: qty 1

## 2017-04-12 MED ORDER — LACTATED RINGERS IV BOLUS (SEPSIS)
1000.0000 mL | Freq: Once | INTRAVENOUS | Status: AC
  Administered 2017-04-12: 1000 mL via INTRAVENOUS

## 2017-04-12 NOTE — Discharge Instructions (Signed)

## 2017-04-12 NOTE — MAU Provider Note (Addendum)
History     CSN: [id number]  Arrival date and time: 04/12/17 1909   None     Chief Complaint  Patient presents with  . Contractions   HPI 21 yo G1P0 at 6033w2d here for evaluation of contractions every 10 minutes for the past 2 hours. Patient [facility]com[facility]. She denies any abdominal pain but reports intermittent abdominal tightening. She reports good fetal movement, denies leakage of fluid or vaginal bleeding. She admits to limited fluid intake today. She admits to vaginal intercourse yesterday evening.   OB History    Gravida Para Term Preterm AB Living   1             SAB TAB Ectopic Multiple Live Births                  Past Medical History:  Diagnosis Date  . Asthma   . 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 Trauma 12/22/2014   [facility]     Past Surgical History:  Procedure Laterality Date  . BLADDER SURGERY     uteral reimplantation  . ORIF TIBIA PLATEAU Right 12/29/2014   Procedure: OPEN REDUCTION INTERNAL FIXATION (ORIF) TIBIAL PLATEAU;  Surgeon: Sheral Apleyimothy D Murphy, MD;  Location: [facility] OR;  Service: Orthopedics;  Laterality: Right;  . SALIVARY GLAND SURGERY    . TONSILLECTOMY      Family History  Problem Relation Age of Onset  . Ovarian cysts Mother   . Endometriosis Mother   . Fibromyalgia Mother   . Hepatitis C Mother   . Diabetes Mother   . Cancer Maternal Aunt        ovarian  . Cervical cancer Maternal Aunt   . Cancer Maternal Grandfather        bladder  . Cancer Paternal Grandmother        lung  . Bipolar disorder Father   . Suicidality Paternal Grandfather   . Cancer Maternal Grandmother   . Other Sister        mood disorder; patella femoral syndrome    Social History  Substance Use Topics  . Smoking status: Passive Smoke Exposure - Never Smoker    Types: Cigarettes  . Smokeless tobacco: Never Used  . Alcohol use No    Allergies: No Known  Allergies  Facility-Administered Medications Prior to Admission  Medication Dose Route Frequency Provider Last Rate Last Dose  . polyethylene glycol powder (GLYCOLAX/MIRALAX) container 255 g  1 Container Oral Once Tilda BurrowFerguson, John V, MD       Prescriptions Prior to Admission  Medication Sig Dispense Refill Last Dose  . albuterol (PROAIR HFA) 108 (90 Base) MCG/ACT inhaler Inhale 1-2 puffs into the lungs every 6 (six) hours as needed for wheezing. 1 Inhaler 1 Taking  . butalbital-acetaminophen-caffeine (FIORICET, ESGIC) 50-325-40 MG tablet Take 1 tablet by mouth every 6 (six) hours as needed for headache. 20 tablet 0 Taking  . omeprazole (PRILOSEC) 20 MG capsule Take 1 capsule (20 mg total) by mouth daily. (Patient taking differently: Take 20 mg by mouth as needed. ) 30 capsule 1 Taking  . Prenatal MV-Min-FA-Omega-3 (PRENATAL GUMMIES/DHA & FA) 0.4-32.5 MG CHEW Chew 2 each by mouth daily.   Taking    Review of Systems  See pertinent in HPI Physical Exam   Blood pressure 104/63, pulse 93, temperature 97.8 F (36.6 C), temperature source Oral, resp. rate 18, height 5\' 5"  (1.651 m), weight  122 lb (55.3 kg), last menstrual period 09/26/2016, SpO2 97 %.  Physical Exam GENERAL: Well-developed, well-nourished female in no acute distress.  LUNGS: Clear to auscultation bilaterally.  HEART: Regular rate and rhythm. ABDOMEN: Soft, nontender, nondistended.  PELVIC: Normal external female genitalia. Vagina is pink and rugated.  Normal discharge. Normal appearing cervix. Cervix is closed, thick and long EXTREMITIES: No cyanosis, clubbing, or edema, 2+ distal pulses.   Results for orders placed or performed during the hospital encounter of 04/12/17 (from the past 24 hour(s))  Urinalysis, Routine w reflex microscopic     Status: Abnormal   Collection Time: 04/12/17  7:18 PM  Result Value Ref Range   Color, Urine YELLOW YELLOW   APPearance HAZY (A) CLEAR   Specific Gravity, Urine 1.014 1.005 - 1.030    pH 8.0 5.0 - 8.0   Glucose, UA NEGATIVE NEGATIVE mg/dL   Hgb urine dipstick NEGATIVE NEGATIVE   Bilirubin Urine NEGATIVE NEGATIVE   Ketones, ur NEGATIVE NEGATIVE mg/dL   Protein, ur NEGATIVE NEGATIVE mg/dL   Nitrite NEGATIVE NEGATIVE   Leukocytes, UA NEGATIVE NEGATIVE  Fetal fibronectin     Status: None   Collection Time: 04/12/17 10:00 PM  Result Value Ref Range   Fetal Fibronectin NEGATIVE NEGATIVE    MAU Course  Procedures  MDM Po hydration and IVF bolus provided Procardia 10mg  given FHT: baseline 130, mod variability, +accels, no decels Toco: ctx q3-4 minutes  FFN collected prior to digital exam. Sent at 10pm 2325: Patient has had 3 doses of procardia. She is still contracting about every 2 mins. Will give dose of terbutaline at this time.  Cervical exam unchanged: Closed/thick/firm/posterior/ballotable.  0002: Contractions stopped after terbutaline. Will dc home now.  Assessment and Plan  21 yo G1P0 at 5371w2d with preterm contractions - [facility] Lore[facility] CNM  Peggy Constant 04/12/2017, 7:41 PM   1. Threatened preterm labor, third trimester   2. [redacted] weeks gestation of pregnancy    DC home Comfort measures reviewed  3rd Trimester precautions  PTL precautions  Fetal kick counts RX: procardia 30MG  XR BID PRN contractions  Return to MAU as needed FU with OB as planned Return tomorrow for 2nd dose of BMZ.  Follow-up Information    [facility]   Specialty:  Obstetrics and Gynecology Contact information: [address] [place] [phone]

## 2017-04-12 NOTE — MAU Note (Signed)
Pt here with c/o contractions since about 1600 today. Denies any bleeding or leaking of fluid. Reports good fetal movement.

## 2017-04-13 ENCOUNTER — Ambulatory Visit (INDEPENDENT_AMBULATORY_CARE_PROVIDER_SITE_OTHER): Payer: Medicaid Other | Admitting: Obstetrics and Gynecology

## 2017-04-13 ENCOUNTER — Other Ambulatory Visit: Payer: Medicaid Other

## 2017-04-13 ENCOUNTER — Encounter: Payer: Self-pay | Admitting: Obstetrics and Gynecology

## 2017-04-13 ENCOUNTER — Inpatient Hospital Stay (HOSPITAL_COMMUNITY)
Admission: AD | Admit: 2017-04-13 | Discharge: 2017-04-13 | Disposition: A | Discharge status: Home / Self Care | Payer: Medicaid Other | Source: Ambulatory Visit | Attending: Obstetrics and Gynecology | Admitting: Obstetrics and Gynecology

## 2017-04-13 VITALS — BP 98/54 | HR 72 | Wt 123.0 lb

## 2017-04-13 DIAGNOSIS — Z3A28 28 weeks gestation of pregnancy: Secondary | ICD-10-CM

## 2017-04-13 DIAGNOSIS — Z331 Pregnant state, incidental: Secondary | ICD-10-CM

## 2017-04-13 DIAGNOSIS — Z3402 Encounter for supervision of normal first pregnancy, second trimester: Secondary | ICD-10-CM

## 2017-04-13 DIAGNOSIS — Z1389 Encounter for screening for other disorder: Secondary | ICD-10-CM

## 2017-04-13 DIAGNOSIS — Z131 Encounter for screening for diabetes mellitus: Secondary | ICD-10-CM

## 2017-04-13 DIAGNOSIS — Z3403 Encounter for supervision of normal first pregnancy, third trimester: Secondary | ICD-10-CM

## 2017-04-13 LAB — POCT URINALYSIS DIPSTICK
Blood, UA: NEGATIVE
Glucose, UA: NEGATIVE
Leukocytes, UA: NEGATIVE
Nitrite, UA: NEGATIVE
Protein, UA: NEGATIVE

## 2017-04-13 MED ORDER — BETAMETHASONE SOD PHOS & ACET 6 (3-3) MG/ML IJ SUSP
12.0000 mg | Freq: Once | INTRAMUSCULAR | Status: AC
  Administered 2017-04-13: 12 mg via INTRAMUSCULAR
  Filled 2017-04-13: qty 2

## 2017-04-13 NOTE — MAU Note (Signed)
Pt here for second dose of BMZ. Pt states she feels much better than she did last night. Pt denies vag bleeding, contractions or LOF. Reports good fetal movement.

## 2017-04-13 NOTE — Progress Notes (Signed)
Katelyn Martinez is a 21 y.o. female G1P0  Estimated Date of Delivery: 07/03/17 LROB 5730w3d  Chief Complaint  Patient presents with  . Routine Prenatal Visit    PN2 labs today  ____  Patient was seen last night for evaluation on contractions every 10 minutes for 2 hours. Pt had vaginal intercourse yesterday evening. She was prescribed Procardia 30 mg XR BID PRN contractions, with relief. She has no pertinent complaints otherwise.  Patient reports  good fetal movement,                           denies any bleeding , rupture of membranes,or regular contractions.  Blood pressure (!) 98/54, pulse 72, weight 123 lb (55.8 kg), last menstrual period 09/26/2016.   Urine results:notable for moderate ketones refer to the ob flow sheet for FH and FHR, ,                          Physical Examination: General appearance - alert, well appearing, and in no distress                                      Abdomen - FH 29 cm ,                                                         -FHR 130 bpm                                                         soft, nontender, nondistended, no masses or organomegaly                                      Pelvic - not indicated                                            Questions were answered. Assessment: LROB G1P0 @ 3230w3d Estimated Date of Delivery: 07/03/17  Plan:  Continued routine obstetrical care, LROB  F/u in 2 weeks for LROB   By signing my name below, I, Izna Ahmed, attest that this documentation has been prepared under the direction and in the presence of Tilda BurrowFerguson, Simeon Vera V, MD. Electronically Signed: Redge GainerIzna Ahmed, ED Scribe. 04/13/17. 10:06 AM.  I personally performed the services described in this documentation, which was SCRIBED in my presence. The recorded information has been reviewed and considered accurate. It has been edited as necessary during review. Tilda BurrowFERGUSON,Kaedence Connelly V, MD

## 2017-04-14 LAB — CBC
Hematocrit: 37 % (ref 34.0–46.6)
Hemoglobin: 12 g/dL (ref 11.1–15.9)
MCH: 29 pg (ref 26.6–33.0)
MCHC: 32.4 g/dL (ref 31.5–35.7)
MCV: 89 fL (ref 79–97)
Platelets: 240 10*3/uL (ref 150–379)
RBC: 4.14 x10E6/uL (ref 3.77–5.28)
RDW: 14.3 % (ref 12.3–15.4)
WBC: 13.6 10*3/uL — ABNORMAL HIGH (ref 3.4–10.8)

## 2017-04-14 LAB — GLUCOSE TOLERANCE, 2 HOURS W/ 1HR
Glucose, 1 hour: 285 mg/dL — ABNORMAL HIGH (ref 65–179)
Glucose, 2 hour: 280 mg/dL — ABNORMAL HIGH (ref 65–152)
Glucose, Fasting: 113 mg/dL — ABNORMAL HIGH (ref 65–91)

## 2017-04-14 LAB — HIV ANTIBODY (ROUTINE TESTING W REFLEX): HIV Screen 4th Generation wRfx: NONREACTIVE

## 2017-04-14 LAB — RPR: RPR Ser Ql: NONREACTIVE

## 2017-04-14 LAB — ANTIBODY SCREEN: Antibody Screen: NEGATIVE

## 2017-04-17 ENCOUNTER — Encounter: Payer: Self-pay | Admitting: Women's Health

## 2017-04-17 ENCOUNTER — Telehealth: Payer: Self-pay | Admitting: *Deleted

## 2017-04-17 NOTE — Telephone Encounter (Signed)
Informed patient that glucola needs to be repeated due to receiving BMZ the day before. States she would rather come this Friday and not the day of her appointment because she is busy. Advised to not eat or drink anything after midnight on Thursday. Verbalized understanding.

## 2017-04-21 ENCOUNTER — Other Ambulatory Visit: Payer: Medicaid Other

## 2017-04-21 DIAGNOSIS — Z131 Encounter for screening for diabetes mellitus: Secondary | ICD-10-CM

## 2017-04-22 LAB — GLUCOSE TOLERANCE, 2 HOURS W/ 1HR
Glucose, 1 hour: 203 mg/dL — ABNORMAL HIGH (ref 65–179)
Glucose, 2 hour: 133 mg/dL (ref 65–152)
Glucose, Fasting: 69 mg/dL (ref 65–91)

## 2017-04-24 ENCOUNTER — Telehealth: Payer: Self-pay | Admitting: Women's Health

## 2017-04-24 ENCOUNTER — Encounter: Payer: Self-pay | Admitting: Women's Health

## 2017-04-24 DIAGNOSIS — Z8632 Personal history of gestational diabetes: Secondary | ICD-10-CM | POA: Insufficient documentation

## 2017-04-24 DIAGNOSIS — O24410 Gestational diabetes mellitus in pregnancy, diet controlled: Secondary | ICD-10-CM

## 2017-04-24 NOTE — Telephone Encounter (Signed)
Spoke with pt letting her know about her abnormal sugar test. Pt states dietician called her today and scheduled an appt. JSY

## 2017-04-24 NOTE — Telephone Encounter (Signed)
Patient called back stating that she was returning a phone call, please contact pt

## 2017-04-24 NOTE — Telephone Encounter (Signed)
VM box not set up. Need to notify of GDM, ordered dietician referral today.  Cheral MarkerKimberly R. Bingham Millette, CNM, WHNP-BC 04/24/2017 4:00 PM

## 2017-04-28 ENCOUNTER — Encounter: Payer: Self-pay | Admitting: Obstetrics & Gynecology

## 2017-04-28 ENCOUNTER — Ambulatory Visit (INDEPENDENT_AMBULATORY_CARE_PROVIDER_SITE_OTHER): Payer: Medicaid Other | Admitting: Obstetrics & Gynecology

## 2017-04-28 VITALS — BP 102/72 | HR 87 | Wt 125.0 lb

## 2017-04-28 DIAGNOSIS — Z1389 Encounter for screening for other disorder: Secondary | ICD-10-CM

## 2017-04-28 DIAGNOSIS — Z3403 Encounter for supervision of normal first pregnancy, third trimester: Secondary | ICD-10-CM

## 2017-04-28 DIAGNOSIS — Z331 Pregnant state, incidental: Secondary | ICD-10-CM

## 2017-04-28 DIAGNOSIS — O09893 Supervision of other high risk pregnancies, third trimester: Secondary | ICD-10-CM

## 2017-04-28 DIAGNOSIS — O24410 Gestational diabetes mellitus in pregnancy, diet controlled: Secondary | ICD-10-CM

## 2017-04-28 DIAGNOSIS — Z3A30 30 weeks gestation of pregnancy: Secondary | ICD-10-CM

## 2017-04-28 LAB — POCT URINALYSIS DIPSTICK
Blood, UA: NEGATIVE
Glucose, UA: NEGATIVE
Ketones, UA: NEGATIVE
Leukocytes, UA: NEGATIVE
Nitrite, UA: NEGATIVE
Protein, UA: NEGATIVE

## 2017-04-28 NOTE — Progress Notes (Signed)
Fetal Surveillance Testing today:  FHR 140   High Risk Pregnancy Diagnosis(es):   Class A1 DM  G1P0 5140w4d Estimated Date of Delivery: 07/03/17  Blood pressure 102/72, pulse 87, weight 125 lb (56.7 kg), last menstrual period 09/26/2016.  Urinalysis: Negative   HPI: The patient is being seen today for ongoing management of A1 DM. Today she reports no problems   BP weight and urine results all reviewed and noted. Patient reports good fetal movement, denies any bleeding and no rupture of membranes symptoms or regular contractions.  Fundal Height:  28 Fetal Heart rate:  140 Edema:  none  Patient is without complaints other than noted in her HPI. All questions were answered.  All lab and sonogram results have been reviewed. Comments:    Assessment:  1.  Pregnancy at 4240w4d,  Estimated Date of Delivery: 07/03/17 :                          2.  Class A1 DM                        3.  Preterm contractions  Medication(s) Plans:  Continue prn procardia  Treatment Plan:  See dietitian on Tuesday  Return in about 2 weeks (around 05/12/2017) for HROB. for appointment for high risk OB care  No orders of the defined types were placed in this encounter.  Orders Placed This Encounter  Procedures  . POCT Urinalysis Dipstick

## 2017-05-02 ENCOUNTER — Encounter: Payer: Medicaid Other | Attending: Obstetrics and Gynecology | Admitting: Nutrition

## 2017-05-02 VITALS — Ht 65.0 in | Wt 125.0 lb

## 2017-05-02 DIAGNOSIS — O24419 Gestational diabetes mellitus in pregnancy, unspecified control: Secondary | ICD-10-CM | POA: Diagnosis present

## 2017-05-02 DIAGNOSIS — O24410 Gestational diabetes mellitus in pregnancy, diet controlled: Secondary | ICD-10-CM

## 2017-05-02 NOTE — Patient Instructions (Signed)
Goals Follow Gestational DM Meal Plan as discussed Eat 2-3 carb choices per meals and 1-2 carb choices with snacks Drink water Eat protein with all meals and snacks Walk 15-30 minutes a day as tolerated BS Goal before breakfast goal: 60-90 BS 2 hours after meals less than 120 mg/dl

## 2017-05-03 ENCOUNTER — Telehealth: Payer: Self-pay | Admitting: Obstetrics and Gynecology

## 2017-05-03 NOTE — Telephone Encounter (Signed)
Spoke with pt. Pt needs strips and needles for Accucheck Guide. She checks sugar 4 times a day. I called in prn refills for strips and needles. Advised pt to call dietician for further instructions on using meter. Pt voiced understanding. JSY

## 2017-05-03 NOTE — Telephone Encounter (Signed)
Patient called stating that she was just diagnosed with Diabetes and she has not idea how to use the Acute check machine. Pt also states that she ran out of the strips and the needles for the machine trying to figure it out. Pt states that she uses [facility] in [place]. Please contact pt

## 2017-05-03 NOTE — Progress Notes (Signed)
Diabetes Self-Management Education  Visit Type: First/Initial  Appt. Start Time: 1100 Appt. End Time: 1230  05/03/2017  Ms. Katelyn DaltonStephanie Martinez, identified by name and date of birth, is a 21 y.o. female with a diagnosis of Diabetes: Gestational Diabetes. Here with her mom who was  Nurse. She is overwhelmed with having gestational diabetes. Her mom, who is WNL for wt had GDM for both her pregnancies and wasn't overweight.     She admits that she use to eat a lot of sweets, drink soda and eat junk food. She notes she is trying to eat better now and started walking some. Marland Kitchen.Results for Katelyn BuddyBAKER, Katelyn K (MRN [id number]) as of 05/03/2017 13:24  Ref. Range 11/17/2016 12:17 12/15/2016 15:37 04/12/2017 22:00 04/13/2017 09:40 04/21/2017 09:03  Glucose, 1 hour Latest Ref Range: 65 - 179 mg/dL    782285 (H) 956203 (H)  Glucose, Fasting Latest Ref Range: 65 - 91 mg/dL    213113 (H) 69  Glucose, 2 hour Latest Ref Range: 65 - 152 mg/dL    086280 (H) 578133    ASSESSMENT  Height 5\' 5"  (1.651 m), weight 125 lb (56.7 kg), last menstrual period 09/26/2016. Body mass index is 20.8 kg/m.      Diabetes Self-Management Education - 05/02/17 1152      Visit Information   Visit Type First/Initial     Initial Visit   Diabetes Type Gestational Diabetes   Are you currently following a meal plan? No   Are you taking your medications as prescribed? Not on Medications   Date Diagnosed August 2018     Health Coping   How would you rate your overall health? Excellent     Psychosocial Assessment   Patient Belief/Attitude about Diabetes Motivated to manage diabetes   Self-care barriers None   Self-management support Family   Other persons present Patient;Parent   Patient Concerns Nutrition/Meal planning;Monitoring   Special Needs None   Preferred Learning Style No preference indicated   Learning Readiness Change in progress   How often do you need to have someone help you when you read instructions, pamphlets, or other written  materials from your doctor or pharmacy? 1 - Never   What is the last grade level you completed in school? 11     Pre-Education Assessment   Patient understands the diabetes disease and treatment process. Needs Instruction   Patient understands incorporating nutritional management into lifestyle. Needs Instruction   Patient undertands incorporating physical activity into lifestyle. Needs Instruction   Patient understands monitoring blood glucose, interpreting and using results Needs Instruction   Patient understands prevention, detection, and treatment of acute complications. Needs Instruction   Patient understands prevention, detection, and treatment of chronic complications. Needs Instruction   Patient understands how to develop strategies to address psychosocial issues. Needs Instruction   Patient understands how to develop strategies to promote health/change behavior. Needs Instruction     Complications   How often do you check your blood sugar? 0 times/day (not testing)   Have you had a dilated eye exam in the past 12 months? No   Have you had a dental exam in the past 12 months? No   Are you checking your feet? No     Exercise   Exercise Type Light (walking / raking leaves)   How many days per week to you exercise? 4   How many minutes per day do you exercise? 30   Total minutes per week of exercise 120     Patient Education  Previous Diabetes Education No   Chronic complications Relationship between chronic complications and blood glucose control   Preconception care Reviewed with patient blood glucose goals with pregnancy     Individualized Goals (developed by patient)   Nutrition Follow meal plan discussed;General guidelines for healthy choices and portions discussed;Adjust meds/carbs with exercise as discussed   Physical Activity Exercise 3-5 times per week;30 minutes per day   Medications Not Applicable   Monitoring  test my blood glucose as discussed;send in my blood  glucose log as discussed;test blood glucose pre and post meals as discussed   Reducing Risk examine blood glucose patterns     Post-Education Assessment   Patient understands the diabetes disease and treatment process. Needs Review   Patient understands incorporating nutritional management into lifestyle. Needs Review   Patient undertands incorporating physical activity into lifestyle. Needs Review   Patient understands monitoring blood glucose, interpreting and using results Needs Review   Patient understands how to develop strategies to address psychosocial issues. Needs Review   Patient understands how to develop strategies to promote health/change behavior. Needs Review     Outcomes   Expected Outcomes Demonstrated interest in learning. Expect positive outcomes   Future DMSE --  1 week   Program Status Not Completed     Blood Glucose Monitoring Instruction  Meter Provided: Yes Accucheck     Intervention:    Explained rationale of testing BG to obtain data as to how their diabetes is being managed.  Provided Target Ranges for both pre and post meals  Explained factors that effect BG including food (carbohydrate), stress, activity level and insulin availability in the body including diabetes medications  Taught patient techniques for using BG monitor and lancing device  Discussed need for Rx for strips and lancets   Explained rationale of recording BG both for patient and MD to assess patterns as needed.  Follow Up: Patient offered follow up as needed. Individualized Plan for Diabetes Self-Management Training:   Learning Objective:  Patient will have a greater understanding of diabetes self-management. Patient education plan is to attend individual and/or group sessions per assessed needs and concerns.     Plan:   Patient Instructions  Goals Follow Gestational DM Meal Plan as discussed Eat 2-3 carb choices per meals and 1-2 carb choices with snacks Drink water Eat  protein with all meals and snacks Walk 15-30 minutes a day as tolerated BS Goal before breakfast goal: 60-90 BS 2 hours after meals less than 120 mg/dl    Expected Outcomes:  Demonstrated interest in learning. Expect positive outcomes  Education material provided: Food label handouts, Meal plan card, My Plate and Carbohydrate counting sheet  If problems or questions, patient to contact team via:  Phone and Email  Future DSME appointment:  (1 week)

## 2017-05-05 ENCOUNTER — Telehealth: Payer: Self-pay | Admitting: *Deleted

## 2017-05-05 ENCOUNTER — Other Ambulatory Visit: Payer: Self-pay | Admitting: Obstetrics & Gynecology

## 2017-05-05 MED ORDER — TERCONAZOLE 0.4 % VA CREA: 1.0000 | TOPICAL_CREAM | Freq: Every day | VAGINAL | 0 refills | Status: DC

## 2017-05-05 NOTE — Telephone Encounter (Signed)
Pt called stating that she thinks she has a yeast infection. She has vaginal itching and discharge. I advised her to try OTC Monistat 7. Pt states that she cant afford that and prefers a prescription. Advised pt that I would send this request to a provider.

## 2017-05-05 NOTE — Telephone Encounter (Signed)
Pt informed that Rx for Terazol was sent to [facility] in [place].

## 2017-05-05 NOTE — Telephone Encounter (Signed)
Attempted to return pts call. No answer and voicemail not set up

## 2017-05-05 NOTE — Telephone Encounter (Signed)
terazol e prescribed 

## 2017-05-10 ENCOUNTER — Telehealth: Payer: Self-pay | Admitting: Women's Health

## 2017-05-10 NOTE — Telephone Encounter (Signed)
Pt called stating that she is experiencing a stuffy nose, cough, and sore throat. She states she has tried mucinex and wants to know what else she can try taking. I advised pt that she could try using cepacol throat lozenges for the sore throat; guaifenesin, robitussin or sudafed (alcohol free) for the cold. Advised pt to call back if s/s dont improve or worsen. Pt verbalized understanding.

## 2017-05-11 ENCOUNTER — Ambulatory Visit: Payer: Medicaid Other | Admitting: Nutrition

## 2017-05-12 ENCOUNTER — Encounter: Payer: Self-pay | Admitting: Obstetrics and Gynecology

## 2017-05-12 ENCOUNTER — Ambulatory Visit (INDEPENDENT_AMBULATORY_CARE_PROVIDER_SITE_OTHER): Payer: Medicaid Other | Admitting: Obstetrics and Gynecology

## 2017-05-12 VITALS — BP 100/54 | HR 84 | Wt 124.4 lb

## 2017-05-12 DIAGNOSIS — O24410 Gestational diabetes mellitus in pregnancy, diet controlled: Secondary | ICD-10-CM

## 2017-05-12 DIAGNOSIS — Z748 Other problems related to care provider dependency: Secondary | ICD-10-CM | POA: Insufficient documentation

## 2017-05-12 DIAGNOSIS — Z1389 Encounter for screening for other disorder: Secondary | ICD-10-CM

## 2017-05-12 DIAGNOSIS — O0993 Supervision of high risk pregnancy, unspecified, third trimester: Secondary | ICD-10-CM

## 2017-05-12 DIAGNOSIS — Z331 Pregnant state, incidental: Secondary | ICD-10-CM

## 2017-05-12 DIAGNOSIS — Z3A32 32 weeks gestation of pregnancy: Secondary | ICD-10-CM

## 2017-05-12 LAB — POCT URINALYSIS DIPSTICK
Blood, UA: NEGATIVE
Glucose, UA: NEGATIVE
Ketones, UA: NEGATIVE
Leukocytes, UA: NEGATIVE
Nitrite, UA: NEGATIVE
Protein, UA: NEGATIVE

## 2017-05-12 NOTE — Progress Notes (Addendum)
Katelyn Martinez is a 21 y.o. female High Risk Pregnancy HROB Diagnosis(es):   A1DM  G1P0 [redacted]w[redacted]d Estimated Date of Delivery: 07/03/17    HPI: The patient is being seen today for ongoing management of as above. Today she reports no problems. She alleges that she has been checking her blood sugar about 4x a day, before she eats, and 2 hours afterwards, and recording them on her phone. She verbalized understanding that blood sugars should be below 120 after eating. She says it has been between 78 and 90, with one high of 130 shortly after drinking a soda.  Patient reports good fetal movement, denies any bleeding and no rupture of membranes symptoms or regular contractions. Social history: Boyfriend is being ambivalent and is wondering if the baby is his: The patient has no doubt regarding paternity  BP weight and urine results reviewed and noted. Blood pressure (!) 100/54, pulse 84, weight 124 lb 6.4 oz (56.4 kg), last menstrual period 09/26/2016.  Fundal Height:  29 Fetal Heart rate:  136 Physical Examination: Abdomen - soft, nontender, nondistended, no masses or organomegaly                                     Pelvic - not indicated                                     Edema:  none  Urinalysis:NEGATIVE  Fetal Surveillance Testing today:  FHR - 136  Assessment:  1.  Pregnancy at [redacted]w[redacted]d,  G1P0   :  Estimated Date of Delivery: 07/03/17                        2.  A1DM                         3. Preterm contractions  Medication(s) Plans:  Continue prn procardia  Treatment Plan:  Dietician seen on 05/02/17, continue tracking blood sugars  Follow up in 2 weeks for appointment for high risk OB care, A1DM    By signing my name below, I, Izna Ahmed, attest that this documentation has been prepared under the direction and in the presence of Tilda Burrow, MD. Electronically Signed: Redge Gainer, Medical Scribe. 05/12/17. 12:58 PM.  I personally performed the services described in this  documentation, which was SCRIBED in my presence. The recorded information has been reviewed and considered accurate. It has been edited as necessary during review. Tilda Burrow, MD

## 2017-05-23 ENCOUNTER — Ambulatory Visit (INDEPENDENT_AMBULATORY_CARE_PROVIDER_SITE_OTHER): Payer: Medicaid Other | Admitting: Obstetrics and Gynecology

## 2017-05-23 ENCOUNTER — Encounter: Payer: Self-pay | Admitting: Obstetrics and Gynecology

## 2017-05-23 VITALS — BP 80/40 | HR 82 | Wt 125.8 lb

## 2017-05-23 DIAGNOSIS — Z3A34 34 weeks gestation of pregnancy: Secondary | ICD-10-CM

## 2017-05-23 DIAGNOSIS — O0990 Supervision of high risk pregnancy, unspecified, unspecified trimester: Secondary | ICD-10-CM

## 2017-05-23 DIAGNOSIS — Z331 Pregnant state, incidental: Secondary | ICD-10-CM

## 2017-05-23 DIAGNOSIS — O09893 Supervision of other high risk pregnancies, third trimester: Secondary | ICD-10-CM

## 2017-05-23 DIAGNOSIS — Z1389 Encounter for screening for other disorder: Secondary | ICD-10-CM

## 2017-05-23 LAB — POCT URINALYSIS DIPSTICK
Blood, UA: NEGATIVE
Glucose, UA: NEGATIVE
Ketones, UA: NEGATIVE
Leukocytes, UA: NEGATIVE
Nitrite, UA: NEGATIVE
Protein, UA: NEGATIVE
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
## 2017-06-07 ENCOUNTER — Ambulatory Visit (INDEPENDENT_AMBULATORY_CARE_PROVIDER_SITE_OTHER): Payer: Medicaid Other | Admitting: Advanced Practice Midwife

## 2017-06-07 ENCOUNTER — Encounter: Payer: Self-pay | Admitting: Advanced Practice Midwife

## 2017-06-07 VITALS — BP 120/60 | HR 86 | Wt 126.5 lb

## 2017-06-07 DIAGNOSIS — Z331 Pregnant state, incidental: Secondary | ICD-10-CM

## 2017-06-07 DIAGNOSIS — O0993 Supervision of high risk pregnancy, unspecified, third trimester: Secondary | ICD-10-CM

## 2017-06-07 DIAGNOSIS — O24410 Gestational diabetes mellitus in pregnancy, diet controlled: Secondary | ICD-10-CM

## 2017-06-07 DIAGNOSIS — Z3403 Encounter for supervision of normal first pregnancy, third trimester: Secondary | ICD-10-CM

## 2017-06-07 DIAGNOSIS — Z3A36 36 weeks gestation of pregnancy: Secondary | ICD-10-CM

## 2017-06-07 DIAGNOSIS — Z1389 Encounter for screening for other disorder: Secondary | ICD-10-CM

## 2017-06-07 LAB — POCT URINALYSIS DIPSTICK
Glucose, UA: NEGATIVE
Ketones, UA: NEGATIVE
Nitrite, UA: NEGATIVE
Protein, UA: NEGATIVE

## 2017-06-07 NOTE — Progress Notes (Signed)
Fetal Surveillance Testing today:  doppler   High Risk Pregnancy Diagnosis(es):   A1DM  G1P0 [redacted]w[redacted]d Estimated Date of Delivery: 07/03/17  Blood pressure 120/60, pulse 86, weight 126 lb 8 oz (57.4 kg), last menstrual period 09/26/2016.  Urinalysis: Not examined   HPI: The patient is being seen today for ongoing management of the above. Today she reports she forgot her log. Says she has kept up with them on her phone and her phone was turned off.  Discussed taht even without cellular service she could [facility].  Did not bring phone.  Fussed at pt a little and stressed the improtance of bringing records to visits.  States that BS are "all normal".   BP weight and urine results all reviewed and noted. Patient reports good fetal movement, denies any bleeding and no rupture of membranes symptoms or regular contractions.  Fundal Height:  33 Fetal Heart rate:  145 Edema:  no  Patient is without complaints other than noted in her HPI. All questions were answered.  All lab and sonogram results have been reviewed. Comments:    Assessment:  1.  Pregnancy at [redacted]w[redacted]d,  Estimated Date of Delivery: 07/03/17 :                          2.  A1DM, excellent control per pt                        3.  Hasn't brough evidence of BS to any appts  Medication(s) Plans:  none  Treatment Plan:  Continue QID blood sugar testing, EFW 36-38 weeks, IOL 40 weeks PT TO WRITE DOWN BS ON A PIECE OF PAPER AND BRING  Return in about 1 week (around 06/14/2017) for HROB, US:EFW. for appointment for high risk OB care  Meds ordered this encounter  Medications  . Acetaminophen (TYLENOL PO)    Sig: Take by mouth as needed.   Orders Placed This Encounter  Procedures  . Strep Gp B NAA  . GC/Chlamydia Probe Amp  . US OB Follow Up  . POCT Urinalysis Dipstick

## 2017-06-10 LAB — STREP GP B NAA: Strep Gp B NAA: POSITIVE — AB

## 2017-06-11 LAB — GC/CHLAMYDIA PROBE AMP
Chlamydia trachomatis, NAA: NEGATIVE
Neisseria gonorrhoeae by PCR: NEGATIVE

## 2017-06-14 ENCOUNTER — Ambulatory Visit (INDEPENDENT_AMBULATORY_CARE_PROVIDER_SITE_OTHER): Payer: Medicaid Other

## 2017-06-14 ENCOUNTER — Encounter: Payer: Medicaid Other | Admitting: Women's Health

## 2017-06-14 ENCOUNTER — Ambulatory Visit (INDEPENDENT_AMBULATORY_CARE_PROVIDER_SITE_OTHER): Payer: Medicaid Other | Admitting: Women's Health

## 2017-06-14 ENCOUNTER — Encounter: Payer: Self-pay | Admitting: Women's Health

## 2017-06-14 VITALS — BP 130/82 | HR 93 | Wt 128.0 lb

## 2017-06-14 DIAGNOSIS — O0993 Supervision of high risk pregnancy, unspecified, third trimester: Secondary | ICD-10-CM | POA: Diagnosis not present

## 2017-06-14 DIAGNOSIS — O24410 Gestational diabetes mellitus in pregnancy, diet controlled: Secondary | ICD-10-CM | POA: Diagnosis not present

## 2017-06-14 DIAGNOSIS — Z331 Pregnant state, incidental: Secondary | ICD-10-CM

## 2017-06-14 DIAGNOSIS — Z3A37 37 weeks gestation of pregnancy: Secondary | ICD-10-CM

## 2017-06-14 DIAGNOSIS — Z1389 Encounter for screening for other disorder: Secondary | ICD-10-CM

## 2017-06-14 DIAGNOSIS — O0990 Supervision of high risk pregnancy, unspecified, unspecified trimester: Secondary | ICD-10-CM

## 2017-06-14 LAB — POCT URINALYSIS DIPSTICK
Blood, UA: NEGATIVE
Glucose, UA: NEGATIVE
Ketones, UA: NEGATIVE
Nitrite, UA: NEGATIVE
Protein, UA: NEGATIVE

## 2017-06-14 MED ORDER — BUTALBITAL-APAP-CAFFEINE 50-325-40 MG PO TABS: 1.0000 | ORAL_TABLET | Freq: Four times a day (QID) | ORAL | 0 refills | Status: DC | PRN

## 2017-06-14 NOTE — Progress Notes (Signed)
   [facility] High-Risk Pregnancy Visit  Patient name: Katelyn Martinez MRN [id number]  Date of birth: [birth year] CC & HPI:  Katelyn Martinez is a 21 y.o. G1P0 female at [redacted]w[redacted]d with an Estimated Date of Delivery: 07/03/17 being seen today for ongoing management of a high-risk pregnancy complicated by A1DM.   Today she reports: brought log today- all sugars normal.  no complaints. Requests refill on Fioricet for headaches, is completely out, has had headaches throughout pregnancy, no recent change   Review of Systems:   Reports good fetal movement. Denies regular contractions, leakage of fluid, vaginal bleeding, abnormal vaginal discharge w/ itching/odor/irritation, headaches, visual changes, shortness of breath, chest pain, abdominal pain, severe nausea/vomiting, or problems with urination or bowel movements.   Pertinent History Reviewed:  Reviewed past medical,surgical and family history.  Reviewed problem list, medications and allergies.  Objective Findings:   Vitals:   06/14/17 1042  BP: 130/82  Pulse: 93  Weight: 128 lb (58.1 kg)    Body mass index is 21.3 kg/m.  Fundal Height:  32 Fetal Heart rate:  135 Edema:  none Fetal Surveillance Testing today:  U/S today for EFW: vtx, afi 10cm, efw 3050g/46%  Results for orders placed or performed in visit on 06/14/17 (from the past 24 hour(s))  POCT Urinalysis Dipstick   Collection Time: 06/14/17 10:44 AM  Result Value Ref Range   Color, UA     Clarity, UA     Glucose, UA neg    Bilirubin, UA     Ketones, UA neg    Spec Grav, UA  1.010 - 1.025   Blood, UA neg    pH, UA  5.0 - 8.0   Protein, UA neg    Urobilinogen, UA  0.2 or 1.0 E.U./dL   Nitrite, UA neg    Leukocytes, UA Moderate (2+) (A) Negative     Assessment & Plan:   1) High-risk pregnancy G1P0 at [redacted]w[redacted]d with an Estimated Date of Delivery: 07/03/17  2) A1DM, stable  Refilled fioricet per request  Treatment Plan:  Deliver @ 40wks, schedule at visit next  week  Reviewed: labor s/s, fkc All questions were answered  Return in about 1 week (around 06/21/2017) for HROB.   Orders Placed This Encounter  Procedures  . POCT Urinalysis Dipstick     Marge Duncans CNM, [facility] 06/14/2017 11:19 AM

## 2017-06-14 NOTE — Progress Notes (Signed)
[place] 37+2 wks,cephalic,fhr 135 bpm,post pl gr 2,bilat adnexa's wnl,afi 10 cm,efw 3050 g 46%

## 2017-06-14 NOTE — Patient Instructions (Addendum)
Call the office ([phone]) or go to [facility] if:  You begin to have strong, frequent contractions  Your water breaks.  Sometimes it is a big gush of fluid, sometimes it is just a trickle that keeps getting your panties wet or running down your legs  You have vaginal bleeding.  It is normal to have a small amount of spotting if your cervix was checked.   You don't feel your baby moving like normal.  If you don't, get you something to eat and drink and lay down and focus on feeling your baby move.  You should feel at least 10 movements in 2 hours.  If you don't, you should call the office or go to [facility].     Braxton Hicks Contractions Contractions of the uterus can occur throughout pregnancy, but they are not always a sign that you are in labor. You may have practice contractions called Braxton Hicks contractions. These false labor contractions are sometimes confused with true labor. What are Braxton Hicks contractions? Braxton Hicks contractions are tightening movements that occur in the muscles of the uterus before labor. Unlike true labor contractions, these contractions do not result in opening (dilation) and thinning of the cervix. Toward the end of pregnancy (32-34 weeks), Braxton Hicks contractions can happen more often and may become stronger. These contractions are sometimes difficult to tell apart from true labor because they can be very uncomfortable. You should not feel embarrassed if you go to the hospital with false labor. Sometimes, the only way to tell if you are in true labor is for your health care provider to look for changes in the cervix. The health care provider will do a physical exam and may monitor your contractions. If you are not in true labor, the exam should show that your cervix is not dilating and your water has not broken. If there are no prenatal problems or other health problems associated with your pregnancy, it is completely safe for you to be sent  home with false labor. You may continue to have Braxton Hicks contractions until you go into true labor. How can I tell the difference between true labor and false labor?  Differences ? False labor ? Contractions last 30-70 seconds.: Contractions are usually shorter and not as strong as true labor contractions. ? Contractions become very regular.: Contractions are usually irregular. ? Discomfort is usually felt in the top of the uterus, and it spreads to the lower abdomen and low back.: Contractions are often felt in the front of the lower abdomen and in the groin. ? Contractions do not go away with walking.: Contractions may go away when you walk around or change positions while lying down. ? Contractions usually become more intense and increase in frequency.: Contractions get weaker and are shorter-lasting as time goes on. ? The cervix dilates and gets thinner.: The cervix usually does not dilate or become thin. Follow these instructions at home:  Take over-the-counter and prescription medicines only as told by your health care provider.  Keep up with your usual exercises and follow other instructions from your health care provider.  Eat and drink lightly if you think you are going into labor.  If Braxton Hicks contractions are making you uncomfortable: ? Change your position from lying down or resting to walking, or change from walking to resting. ? Sit and rest in a tub of warm water. ? Drink enough fluid to keep your urine clear or pale yellow. Dehydration may cause these contractions. ?   Do slow and deep breathing several times an hour.  Keep all follow-up prenatal visits as told by your health care provider. This is important. Contact a health care provider if:  You have a fever.  You have continuous pain in your abdomen. Get help right away if:  Your contractions become stronger, more regular, and closer together.  You have fluid leaking or gushing from your vagina.  You  pass blood-tinged mucus (bloody show).  You have bleeding from your vagina.  You have low back pain that you never had before.  You feel your baby's head pushing down and causing pelvic pressure.  Your baby is not moving inside you as much as it used to. Summary  Contractions that occur before labor are called Braxton Hicks contractions, false labor, or practice contractions.  Braxton Hicks contractions are usually shorter, weaker, farther apart, and less regular than true labor contractions. True labor contractions usually become progressively stronger and regular and they become more frequent.  Manage discomfort from Braxton Hicks contractions by changing position, resting in a warm bath, drinking plenty of water, or practicing deep breathing. This information is not intended to replace advice given to you by your health care provider. Make sure you discuss any questions you have with your health care provider. Document Released: 09/05/2005 Document Revised: 07/25/2016 Document Reviewed: 07/25/2016 Elsevier Interactive Patient Education  2017 [employer]  

## 2017-06-15 ENCOUNTER — Inpatient Hospital Stay (HOSPITAL_COMMUNITY)
Admission: AD | Admit: 2017-06-15 | Discharge: 2017-06-15 | Disposition: A | Discharge status: Home / Self Care | Payer: Medicaid Other | Source: Ambulatory Visit | Attending: Obstetrics & Gynecology | Admitting: Obstetrics & Gynecology

## 2017-06-15 DIAGNOSIS — Z3A37 37 weeks gestation of pregnancy: Secondary | ICD-10-CM | POA: Diagnosis not present

## 2017-06-15 DIAGNOSIS — O0990 Supervision of high risk pregnancy, unspecified, unspecified trimester: Secondary | ICD-10-CM

## 2017-06-15 DIAGNOSIS — O479 False labor, unspecified: Secondary | ICD-10-CM

## 2017-06-15 DIAGNOSIS — O26893 Other specified pregnancy related conditions, third trimester: Secondary | ICD-10-CM | POA: Insufficient documentation

## 2017-06-15 NOTE — Discharge Instructions (Signed)
Fetal Movement Counts °Patient Name: ________________________________________________ Patient Due Date: ____________________ °What is a fetal movement count? °A fetal movement count is the number of times that you feel your baby move during a certain amount of time. This may also be called a fetal kick count. A fetal movement count is recommended for every pregnant woman. You may be asked to start counting fetal movements as early as week 28 of your pregnancy. °Pay attention to when your baby is most active. You may notice your baby's sleep and wake cycles. You may also notice things that make your baby move more. You should do a fetal movement count: °· When your baby is normally most active. °· At the same time each day. ° °A good time to count movements is while you are resting, after having something to eat and drink. °How do I count fetal movements? °1. Find a quiet, comfortable area. Sit, or lie down on your side. °2. Write down the date, the start time and stop time, and the number of movements that you felt between those two times. Take this information with you to your health care visits. °3. For 2 hours, count kicks, flutters, swishes, rolls, and jabs. You should feel at least 10 movements during 2 hours. °4. You may stop counting after you have felt 10 movements. °5. If you do not feel 10 movements in 2 hours, have something to eat and drink. Then, keep resting and counting for 1 hour. If you feel at least 4 movements during that hour, you may stop counting. °Contact a health care provider if: °· You feel fewer than 4 movements in 2 hours. °· Your baby is not moving like he or she usually does. °Date: ____________ Start time: ____________ Stop time: ____________ Movements: ____________ °Date: ____________ Start time: ____________ Stop time: ____________ Movements: ____________ °Date: ____________ Start time: ____________ Stop time: ____________ Movements: ____________ °Date: ____________ Start time:  ____________ Stop time: ____________ Movements: ____________ °Date: ____________ Start time: ____________ Stop time: ____________ Movements: ____________ °Date: ____________ Start time: ____________ Stop time: ____________ Movements: ____________ °Date: ____________ Start time: ____________ Stop time: ____________ Movements: ____________ °Date: ____________ Start time: ____________ Stop time: ____________ Movements: ____________ °Date: ____________ Start time: ____________ Stop time: ____________ Movements: ____________ °This information is not intended to replace advice given to you by your health care provider. Make sure you discuss any questions you have with your health care provider. °Document Released: 10/05/2006 Document Revised: 05/04/2016 Document Reviewed: 10/15/2015 °Elsevier Interactive Patient Education © 2018 [employer] °Braxton Hicks Contractions °Contractions of the uterus can occur throughout pregnancy, but they are not always a sign that you are in labor. You may have practice contractions called Braxton Hicks contractions. These false labor contractions are sometimes confused with true labor. °What are Braxton Hicks contractions? °Braxton Hicks contractions are tightening movements that occur in the muscles of the uterus before labor. Unlike true labor contractions, these contractions do not result in opening (dilation) and thinning of the cervix. Toward the end of pregnancy (32-34 weeks), Braxton Hicks contractions can happen more often and may become stronger. These contractions are sometimes difficult to tell apart from true labor because they can be very uncomfortable. You should not feel embarrassed if you go to the hospital with false labor. °Sometimes, the only way to tell if you are in true labor is for your health care provider to look for changes in the cervix. The health care provider will do a physical exam and may monitor your contractions. If   you are not in true labor, the exam  should show that your cervix is not dilating and your water has not broken. °If there are no prenatal problems or other health problems associated with your pregnancy, it is completely safe for you to be sent home with false labor. You may continue to have Braxton Hicks contractions until you go into true labor. °How can I tell the difference between true labor and false labor? °· Differences °? False labor °? Contractions last 30-70 seconds.: Contractions are usually shorter and not as strong as true labor contractions. °? Contractions become very regular.: Contractions are usually irregular. °? Discomfort is usually felt in the top of the uterus, and it spreads to the lower abdomen and low back.: Contractions are often felt in the front of the lower abdomen and in the groin. °? Contractions do not go away with walking.: Contractions may go away when you walk around or change positions while lying down. °? Contractions usually become more intense and increase in frequency.: Contractions get weaker and are shorter-lasting as time goes on. °? The cervix dilates and gets thinner.: The cervix usually does not dilate or become thin. °Follow these instructions at home: °· Take over-the-counter and prescription medicines only as told by your health care provider. °· Keep up with your usual exercises and follow other instructions from your health care provider. °· Eat and drink lightly if you think you are going into labor. °· If Braxton Hicks contractions are making you uncomfortable: °? Change your position from lying down or resting to walking, or change from walking to resting. °? Sit and rest in a tub of warm water. °? Drink enough fluid to keep your urine clear or pale yellow. Dehydration may cause these contractions. °? Do slow and deep breathing several times an hour. °· Keep all follow-up prenatal visits as told by your health care provider. This is important. °Contact a health care provider if: °· You have a  fever. °· You have continuous pain in your abdomen. °Get help right away if: °· Your contractions become stronger, more regular, and closer together. °· You have fluid leaking or gushing from your vagina. °· You pass blood-tinged mucus (bloody show). °· You have bleeding from your vagina. °· You have low back pain that you never had before. °· You feel your baby’s head pushing down and causing pelvic pressure. °· Your baby is not moving inside you as much as it used to. °Summary °· Contractions that occur before labor are called Braxton Hicks contractions, false labor, or practice contractions. °· Braxton Hicks contractions are usually shorter, weaker, farther apart, and less regular than true labor contractions. True labor contractions usually become progressively stronger and regular and they become more frequent. °· Manage discomfort from Braxton Hicks contractions by changing position, resting in a warm bath, drinking plenty of water, or practicing deep breathing. °This information is not intended to replace advice given to you by your health care provider. Make sure you discuss any questions you have with your health care provider. °Document Released: 09/05/2005 Document Revised: 07/25/2016 Document Reviewed: 07/25/2016 °Elsevier Interactive Patient Education © 2017 Elsevier [employer]. ° °

## 2017-06-15 NOTE — MAU Note (Signed)
Pt reports contractions q 3-5 minutes since yesterday.

## 2017-06-15 NOTE — MAU Note (Signed)
Urine sent to lab 

## 2017-06-16 ENCOUNTER — Telehealth: Payer: Self-pay | Admitting: *Deleted

## 2017-06-16 NOTE — Telephone Encounter (Signed)
Entered in error

## 2017-06-18 ENCOUNTER — Inpatient Hospital Stay (HOSPITAL_COMMUNITY): Payer: Medicaid Other | Admitting: Anesthesiology

## 2017-06-18 ENCOUNTER — Inpatient Hospital Stay (HOSPITAL_COMMUNITY)
Admission: AD | Admit: 2017-06-18 | Discharge: 2017-06-21 | DRG: 807 | Disposition: A | Discharge status: Home / Self Care | Payer: Medicaid Other | Source: Ambulatory Visit | Attending: Family Medicine | Admitting: Family Medicine

## 2017-06-18 ENCOUNTER — Encounter (HOSPITAL_COMMUNITY): Payer: Self-pay | Admitting: Advanced Practice Midwife

## 2017-06-18 DIAGNOSIS — Z3A37 37 weeks gestation of pregnancy: Secondary | ICD-10-CM | POA: Diagnosis not present

## 2017-06-18 DIAGNOSIS — Z23 Encounter for immunization: Secondary | ICD-10-CM

## 2017-06-18 DIAGNOSIS — O4292 Full-term premature rupture of membranes, unspecified as to length of time between rupture and onset of labor: Principal | ICD-10-CM | POA: Diagnosis present

## 2017-06-18 DIAGNOSIS — O24420 Gestational diabetes mellitus in childbirth, diet controlled: Secondary | ICD-10-CM | POA: Diagnosis present

## 2017-06-18 DIAGNOSIS — Z3A38 38 weeks gestation of pregnancy: Secondary | ICD-10-CM | POA: Diagnosis not present

## 2017-06-18 DIAGNOSIS — O99824 Streptococcus B carrier state complicating childbirth: Secondary | ICD-10-CM | POA: Diagnosis present

## 2017-06-18 DIAGNOSIS — O4202 Full-term premature rupture of membranes, onset of labor within 24 hours of rupture: Secondary | ICD-10-CM | POA: Diagnosis not present

## 2017-06-18 DIAGNOSIS — O0990 Supervision of high risk pregnancy, unspecified, unspecified trimester: Secondary | ICD-10-CM

## 2017-06-18 DIAGNOSIS — O4290 Premature rupture of membranes, unspecified as to length of time between rupture and onset of labor, unspecified weeks of gestation: Secondary | ICD-10-CM | POA: Diagnosis present

## 2017-06-18 HISTORY — DX: Acute upper respiratory infection, unspecified: J06.9

## 2017-06-18 LAB — TYPE AND SCREEN
ABO/RH(D): O POS
Antibody Screen: NEGATIVE

## 2017-06-18 LAB — CBC
HCT: 34.7 % — ABNORMAL LOW (ref 36.0–46.0)
Hemoglobin: 11.7 g/dL — ABNORMAL LOW (ref 12.0–15.0)
MCH: 28.1 pg (ref 26.0–34.0)
MCHC: 33.7 g/dL (ref 30.0–36.0)
MCV: 83.4 fL (ref 78.0–100.0)
Platelets: 216 10*3/uL (ref 150–400)
RBC: 4.16 MIL/uL (ref 3.87–5.11)
RDW: 13.3 % (ref 11.5–15.5)
WBC: 12.2 10*3/uL — ABNORMAL HIGH (ref 4.0–10.5)

## 2017-06-18 LAB — GLUCOSE, CAPILLARY
Glucose-Capillary: 86 mg/dL (ref 65–99)
Glucose-Capillary: 65 mg/dL (ref 65–99)
Glucose-Capillary: 62 mg/dL — ABNORMAL LOW (ref 65–99)
Glucose-Capillary: 78 mg/dL (ref 65–99)
Glucose-Capillary: 71 mg/dL (ref 65–99)
Glucose-Capillary: 69 mg/dL (ref 65–99)

## 2017-06-18 LAB — HIV ANTIBODY (ROUTINE TESTING W REFLEX): HIV Screen 4th Generation wRfx: NONREACTIVE

## 2017-06-18 LAB — RPR: RPR Ser Ql: NONREACTIVE

## 2017-06-18 LAB — ABO/RH: ABO/RH(D): O POS

## 2017-06-18 LAB — POCT FERN TEST: POCT Fern Test: POSITIVE

## 2017-06-18 MED ORDER — MISOPROSTOL 200 MCG PO TABS
50.0000 ug | ORAL_TABLET | ORAL | Status: DC | PRN
  Administered 2017-06-18 (×3): 50 ug via ORAL
  Filled 2017-06-18 (×3): qty 1

## 2017-06-18 MED ORDER — PENICILLIN G POTASSIUM 5000000 UNITS IJ SOLR
5.0000 10*6.[IU] | Freq: Once | INTRAVENOUS | Status: AC
  Administered 2017-06-18: 5 10*6.[IU] via INTRAVENOUS
  Filled 2017-06-18: qty 5

## 2017-06-18 MED ORDER — PENICILLIN G POT IN DEXTROSE 60000 UNIT/ML IV SOLN
3.0000 10*6.[IU] | INTRAVENOUS | Status: DC
  Administered 2017-06-18 (×5): 3 10*6.[IU] via INTRAVENOUS
  Filled 2017-06-18 (×8): qty 50

## 2017-06-18 MED ORDER — ONDANSETRON HCL 4 MG/2ML IJ SOLN: 4.0000 mg | Freq: Four times a day (QID) | INTRAMUSCULAR | Status: DC | PRN

## 2017-06-18 MED ORDER — OXYTOCIN BOLUS FROM INFUSION
500.0000 mL | Freq: Once | INTRAVENOUS | Status: AC
  Administered 2017-06-19: 500 mL via INTRAVENOUS

## 2017-06-18 MED ORDER — TERBUTALINE SULFATE 1 MG/ML IJ SOLN
0.2500 mg | Freq: Once | INTRAMUSCULAR | Status: DC | PRN
  Filled 2017-06-18: qty 1

## 2017-06-18 MED ORDER — LACTATED RINGERS IV SOLN
INTRAVENOUS | Status: DC
  Administered 2017-06-19: 01:00:00 via INTRAUTERINE

## 2017-06-18 MED ORDER — DIPHENHYDRAMINE HCL 50 MG/ML IJ SOLN: 12.5000 mg | INTRAMUSCULAR | Status: DC | PRN

## 2017-06-18 MED ORDER — FENTANYL CITRATE (PF) 100 MCG/2ML IJ SOLN
100.0000 ug | INTRAMUSCULAR | Status: DC | PRN
  Administered 2017-06-18 (×2): 100 ug via INTRAVENOUS
  Filled 2017-06-18 (×2): qty 2

## 2017-06-18 MED ORDER — SOD CITRATE-CITRIC ACID 500-334 MG/5ML PO SOLN: 30.0000 mL | ORAL | Status: DC | PRN

## 2017-06-18 MED ORDER — OXYTOCIN 40 UNITS IN LACTATED RINGERS INFUSION - SIMPLE MED
1.0000 m[IU]/min | INTRAVENOUS | Status: DC
  Administered 2017-06-18: 2 m[IU]/min via INTRAVENOUS
  Filled 2017-06-18: qty 1000

## 2017-06-18 MED ORDER — EPHEDRINE 5 MG/ML INJ: 10.0000 mg | INTRAVENOUS | Status: DC | PRN

## 2017-06-18 MED ORDER — PHENYLEPHRINE 40 MCG/ML (10ML) SYRINGE FOR IV PUSH (FOR BLOOD PRESSURE SUPPORT): 80.0000 ug | PREFILLED_SYRINGE | INTRAVENOUS | Status: DC | PRN

## 2017-06-18 MED ORDER — EPHEDRINE 5 MG/ML INJ
10.0000 mg | INTRAVENOUS | Status: DC | PRN
  Filled 2017-06-18: qty 2

## 2017-06-18 MED ORDER — PHENYLEPHRINE 40 MCG/ML (10ML) SYRINGE FOR IV PUSH (FOR BLOOD PRESSURE SUPPORT)
80.0000 ug | PREFILLED_SYRINGE | INTRAVENOUS | Status: DC | PRN
  Filled 2017-06-18: qty 5

## 2017-06-18 MED ORDER — OXYCODONE-ACETAMINOPHEN 5-325 MG PO TABS: 1.0000 | ORAL_TABLET | ORAL | Status: DC | PRN

## 2017-06-18 MED ORDER — LIDOCAINE HCL (PF) 1 % IJ SOLN
INTRAMUSCULAR | Status: DC | PRN
  Administered 2017-06-18 (×2): 6 mL via EPIDURAL

## 2017-06-18 MED ORDER — FENTANYL 2.5 MCG/ML BUPIVACAINE 1/10 % EPIDURAL INFUSION (WH - ANES)
14.0000 mL/h | INTRAMUSCULAR | Status: DC | PRN
  Administered 2017-06-18 (×2): 14 mL/h via EPIDURAL
  Filled 2017-06-18 (×2): qty 100

## 2017-06-18 MED ORDER — LACTATED RINGERS IV SOLN: 500.0000 mL | INTRAVENOUS | Status: DC | PRN

## 2017-06-18 MED ORDER — OXYTOCIN 40 UNITS IN LACTATED RINGERS INFUSION - SIMPLE MED
2.5000 [IU]/h | INTRAVENOUS | Status: DC
  Administered 2017-06-19: 2.5 [IU]/h via INTRAVENOUS

## 2017-06-18 MED ORDER — OXYCODONE-ACETAMINOPHEN 5-325 MG PO TABS
2.0000 | ORAL_TABLET | ORAL | Status: DC | PRN
Start: 2017-06-18 — End: 2017-06-19

## 2017-06-18 MED ORDER — LACTATED RINGERS IV SOLN: 500.0000 mL | Freq: Once | INTRAVENOUS | Status: DC

## 2017-06-18 MED ORDER — LACTATED RINGERS IV SOLN
INTRAVENOUS | Status: DC
  Administered 2017-06-18 (×2): via INTRAVENOUS

## 2017-06-18 MED ORDER — LIDOCAINE HCL (PF) 1 % IJ SOLN
30.0000 mL | INTRAMUSCULAR | Status: DC | PRN
  Filled 2017-06-18: qty 30

## 2017-06-18 MED ORDER — LACTATED RINGERS IV SOLN
500.0000 mL | Freq: Once | INTRAVENOUS | Status: AC
Start: 2017-06-18 — End: 2017-06-18
  Administered 2017-06-18: 500 mL via INTRAVENOUS

## 2017-06-18 MED ORDER — ACETAMINOPHEN 325 MG PO TABS: 650.0000 mg | ORAL_TABLET | ORAL | Status: DC | PRN

## 2017-06-18 NOTE — Progress Notes (Signed)
Katelyn Martinez is a 21 y.o. G1P0 at [redacted]w[redacted]d by ultrasound admitted for induction of labor due to SROM.  Subjective:   Objective: BP 128/83   Pulse 69   Temp 98.1 F (36.7 C) (Oral)   Resp 16   Ht  (1.651 m)   Wt 129 lb (58.5 kg)   LMP 09/26/2016 (Exact Date)   SpO2 99%   BMI 21.47 kg/m  No intake/output data recorded. No intake/output data recorded.  FHT:  FHR: 120's bpm, variability: moderate,  accelerations:  Present,  decelerations:  Absent UC:   irregular, every 2-6 minutes mild in intensity SVE:   Dilation: 1 Effacement (%): 80 Station: -2 Exam by:: J.Follmer,RNC  Labs: Lab Results  Component Value Date   WBC 12.2 (H) 06/18/2017   HGB 11.7 (L) 06/18/2017   HCT 34.7 (L) 06/18/2017   MCV 83.4 06/18/2017   PLT 216 06/18/2017    Assessment / Plan: Yet to be in labor  Labor: yet to be in labor Preeclampsia:  no signs or symptoms of toxicity Fetal Wellbeing:  Category I Pain Control:  epidural I/D:  n/a Anticipated MOD:  NSVD  Wyvonnia Dusky 06/18/2017, 6:54 PM
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
## 2017-06-18 NOTE — Progress Notes (Addendum)
Katelyn Martinez is a 21 y.o. G1P0 at [redacted]w[redacted]d by ultrasound admitted for induction of labor due to PROM.  Subjective: Patient comfortable with epidural. Hoping to get some rest.  Objective: BP 131/71   Pulse (!) 55   Temp 98.6 F (37 C) (Oral)   Resp 16   Ht  (1.651 m)   Wt 58.5 kg (129 lb)   LMP 09/26/2016 (Exact Date)   SpO2 99%   BMI 21.47 kg/m  No intake/output data recorded. No intake/output data recorded.  FHT:  FHR: 125 bpm, variability: moderate,  accelerations:  Present,  decelerations:  Present variable UC:   irregular, every 1-5 minutes SVE:   Dilation: 4 Effacement (%): 90 Station: 0 Exam by:: J.Follmer,RNC  Labs: Lab Results  Component Value Date   WBC 12.2 (H) 06/18/2017   HGB 11.7 (L) 06/18/2017   HCT 34.7 (L) 06/18/2017   MCV 83.4 06/18/2017   PLT 216 06/18/2017    Assessment / Plan: Yet to be in labor  Labor: yet to be in labor, progressing with pitocin GDMAI: CBGs wnl Fetal Wellbeing:  Category II Pain Control:  epidural I/D:  n/a Anticipated MOD:  NSVD  [name], DO 06/18/2017, 9:15 PM

## 2017-06-18 NOTE — Progress Notes (Signed)
Katelyn Martinez is a 21 y.o. G1P0 at [redacted]w[redacted]d by ultrasound admitted for induction of labor due to PROM.  Subjective: Patient comfortable with epidural. Resting.  Objective: BP 123/68   Pulse 67   Temp 98.2 F (36.8 C) (Oral)   Resp 16   Ht  (1.651 m)   Wt 58.5 kg (129 lb)   LMP 09/26/2016 (Exact Date)   SpO2 99%   BMI 21.47 kg/m  No intake/output data recorded. Total I/O In: -  Out: 1400 [Urine:1400]  FHT:  FHR: 125 bpm, variability: moderate,  accelerations:  Present,  decelerations:  Present variable UC:   irregular, every 1-5 minutes SVE:   Dilation: 6 Effacement (%): 90 Station: -1 Exam by:: J.Follmer,RNC  Labs: Lab Results  Component Value Date   WBC 12.2 (H) 06/18/2017   HGB 11.7 (L) 06/18/2017   HCT 34.7 (L) 06/18/2017   MCV 83.4 06/18/2017   PLT 216 06/18/2017    Assessment / Plan: PROM  Labor: Progressing on Pitocin, will rupture forebag and then place IUPC for amnioinfusion 2/2 variables GDMAI: CBGs wnl Fetal Wellbeing:  Category II Pain Control:  epidural I/D:  n/a Anticipated MOD:  NSVD  Katelyn Ada, DO 06/18/2017, 11:36 PM

## 2017-06-18 NOTE — Anesthesia Procedure Notes (Signed)
Epidural Patient location during procedure: OB Start time: 06/18/2017 4:25 PM End time: 06/18/2017 4:48 PM  Staffing Anesthesiologist: Jairo Ben Performed: anesthesiologist   Preanesthetic Checklist Completed: patient identified, surgical consent, pre-op evaluation, timeout performed, IV checked, risks and benefits discussed and monitors and equipment checked  Epidural Patient position: sitting Prep: site prepped and draped and DuraPrep Patient monitoring: blood pressure, continuous pulse ox and heart rate Approach: midline Location: L3-L4 Injection technique: LOR air  Needle:  Needle type: Tuohy  Needle gauge: 17 G Needle length: 9 cm Needle insertion depth: 3.5 cm Catheter type: closed end flexible Catheter size: 19 Gauge Catheter at skin depth: 9.5 cm Test dose: negative (1% lidocaine)  Assessment Events: blood not aspirated, injection not painful, no injection resistance, negative IV test and no paresthesia  Additional Notes Pt identified in Labor room.  Monitors applied. Working IV access confirmed. Sterile prep, drape lumbar spine.  1% lido local L 3,4.  #17ga Touhy LOR air at 3.5 cm L 3,4, cath in easily to 9.5 cm skin. Test dose OK, cath dosed and infusion begun.  Patient asymptomatic, VSS, no heme aspirated, tolerated well.  Sandford Craze, MD

## 2017-06-18 NOTE — Progress Notes (Signed)
Katelyn Martinez is a 21 y.o. G1P0 at [redacted]w[redacted]d by ultrasound admitted for SROM  Subjective:   Objective: BP 100/60   Pulse 61   Temp 98 F (36.7 C) (Oral)   Resp 15   Ht  (1.651 m)   Wt 129 lb (58.5 kg)   LMP 09/26/2016 (Exact Date)   BMI 21.47 kg/m  No intake/output data recorded. No intake/output data recorded.  FHT:  FHR: 120 bpm, variability: moderate,  accelerations:  Present,  decelerations:  Absent UC:   Occasional mild contraction SVE:   Dilation: 1 Effacement (%): Thick Station: -3 Exam by:: Devra Dopp, RN   Labs: Lab Results  Component Value Date   WBC 12.2 (H) 06/18/2017   HGB 11.7 (L) 06/18/2017   HCT 34.7 (L) 06/18/2017   MCV 83.4 06/18/2017   PLT 216 06/18/2017    Assessment / Plan: SROM yet to be in labor  Labor: yet to be in labor Preeclampsia:  no signs or symptoms of toxicity Fetal Wellbeing:  Category I Pain Control:  Labor support without medications I/D:  n/a Anticipated MOD:  NSVD  Wyvonnia Dusky 06/18/2017, 11:55 AM

## 2017-06-18 NOTE — MAU Note (Signed)
Leaking clear fld since 2400 and cont to leak. Having some ctxs.
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
## 2017-06-18 NOTE — MAU Note (Signed)
Johnette RNC in BS called for pt to come from Triage to BS due to SROM and fern positive. Pt cannot come now from [place] but may come in to 164

## 2017-06-19 ENCOUNTER — Encounter (HOSPITAL_COMMUNITY): Payer: Self-pay | Admitting: Obstetrics and Gynecology

## 2017-06-19 DIAGNOSIS — O99824 Streptococcus B carrier state complicating childbirth: Secondary | ICD-10-CM

## 2017-06-19 DIAGNOSIS — O4202 Full-term premature rupture of membranes, onset of labor within 24 hours of rupture: Secondary | ICD-10-CM

## 2017-06-19 DIAGNOSIS — O24419 Gestational diabetes mellitus in pregnancy, unspecified control: Secondary | ICD-10-CM

## 2017-06-19 DIAGNOSIS — Z3A38 38 weeks gestation of pregnancy: Secondary | ICD-10-CM

## 2017-06-19 LAB — GLUCOSE, CAPILLARY: Glucose-Capillary: 78 mg/dL (ref 65–99)

## 2017-06-19 MED ORDER — BENZOCAINE-MENTHOL 20-0.5 % EX AERO: 1.0000 "application " | INHALATION_SPRAY | CUTANEOUS | Status: DC | PRN

## 2017-06-19 MED ORDER — SENNOSIDES-DOCUSATE SODIUM 8.6-50 MG PO TABS
2.0000 | ORAL_TABLET | ORAL | Status: DC
  Administered 2017-06-20: 2 via ORAL
  Filled 2017-06-19 (×2): qty 2

## 2017-06-19 MED ORDER — ONDANSETRON HCL 4 MG PO TABS
4.0000 mg | ORAL_TABLET | ORAL | Status: DC | PRN
  Administered 2017-06-19: 4 mg via ORAL
  Filled 2017-06-19: qty 1

## 2017-06-19 MED ORDER — SIMETHICONE 80 MG PO CHEW: 80.0000 mg | CHEWABLE_TABLET | ORAL | Status: DC | PRN

## 2017-06-19 MED ORDER — IBUPROFEN 600 MG PO TABS
600.0000 mg | ORAL_TABLET | Freq: Four times a day (QID) | ORAL | Status: DC
  Administered 2017-06-19 – 2017-06-21 (×9): 600 mg via ORAL
  Filled 2017-06-19 (×10): qty 1

## 2017-06-19 MED ORDER — COCONUT OIL OIL: 1.0000 "application " | TOPICAL_OIL | Status: DC | PRN

## 2017-06-19 MED ORDER — DIBUCAINE 1 % RE OINT: 1.0000 "application " | TOPICAL_OINTMENT | RECTAL | Status: DC | PRN

## 2017-06-19 MED ORDER — ONDANSETRON HCL 4 MG/2ML IJ SOLN: 4.0000 mg | INTRAMUSCULAR | Status: DC | PRN

## 2017-06-19 MED ORDER — DIPHENHYDRAMINE HCL 25 MG PO CAPS: 25.0000 mg | ORAL_CAPSULE | Freq: Four times a day (QID) | ORAL | Status: DC | PRN

## 2017-06-19 MED ORDER — TETANUS-DIPHTH-ACELL PERTUSSIS 5-2.5-18.5 LF-MCG/0.5 IM SUSP
0.5000 mL | Freq: Once | INTRAMUSCULAR | Status: AC
  Administered 2017-06-21: 0.5 mL via INTRAMUSCULAR

## 2017-06-19 MED ORDER — WITCH HAZEL-GLYCERIN EX PADS: 1.0000 "application " | MEDICATED_PAD | CUTANEOUS | Status: DC | PRN

## 2017-06-19 MED ORDER — ZOLPIDEM TARTRATE 5 MG PO TABS: 5.0000 mg | ORAL_TABLET | Freq: Every evening | ORAL | Status: DC | PRN

## 2017-06-19 MED ORDER — ACETAMINOPHEN 325 MG PO TABS: 650.0000 mg | ORAL_TABLET | ORAL | Status: DC | PRN

## 2017-06-19 MED ORDER — PRENATAL MULTIVITAMIN CH
1.0000 | ORAL_TABLET | Freq: Every day | ORAL | Status: DC
  Administered 2017-06-19 – 2017-06-21 (×3): 1 via ORAL
  Filled 2017-06-19 (×3): qty 1

## 2017-06-19 NOTE — Anesthesia Postprocedure Evaluation (Signed)
Anesthesia Post Note  Patient: Katelyn Martinez  Procedure(s) Performed: AN AD HOC LABOR EPIDURAL     Patient location during evaluation: Mother Baby Anesthesia Type: Epidural Level of consciousness: awake and alert and oriented Pain management: satisfactory to patient Vital Signs Assessment: post-procedure vital signs reviewed and stable Respiratory status: respiratory function stable Cardiovascular status: stable Postop Assessment: no headache, no backache, epidural receding, patient able to bend at knees, no signs of nausea or vomiting and adequate PO intake Anesthetic complications: no    Last Vitals:  Vitals:   06/19/17 0545 06/19/17 1000  BP: 117/64 (!) 100/51  Pulse: 75 69  Resp: 20 18  Temp: 36.9 C 37.1 C  SpO2:  100%    Last Pain:  Vitals:   06/19/17 1200  TempSrc:   PainSc: 3    Pain Goal: Patients Stated Pain Goal: 0 (06/18/17 0122)               [name]

## 2017-06-19 NOTE — Lactation Note (Signed)
This note was copied from a baby's chart. Lactation Consultation Note  Patient Name: Boy Sandrika Schwinn ZOXWR'U Date: 06/19/2017 Reason for consult: Initial assessment  Initial visit at 9 hours of age.  Mom reports baby has returned to bedside and is doing well with breastfeeding.  Baby has previous low blood sugars with unstable temperatures.  Baby is 5#12 oz at [redacted] weeks gestation. Mom reports recent feeding with baby sucking a few times.  LC explained to mom normal feeding is 15-20 minutes with active sucking.   LC encouraged mom to spoon feed baby EBM after feedings to make sure baby is getting enough and to keep blood sugar levels stable. Mom again reports baby is doing good with feedings. LC spoon fed baby of ebm.  Lc assisted with STS for latching.  LC assisted with cross cradle hold. Baby latches well with wide gape sucks a few times and tongue thrusts nipple out of mouth.  LC discussed importance of baby latching 8-12 times in 24 hours and spoon feeding after attempts to allow baby to get enough during feedings, baby is <6#. Mom encouraged to offer a spoonful of EBM with each feeding.   FOB is able to verbalize understanding of feeding frequency.  LC encouraged mom to continue to work on hand expression as it may work better than pump to express colostrum. Mom has hand pump at bedside given by Rn.  Mom to use DEBP as needed if baby does not start frequent feedings with good latching.  Pumping will help protect moms milk supply and offer stimulation to breast if baby is not.  LC reported to [facility] RN, [place], and explained need for follow up bedside support for feedings and supplement with EBM with each feeding.     Maternal Data Has patient been taught Hand Expression?: Yes Does the patient have breastfeeding experience prior to this delivery?: No  Feeding Feeding Type: Breast Fed Length of feed:  (few sucks)  LATCH Score Latch: Repeated attempts needed to sustain latch, nipple held  in mouth throughout feeding, stimulation needed to elicit sucking reflex.  Audible Swallowing: A few with stimulation  Type of Nipple: Everted at rest and after stimulation  Comfort (Breast/Nipple): Soft / non-tender  Hold (Positioning): Assistance needed to correctly position infant at breast and maintain latch.  LATCH Score: 7  Interventions Interventions: Breast feeding basics reviewed;Assisted with latch;Skin to skin;Hand express;Support pillows;Adjust position  Lactation Tools Discussed/Used WIC Program: Yes Aaron Edelman)   Consult Status Consult Status: Follow-up Date: 06/20/17 Follow-up type: In-patient    Jana Shoptaw 06/19/2017, 11:40 AM

## 2017-06-19 NOTE — Anesthesia Postprocedure Evaluation (Signed)
Anesthesia Post Note  Patient: Katelyn Martinez  Procedure(s) Performed: AN AD HOC LABOR EPIDURAL     Anesthesia Post Evaluation  Last Vitals:  Vitals:   06/19/17 0545 06/19/17 1000  BP: 117/64 (!) 100/51  Pulse: 75 69  Resp: 20 18  Temp: 36.9 C 37.1 C  SpO2:  100%    Last Pain:  Vitals:   06/19/17 1200  TempSrc:   PainSc: 3    Pain Goal: Patients Stated Pain Goal: 0 (06/18/17 0122)               Cephus Shelling

## 2017-06-20 ENCOUNTER — Encounter: Payer: Medicaid Other | Admitting: Women's Health

## 2017-06-20 LAB — GLUCOSE, RANDOM: Glucose, Bld: 74 mg/dL (ref 65–99)

## 2017-06-20 NOTE — Plan of Care (Signed)
Problem: Role Relationship: Goal: Ability to demonstrate positive interaction with newborn will improve Outcome: Completed/Met Date Met: 06/20/17 Patient is bonding well with newborn.    

## 2017-06-20 NOTE — Lactation Note (Signed)
This note was copied from a baby's chart. Lactation Consultation Note  Patient Name: Katelyn Martinez [id number] Date: 06/20/2017 Reason for consult: Follow-up assessment Baby at 36 hr of life. Upon entry baby was sleeping in the bed with mom who was awake waiting on food. Mom reports baby has been latching well and taking the syringe feeding well. She denies breast or nipple pain, voiced no concerns. She has not been using any of the pumps because she has been "sleeping". She stated she is too tired to express milk. Discussed baby behavior, feeding frequency, baby belly size, voids, wt loss, breast changes, and nipple care. Encouraged mom to call at the next bf for lactation to view a feeding.  Mom will offer the breast on demand q3hr, post express, and offer supplement per volume guidelines.     Maternal Data    Feeding Feeding Type: Breast Milk Length of feed: 5 min  LATCH Score                   Interventions    Lactation Tools Discussed/Used     Consult Status Consult Status: Follow-up Date: 06/21/17 Follow-up type: In-patient    Rulon Eisenmenger 06/20/2017, 2:38 PM

## 2017-06-20 NOTE — Discharge Instructions (Signed)

## 2017-06-20 NOTE — Progress Notes (Signed)
Post Partum Day 1  Subjective:  Katelyn Martinez is a 21 y.o. G1P1001 [redacted]w[redacted]d s/p SVD.  No acute events overnight.  Pt denies problems with ambulating, voiding or po intake.  She denies nausea or vomiting.  Pain is well controlled.  She has had flatus.2  Lochia Moderate.  Plan for birth control is nexplanon.  Method of Feeding: both breast and bottle.  Objective: BP 115/65 (BP Location: Left Arm)   Pulse (!) 55   Temp 97.8 F (36.6 C) (Oral)   Resp 16   Ht  (1.651 m)   Wt 58.5 kg (129 lb)   LMP 09/26/2016 (Exact Date)   SpO2 100%   Breastfeeding? Unknown   BMI 21.47 kg/m   Physical Exam:  General: alert, cooperative and no distress Lochia:normal flow Chest: normal respiratory effort Abdomen: +BS, soft, nontender, fundus firm at/below umbilicus Uterine Fundus: firm DVT Evaluation: No evidence of DVT seen on physical exam. Extremities: no edema or tenderness to palpation   Recent Labs  06/18/17 0210  HGB 11.7*  HCT 34.7*    Assessment/Plan:  ASSESSMENT: Katelyn Martinez is a 21 y.o. G1P1001 [redacted]w[redacted]d ppd #1 s/p NSVD doing well.   Baby is losing weight, and needs to be monitored longer.   Plan for discharge tomorrow and Breastfeeding   LOS: 2 days   Katelyn Martinez 06/20/2017, 9:05 AM
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
## 2017-06-21 ENCOUNTER — Telehealth: Payer: Self-pay | Admitting: Women's Health

## 2017-06-21 MED ORDER — PRENATAL MULTIVITAMIN CH: 1.0000 | ORAL_TABLET | Freq: Every day | ORAL | 0 refills | Status: DC

## 2017-06-21 MED ORDER — SENNOSIDES-DOCUSATE SODIUM 8.6-50 MG PO TABS: 2.0000 | ORAL_TABLET | Freq: Every evening | ORAL | 0 refills | Status: DC | PRN

## 2017-06-21 MED ORDER — IBUPROFEN 600 MG PO TABS: 600.0000 mg | ORAL_TABLET | Freq: Four times a day (QID) | ORAL | 0 refills | Status: DC

## 2017-06-21 MED ORDER — INFLUENZA VAC SPLIT QUAD 0.5 ML IM SUSY
0.5000 mL | PREFILLED_SYRINGE | INTRAMUSCULAR | Status: AC
  Administered 2017-06-21: 0.5 mL via INTRAMUSCULAR

## 2017-06-21 NOTE — Telephone Encounter (Signed)
Patient called stating that she just delivered and she states that wic does not offer the same milk she was given at the hospital and would like to know if another formula is okay to give. Please contact pt

## 2017-06-21 NOTE — Telephone Encounter (Signed)
Advised to call pediatrician about formula. Pt states baby has an appt tomorrow and she will ask then. JSY

## 2017-06-21 NOTE — Lactation Note (Addendum)
This note was copied from a baby's chart. Lactation Consultation Note  Patient Name: Katelyn Martinez Date: 06/21/2017 Reason for consult: Follow-up assessment;Infant < 6lbs   Baby 56 hours old < 6 lbs. Mother pumping with hand pump upon entering expressing good flow of colostrum. She states baby breastfed for 15 min earlier on L breast and fell asleep. Discussed supplementing after feedings, increasing volume per day of life. Mother has volume guidelines. Mother plans to go to [facility] in [place] to get DEBP. Suggest pumping 4-5 times per day for 10-20 min. Discussed slow flow nipples if family decided and paced feeding. Reviewed engorgement care and monitoring voids/stools.   Mother had pumped 13 ml with manual pump. Suggest she give to baby with slow flow nipple. After baby latched in cradle hold.  Demonstrated how to latch in cross cradle for more depth and provided pillows for support.  Sucks and swallow observed.     Maternal Data Has patient been taught Hand Expression?: Yes  Feeding Feeding Type: Breast Fed Length of feed: 15 min  LATCH Score                   Interventions    Lactation Tools Discussed/Used Tools: Pump   Consult Status Consult Status: Complete    Hardie Pulley 06/21/2017, 10:45 AM

## 2017-06-21 NOTE — Discharge Summary (Signed)
OB Discharge Summary     Patient Name: Katelyn Martinez DOB: April 10, 1996 MRN: [id number]  Date of admission: 06/18/2017 Delivering MD: [name] Y   Date of discharge: 06/21/2017  Admitting diagnosis: 37wks, water broke Intrauterine pregnancy: [redacted]w[redacted]d     Secondary diagnosis:  Active Problems:   PROM (premature rupture of membranes)   SVD (spontaneous vaginal delivery)  Additional problems: Gestational Diabetes Mellitus (diet-controlled), Varicella non-immune     Discharge diagnosis: Term Pregnancy Delivered                                                                                                Post partum procedures:none  Augmentation: Pitocin  Complications: None  Hospital course:  Onset of Labor With Vaginal Delivery     21 y.o. yo G1P1001 at [redacted]w[redacted]d was admitted for PROM in Latent Labor on 06/18/2017. Patient had an uncomplicated labor course as follows:  Membrane Rupture Time/Date: 11:45 AM ,06/17/2017   Intrapartum Procedures: Episiotomy: None [1]                                         Lacerations:  Perineal [11]  Patient had a delivery of a Viable infant. 06/19/2017  Information for the patient's newborn:  Eboney, [place] [[id number]]  Delivery Method: Vaginal, Spontaneous Delivery (Filed from Delivery Summary)    Pateint had an uncomplicated postpartum course.  She is ambulating, tolerating a regular diet, passing flatus, and urinating well. Patient is discharged home in stable condition on 06/21/17.   Physical exam  Vitals:   06/19/17 1800 06/20/17 0540 06/20/17 1841 06/21/17 0520  BP: 116/70 115/65 (!) 104/58 113/72  Pulse: 72 (!) 55 74 66  Resp: Temp: 98.3 F (36.8 C) 97.8 F (36.6 C) 98.4 F (36.9 C) 97.9 F (36.6 C)  TempSrc: Oral Oral Oral Oral  SpO2: 100%   99%  Weight:      Height:       General: alert, cooperative and no distress Lochia: appropriate Uterine Fundus: firm Incision: N/A DVT Evaluation: No evidence of DVT  seen on physical exam. No significant calf/ankle edema. Labs: Lab Results  Component Value Date   WBC 12.2 (H) 06/18/2017   HGB 11.7 (L) 06/18/2017   HCT 34.7 (L) 06/18/2017   MCV 83.4 06/18/2017   PLT 216 06/18/2017   CMP Latest Ref Rng & Units 06/20/2017  Glucose 65 - 99 mg/dL 74  BUN 6 - 20 mg/dL -  Creatinine 0.86 - 5.78 mg/dL -  Sodium 469 - 629 mmol/L -  Potassium 3.5 - 5.1 mmol/L -  Chloride 101 - 111 mmol/L -  CO2 22 - 32 mmol/L -  Calcium 8.9 - 10.3 mg/dL -  Total Protein 6.5 - 8.1 g/dL -  Total Bilirubin 0.3 - 1.2 mg/dL -  Alkaline Phos 38 - 528 U/L -  AST 15 - 41 U/L -  ALT 14 - 54 U/L -    Discharge instruction: per After Visit Summary and "  Baby and Me Booklet".  After visit meds:  Allergies as of 06/21/2017   No Known Allergies     Medication List    TAKE these medications   acetaminophen 325 MG tablet Commonly known as:  TYLENOL Take 650 mg by mouth every 6 (six) hours as needed for moderate pain.   butalbital-acetaminophen-caffeine 50-325-40 MG tablet Commonly known as:  FIORICET, ESGIC Take 1 tablet by mouth every 6 (six) hours as needed for headache.   ibuprofen 600 MG tablet Commonly known as:  ADVIL,MOTRIN Take 1 tablet (600 mg total) by mouth every 6 (six) hours.   omeprazole 20 MG capsule Commonly known as:  PRILOSEC Take 1 capsule (20 mg total) by mouth daily.   prenatal multivitamin Tabs tablet Take 1 tablet by mouth daily at 12 noon.   senna-docusate 8.6-50 MG tablet Commonly known as:  Senokot-S Take 2 tablets by mouth at bedtime as needed for mild constipation.       Diet: routine diet  Activity: Advance as tolerated. Pelvic rest for 6 weeks.   Outpatient follow up:4 weeks Follow up Appt: Future Appointments Date Time Provider Department Center  07/18/2017 10:30 AM Cheral Marker, CNM [facility]   Follow up Visit:No Follow-up on file.  Postpartum contraception: Nexplanon  Newborn Data: Live born female   Birth Weight: 5 lb 12.2 oz (2614 g) APGAR: 9, 9  Newborn Delivery   Birth date/time:  06/19/2017 02:25:00 Delivery type:  Vaginal, Spontaneous Delivery      Baby Feeding: Both breast and bottle Circumcision: Baby to be taken to urologist for circumcision, due to penile torsion.  Disposition:home with mother  06/21/2017 Josephine Igo, Medical Student  OB FELLOW DISCHARGE ATTESTATION  I have seen and examined this patient. I agree with above documentation and have made edits as needed.   [name], DO OB Fellow

## 2017-07-18 ENCOUNTER — Encounter: Payer: Self-pay | Admitting: Women's Health

## 2017-07-18 ENCOUNTER — Ambulatory Visit (INDEPENDENT_AMBULATORY_CARE_PROVIDER_SITE_OTHER): Payer: Medicaid Other | Admitting: Women's Health

## 2017-07-18 DIAGNOSIS — Z8632 Personal history of gestational diabetes: Secondary | ICD-10-CM | POA: Diagnosis not present

## 2017-07-18 DIAGNOSIS — Z30011 Encounter for initial prescription of contraceptive pills: Secondary | ICD-10-CM

## 2017-07-18 DIAGNOSIS — Z3202 Encounter for pregnancy test, result negative: Secondary | ICD-10-CM

## 2017-07-18 LAB — POCT URINE PREGNANCY: Preg Test, Ur: NEGATIVE

## 2017-07-18 MED ORDER — NORETHINDRONE 0.35 MG PO TABS
1.0000 | ORAL_TABLET | Freq: Every day | ORAL | 11 refills | Status: DC
Start: 2017-07-18 — End: 2018-04-03

## 2017-07-18 NOTE — Patient Instructions (Signed)
You will have your sugar test next visit.  Please do not eat or drink anything after midnight the night before you come, not even water.  You will be here for at least two hours.     Set alarm to help remind you to take pills  Norethindrone tablets (contraception) What is this medicine? NORETHINDRONE (nor eth IN drone) is an oral contraceptive. The product contains a female hormone known as a progestin. It is used to prevent pregnancy. This medicine may be used for other purposes; ask your health care provider or pharmacist if you have questions. COMMON BRAND NAME(S): Camila, Deblitane 28-Day, Errin, Heather, TrillaJencycla, Jolivette, CecilLyza, Nor-QD, Nora-BE, Norlyroc, Ortho Micronor, Hewlett[employer]ardSharobel 28-Day What should I tell my health care provider before I take this medicine? They need to know if you have any of these conditions: -blood vessel disease or blood clots -breast, cervical, or vaginal cancer -diabetes -heart disease -kidney disease -liver disease -mental depression -migraine -seizures -stroke -vaginal bleeding -an unusual or allergic reaction to norethindrone, other medicines, foods, dyes, or preservatives -pregnant or trying to get pregnant -breast-feeding How should I use this medicine? Take this medicine by mouth with a glass of water. You may take it with or without food. Follow the directions on the prescription label. Take this medicine at the same time each day and in the order directed on the package. Do not take your medicine more often than directed. Contact your pediatrician regarding the use of this medicine in children. Special care may be needed. This medicine has been used in female children who have started having menstrual periods. A patient package insert for the product will be given with each prescription and refill. Read this sheet carefully each time. The sheet may change frequently. Overdosage: If you think you have taken too much of this medicine contact a poison  control center or emergency room at once. NOTE: This medicine is only for you. Do not share this medicine with others. What if I miss a dose? Try not to miss a dose. Every time you miss a dose or take a dose late your chance of pregnancy increases. When 1 pill is missed (even if only 3 hours late), take the missed pill as soon as possible and continue taking a pill each day at the regular time (use a back up method of birth control for the next 48 hours). If more than 1 dose is missed, use an additional birth control method for the rest of your pill pack until menses occurs. Contact your health care professional if more than 1 dose has been missed. What may interact with this medicine? Do not take this medicine with any of the following medications: -amprenavir or fosamprenavir -bosentan This medicine may also interact with the following medications: -antibiotics or medicines for infections, especially rifampin, rifabutin, rifapentine, and griseofulvin, and possibly penicillins or tetracyclines -aprepitant -barbiturate medicines, such as phenobarbital -carbamazepine -felbamate -modafinil -oxcarbazepine -phenytoin -ritonavir or other medicines for HIV infection or AIDS -St. John's wort -topiramate This list may not describe all possible interactions. Give your health care provider a list of all the medicines, herbs, non-prescription drugs, or dietary supplements you use. Also tell them if you smoke, drink alcohol, or use illegal drugs. Some items may interact with your medicine. What should I watch for while using this medicine? Visit your doctor or health care professional for regular checks on your progress. You will need a regular breast and pelvic exam and Pap smear while on this medicine. Use  an additional method of birth control during the first cycle that you take these tablets. If you have any reason to think you are pregnant, stop taking this medicine right away and contact your  doctor or health care professional. If you are taking this medicine for hormone related problems, it may take several cycles of use to see improvement in your condition. This medicine does not protect you against HIV infection (AIDS) or any other sexually transmitted diseases. What side effects may I notice from receiving this medicine? Side effects that you should report to your doctor or health care professional as soon as possible: -breast tenderness or discharge -pain in the abdomen, chest, groin or leg -severe headache -skin rash, itching, or hives -sudden shortness of breath -unusually weak or tired -vision or speech problems -yellowing of skin or eyes Side effects that usually do not require medical attention (report to your doctor or health care professional if they continue or are bothersome): -changes in sexual desire -change in menstrual flow -facial hair growth -fluid retention and swelling -headache -irritability -nausea -weight gain or loss This list may not describe all possible side effects. Call your doctor for medical advice about side effects. You may report side effects to FDA at 1-800-FDA-1088. Where should I keep my medicine? Keep out of the reach of children. Store at room temperature between 15 and 30 degrees C (59 and 86 degrees F). Throw away any unused medicine after the expiration date. NOTE: This sheet is a summary. It may not cover all possible information. If you have questions about this medicine, talk to your doctor, pharmacist, or health care provider.  2018 Elsevier/Gold Standard (2012-05-25 16:41:35)

## 2017-07-18 NOTE — Progress Notes (Signed)
   POSTPARTUM VISIT Patient name: Katelyn Martinez MRN [id number]  Date of birth: 04/26/1996 Chief Complaint:   Postpartum Care  History of Present Illness:   Katelyn BuddyStephanie K Hach is a 21 y.o. 671P1001 Caucasian female being seen today for a postpartum visit. She is 4 weeks postpartum following a spontaneous vaginal delivery at 38.0 gestational weeks after IOL for PROM. Anesthesia: epidural. I have fully reviewed the prenatal and intrapartum course. Pregnancy complicated by A1DM. Postpartum course has been uncomplicated. Bleeding no bleeding. Bowel function is normal. Bladder function is normal.  Patient is not sexually active. Last sexual activity: prior to birth of baby.  Contraception method is wants POPs.  [place] Postpartum Depression Screening: negative. Score 0.  Her father died shortly after baby born, had esophageal cancer Last pap <21yo.  Results were n/a .  No LMP recorded.  Baby's course has been uncomplicated. Baby is feeding by breast & bottle.  Review of Systems:   Pertinent items are noted in HPI Denies Abnormal vaginal discharge w/ itching/odor/irritation, headaches, visual changes, shortness of breath, chest pain, abdominal pain, severe nausea/vomiting, or problems with urination or bowel movements. Pertinent History Reviewed:  Reviewed past medical,surgical, obstetrical and family history.  Reviewed problem list, medications and allergies. OB History  Gravida Para Term Preterm AB Living  1 1 1     1   SAB TAB Ectopic Multiple Live Births        0 1    # Outcome Date GA Lbr Len/2nd Weight Sex Delivery Anes PTL Lv  1 Term 06/19/17 [id number] 04:41 / 00:21 5 lb 12.2 oz (2.614 kg) M Vag-Spont EPI  LIV     Physical Assessment:   Vitals:   07/18/17 1104  BP: (!) 102/54  Pulse: 80  Weight: 113 lb (51.3 kg)  Height: 5\' 5"  (1.651 m)  Body mass index is 18.8 kg/m.       Physical Examination:   General appearance: alert, well appearing, and in no distress  Mental status:  alert, oriented to person, place, and time  Skin: warm & dry   Cardiovascular: normal heart rate noted   Respiratory: normal respiratory effort, no distress   Breasts: deferred, no complaints   Abdomen: soft, non-tender   Pelvic: declined, no problems  Rectal: declined, no problems  Extremities: No edema       Results for orders placed or performed in visit on 07/18/17 (from the past 24 hour(s))  POCT urine pregnancy   Collection Time: 07/18/17 11:12 AM  Result Value Ref Range   Preg Test, Ur Negative Negative    Assessment & Plan:  1) Postpartum exam 2) 4 wks s/p SVB after IOL for PROM 3) Breast & Bottlefeeding 4) Depression screening 5) Contraception counseling, pt prefers oral progesterone-only contraceptive, Rx micronor w/ 11RF, understands has to take at exact same time daily to be effective, if late taking use condom as back-up  6) A1DM during pregnancy> will get 8wk 2hr GTT  Follow-up: Return in about 4 weeks (around 08/15/2017) for for pp 2hr sugar test (no visit), then after 6/7 for , Pap & physical.   Orders Placed This Encounter  Procedures  . POCT urine pregnancy    Marge DuncansBooker, Diannia Hogenson Randall CNM, [facility]NP-BC 07/18/2017 11:50 AM

## 2017-08-15 ENCOUNTER — Telehealth: Payer: Self-pay | Admitting: *Deleted

## 2017-08-15 ENCOUNTER — Other Ambulatory Visit: Payer: Medicaid Other

## 2017-08-15 DIAGNOSIS — Z8632 Personal history of gestational diabetes: Secondary | ICD-10-CM

## 2017-08-15 NOTE — Telephone Encounter (Signed)
Informed boyfriend to have patient call back. [facility] 07[facility]18/17.

## 2017-08-16 LAB — GLUCOSE TOLERANCE, 2 HOURS W/ 1HR
Glucose, 1 hour: 88 mg/dL (ref 65–179)
Glucose, 2 hour: 64 mg/dL — ABNORMAL LOW (ref 65–152)
Glucose, Fasting: 72 mg/dL (ref 65–91)

## 2017-08-31 DIAGNOSIS — H5203 Hypermetropia, bilateral: Secondary | ICD-10-CM | POA: Diagnosis not present

## 2018-03-15 ENCOUNTER — Encounter: Payer: Self-pay | Admitting: Women's Health

## 2018-03-15 ENCOUNTER — Ambulatory Visit: Payer: Medicaid Other | Admitting: Women's Health

## 2018-03-15 VITALS — BP 89/56 | HR 75 | Ht 65.0 in | Wt 106.0 lb

## 2018-03-15 DIAGNOSIS — Z3202 Encounter for pregnancy test, result negative: Secondary | ICD-10-CM | POA: Diagnosis not present

## 2018-03-15 DIAGNOSIS — Z3009 Encounter for other general counseling and advice on contraception: Secondary | ICD-10-CM | POA: Diagnosis not present

## 2018-03-15 LAB — POCT URINE PREGNANCY: Preg Test, Ur: NEGATIVE

## 2018-03-15 NOTE — Patient Instructions (Signed)
NO SEX UNTIL AFTER YOU GET YOUR BIRTH CONTROL  

## 2018-03-15 NOTE — Progress Notes (Signed)
   GYN VISIT Patient name: Katelyn Martinez Fluty MRN [id number]  Date of birth: 10/21/1995 Chief Complaint:   discuss birth control  History of Present Illness:   Katelyn Martinez Dibenedetto is a 22 y.o. 191P1001 Caucasian female being seen today to discuss changing birth control. Baby is 9mths old, was breastfeeding and taking POPs, stopped breastfeeding at 6mths, Verlin DikeMcaid ran out, so stopped POPs. Has been having unprotected sex, last time was last night. Last period about 2wks ago. Wants Nexplanon.      Patient's last menstrual period was 03/02/2018. The current method of family planning is none. Last pap just turned 22yo. Results were:  n/a Review of Systems:   Pertinent items are noted in HPI Denies fever/chills, dizziness, headaches, visual disturbances, fatigue, shortness of breath, chest pain, abdominal pain, vomiting, abnormal vaginal discharge/itching/odor/irritation, problems with periods, bowel movements, urination, or intercourse unless otherwise stated above.  Pertinent History Reviewed:  Reviewed past medical,surgical, social, obstetrical and family history.  Reviewed problem list, medications and allergies. Physical Assessment:   Vitals:   03/15/18 0837  BP: (!) 89/56  Pulse: 75  Weight: 106 lb (48.1 kg)  Height: 5\' 5"  (1.651 m)  Body mass index is 17.64 kg/m.       Physical Examination:   General appearance: alert, well appearing, and in no distress  Mental status: alert, oriented to person, place, and time  Skin: warm & dry   Cardiovascular: normal heart rate noted  Respiratory: normal respiratory effort, no distress  Abdomen: soft, non-tender   Pelvic: examination not indicated  Extremities: no edema   Results for orders placed or performed in visit on 03/15/18 (from the past 24 hour(s))  POCT urine pregnancy   Collection Time: 03/15/18  8:47 AM  Result Value Ref Range   Preg Test, Ur Negative Negative    Assessment & Plan:  1) Contraception counseling> wants Nexplanon,  order today, abstinence until insertion  Meds: No orders of the defined types were placed in this encounter.   Orders Placed This Encounter  Procedures  . POCT urine pregnancy    Return in about 3 weeks (around 04/05/2018) for Nexplanon insertion, order today please: then 1mth for pap & physical.  Cheral MarkerKimberly R Renton Berkley CNM, WHNP-BC 03/15/2018 9:04 AM

## 2018-04-03 ENCOUNTER — Ambulatory Visit (INDEPENDENT_AMBULATORY_CARE_PROVIDER_SITE_OTHER): Payer: Medicaid Other | Admitting: Adult Health

## 2018-04-03 ENCOUNTER — Encounter: Payer: Self-pay | Admitting: Adult Health

## 2018-04-03 VITALS — BP 93/54 | HR 80 | Ht 65.0 in | Wt 105.0 lb

## 2018-04-03 DIAGNOSIS — Z3049 Encounter for surveillance of other contraceptives: Secondary | ICD-10-CM | POA: Diagnosis not present

## 2018-04-03 DIAGNOSIS — Z3202 Encounter for pregnancy test, result negative: Secondary | ICD-10-CM

## 2018-04-03 DIAGNOSIS — Z30017 Encounter for initial prescription of implantable subdermal contraceptive: Secondary | ICD-10-CM

## 2018-04-03 HISTORY — DX: Encounter for initial prescription of implantable subdermal contraceptive: Z30.017

## 2018-04-03 LAB — POCT URINE PREGNANCY: Preg Test, Ur: NEGATIVE

## 2018-04-03 MED ORDER — ETONOGESTREL 68 MG ~~LOC~~ IMPL
68.0000 mg | DRUG_IMPLANT | Freq: Once | SUBCUTANEOUS | Status: AC
Start: 2018-04-03 — End: 2018-04-03
  Administered 2018-04-03: 68 mg via SUBCUTANEOUS

## 2018-04-03 NOTE — Progress Notes (Addendum)
  Subjective:     Patient ID: Katelyn Martinez, female   DOB: 07/08/1996, 22 y.o.   MRN: [id number]  HPI Katelyn Martinez is a 22 year old white female in for nexplanon insertion.   Review of Systems Patient denies any headaches, hearing loss, fatigue, blurred vision, shortness of breath, chest pain, abdominal pain, problems with bowel movements, urination, or intercourse. No joint pain or mood swings. Reviewed past medical,surgical, social and family history. Reviewed medications and allergies.     Objective:   Physical Exam BP (!) 93/54 (BP Location: Left Arm, Patient Position: Sitting, Cuff Size: Normal)   Pulse 80   Ht 5\' 5"  (1.651 m)   Wt 105 lb (47.6 kg)   LMP 03/28/2018 (Exact Date)   Breastfeeding? No   BMI 17.47 kg/m UPT negative, no sex since last period. Consent signed, time out called. Right arm cleansed with betadine, and injected with 1.5 cc 1% lidocaine and waited til numb. Nexplanon easily inserted and steri strips applied.Rod easily palpated by provider and pt. Pressure dressing applied.    Assessment:    Lot # [id number] exp 03/2020 1. Nexplanon insertion   2. Pregnancy examination or test, negative result       Plan:     Use condoms x 2 weeks, keep clean and dry x 24 hours, no heavy lifting, keep steri strips on x 72 hours, Keep pressure dressing on x 24 hours. Follow up prn problems. Pap and physical as scheduled with Selena BattenKim

## 2018-04-03 NOTE — Patient Instructions (Addendum)
Use condoms x 2 weeks, keep clean and dry x 24 hours, no heavy lifting, keep steri strips on x 72 hours, Keep pressure dressing on x 24 hours. Follow up prn problems.  

## 2018-04-12 ENCOUNTER — Other Ambulatory Visit: Payer: Medicaid Other | Admitting: Women's Health

## 2018-05-23 ENCOUNTER — Other Ambulatory Visit: Payer: Medicaid Other | Admitting: Women's Health

## 2018-09-17 ENCOUNTER — Emergency Department (HOSPITAL_COMMUNITY)
Admission: EM | Admit: 2018-09-17 | Discharge: 2018-09-17 | Disposition: A | Discharge status: Home / Self Care | Payer: Medicaid Other | Attending: Emergency Medicine | Admitting: Emergency Medicine

## 2018-09-17 ENCOUNTER — Other Ambulatory Visit: Payer: Self-pay

## 2018-09-17 ENCOUNTER — Encounter (HOSPITAL_COMMUNITY): Payer: Self-pay

## 2018-09-17 DIAGNOSIS — J101 Influenza due to other identified influenza virus with other respiratory manifestations: Secondary | ICD-10-CM | POA: Insufficient documentation

## 2018-09-17 DIAGNOSIS — F419 Anxiety disorder, unspecified: Secondary | ICD-10-CM | POA: Insufficient documentation

## 2018-09-17 DIAGNOSIS — Z7722 Contact with and (suspected) exposure to environmental tobacco smoke (acute) (chronic): Secondary | ICD-10-CM | POA: Insufficient documentation

## 2018-09-17 DIAGNOSIS — J45909 Unspecified asthma, uncomplicated: Secondary | ICD-10-CM | POA: Insufficient documentation

## 2018-09-17 LAB — INFLUENZA PANEL BY PCR (TYPE A & B)
Influenza A By PCR: NEGATIVE
Influenza B By PCR: POSITIVE — AB

## 2018-09-17 MED ORDER — IBUPROFEN 800 MG PO TABS
800.0000 mg | ORAL_TABLET | Freq: Once | ORAL | Status: AC
  Administered 2018-09-17: 800 mg via ORAL
  Filled 2018-09-17: qty 1

## 2018-09-17 MED ORDER — ACETAMINOPHEN 325 MG PO TABS
650.0000 mg | ORAL_TABLET | Freq: Once | ORAL | Status: AC
  Administered 2018-09-17: 650 mg via ORAL
  Filled 2018-09-17: qty 2

## 2018-09-17 MED ORDER — FLUTICASONE PROPIONATE 50 MCG/ACT NA SUSP: 1.0000 | Freq: Every day | NASAL | 2 refills | Status: DC

## 2018-09-17 MED ORDER — CETIRIZINE-PSEUDOEPHEDRINE ER 5-120 MG PO TB12: 1.0000 | ORAL_TABLET | Freq: Every day | ORAL | 0 refills | Status: DC

## 2018-09-17 NOTE — ED Triage Notes (Signed)
Pt c/o fever, body aches, cough, and headaches since last night.  Reports family has had the flu recently.

## 2018-09-17 NOTE — Discharge Instructions (Addendum)
Your flu test is positive for influenza type B. Continue with Motrin and Tylenol at home as directed to help with body aches, fevers, chills. Use Flonase, saline sinus rinse, Zyrtec-D as prescribed and as directed. Drink plenty of hydrating fluids such as G2 or Gatorade. Call your child's doctor and discuss you flu test results with your child's provider.  Wash your hands frequently, wear a mask to try to prevent spread of the flu.

## 2018-09-17 NOTE — ED Provider Notes (Signed)
[facility]ERGENCY DEPARTMENT Provider Note   CSN: [id number] Arrival date & time: 09/17/18  1257     History   Chief Complaint Chief Complaint  Patient presents with  . Fever    HPI Katelyn Martinez is a 22 y.o. female.  22 year old female presents with complaint of cough, congestion, body aches, headache, lack of appetite and feeling feverish.  Patient is unsure when her symptoms started, possibly 3 days ago.  Patient is exposed to people with the flu however that was 2 weeks ago.  Patient is concerned that she has the flu as she has a healthy 784-month-old child at home and would like to test today.     Past Medical History:  Diagnosis Date  . Asthma   . 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 Nexplanon insertion 04/03/2018   Inserted right arm 04/03/18  . Recurrent upper respiratory infection (URI)   . Trauma 12/22/2014   [facility]     Patient Active Problem List   Diagnosis Date Noted  . Nexplanon insertion 04/03/2018  . History of gestational diabetes 04/24/2017  . Susceptible to varicella (non-immune), currently pregnant 12/19/2016  . Tibial plateau fracture, right 12/24/2014  . Fracture of right fibula 12/24/2014  . Back abrasion 12/24/2014  . Lumbar transverse process fracture (HCC) 12/24/2014  . Asthma 12/24/2014  . Anxiety disorder 12/24/2014  . Intracranial hemorrhage (HCC)   . Pneumocephalus, traumatic   . Pulmonary contusion   . Temporal bone fracture (HCC)   . Trauma   . Basilar skull fracture (HCC) 12/23/2014  . Fall by pediatric patient     Past Surgical History:  Procedure Laterality Date  . BLADDER SURGERY     uteral reimplantation  . ORIF TIBIA PLATEAU Right 12/29/2014   Procedure: OPEN REDUCTION INTERNAL FIXATION (ORIF) TIBIAL PLATEAU;  Surgeon: Sheral Apleyimothy D Eileen Croswell, MD;  Location: [facility] OR;  Service: Orthopedics;  Laterality: Right;  . SALIVARY GLAND SURGERY    . TONSILLECTOMY       OB History    Gravida    1   Para  1   Term  1   Preterm      AB      Living  1     SAB      TAB      Ectopic      Multiple  0   Live Births  1            Home Medications    Prior to Admission medications   Medication Sig Start Date End Date Taking? Authorizing Provider  cetirizine-pseudoephedrine (ZYRTEC-D) 5-120 MG tablet Take 1 tablet by mouth daily. 09/17/18   Jeannie FendMurphy, Kees Idrovo A, PA-C  fluticasone (FLONASE) 50 MCG/ACT nasal spray Place 1 spray into both nostrils daily. 09/17/18   Jeannie FendMurphy, Genesia Caslin A, PA-C    Family History Family History  Problem Relation Age of Onset  . Ovarian cysts Mother   . Endometriosis Mother   . Fibromyalgia Mother   . Hepatitis C Mother   . Diabetes Mother   . Cancer Maternal Aunt        ovarian  . Cervical cancer Maternal Aunt   . Cancer Maternal Grandfather        bladder  . Cancer Paternal Grandmother        lung  . Bipolar disorder Father   . Suicidality Paternal Grandfather   . Cancer Maternal Grandmother   . Other Sister  mood disorder; patella femoral syndrome    Social History Social History   Tobacco Use  . Smoking status: Passive Smoke Exposure - Never Smoker  . Smokeless tobacco: Never Used  Substance Use Topics  . Alcohol use: No  . Drug use: Yes    Types: Marijuana    Comment: denies any at this time-hx reports marijuana     Allergies   Patient has no known allergies.   Review of Systems Review of Systems  Constitutional: Positive for fever.  HENT: Positive for congestion. Negative for postnasal drip, rhinorrhea, sinus pressure, sinus pain and sneezing.   Eyes: Negative for discharge and redness.  Respiratory: Positive for cough.   Gastrointestinal: Positive for nausea. Negative for abdominal pain, constipation, diarrhea and vomiting.  Genitourinary: Negative for dysuria, frequency and urgency.  Musculoskeletal: Positive for arthralgias and myalgias.  Skin: Negative for rash.  Allergic/Immunologic: Negative for  immunocompromised state.  Neurological: Positive for headaches.  Hematological: Negative for adenopathy.  Psychiatric/Behavioral: Negative for confusion.  All other systems reviewed and are negative.    Physical Exam Updated Vital Signs BP 109/67 (BP Location: Right Arm)   Pulse (!) 109   Temp 100.2 F (37.9 C) (Oral)   Resp 18   Ht 5\' 5"  (1.651 m)   LMP 08/27/2018 (Approximate)   SpO2 95%   Breastfeeding No   BMI 17.47 kg/m   Physical Exam Vitals signs and nursing note reviewed.  Constitutional:      General: She is not in acute distress.    Appearance: She is well-developed. She is not diaphoretic.  HENT:     Head: Normocephalic and atraumatic.     Right Ear: Tympanic membrane and ear canal normal.     Left Ear: Tympanic membrane and ear canal normal.     Nose: Congestion present. No rhinorrhea.     Mouth/Throat:     Mouth: Mucous membranes are moist.     Pharynx: No oropharyngeal exudate or posterior oropharyngeal erythema.  Eyes:     General:        Right eye: No discharge.        Left eye: No discharge.  Neck:     Musculoskeletal: Neck supple.  Cardiovascular:     Rate and Rhythm: Normal rate and regular rhythm.     Pulses: Normal pulses.     Heart sounds: No murmur.  Pulmonary:     Effort: Pulmonary effort is normal.     Breath sounds: Normal breath sounds. No wheezing.  Lymphadenopathy:     Cervical: No cervical adenopathy.  Skin:    General: Skin is warm and dry.     Findings: No rash.  Neurological:     Mental Status: She is alert and oriented to person, place, and time.  Psychiatric:        Behavior: Behavior normal.      ED Treatments / Results  Labs (all labs ordered are listed, but only abnormal results are displayed) Labs Reviewed  INFLUENZA PANEL BY PCR (TYPE A & B) - Abnormal; Notable for the following components:      Result Value   Influenza B By PCR POSITIVE (*)    All other components within normal limits     EKG None  Radiology No results found.  Procedures Procedures (including critical care time)  Medications Ordered in ED Medications  ibuprofen (ADVIL,MOTRIN) tablet 800 mg (800 mg Oral Given 09/17/18 1505)  acetaminophen (TYLENOL) tablet 650 mg (650 mg Oral Given 09/17/18 1505)  Initial Impression / Assessment and Plan / ED Course  I have reviewed the triage vital signs and the nursing notes.  Pertinent labs & imaging results that were available during my care of the patient were reviewed by me and considered in my medical decision making (see chart for details).  Clinical Course as of Sep 18 1551  Mon Sep 17, 2018  4057 22 year old female with flulike illness x3 to 4 days.  Patient is well-appearing the lung sounds are clear, has not taken anything for her symptoms.  Patient has a 63-month-old well-child at home and would like a flu test.  Patient's flu test is positive for influenza B.  Patient was given Motrin and Tylenol for her symptoms while in the ER.  Recommend she continue with same, also given prescription for Flonase and Zyrtec-D.  Recommend hydrating fluids.  Follow-up with PCP, return to ER for new or worsening symptoms.  Patient may call her child provider to discuss her positive result with them.   [LM]    Clinical Course User Index [LM] Jeannie Fend, PA-C   Final Clinical Impressions(s) / ED Diagnoses   Final diagnoses:  Influenza B    ED Discharge Orders         Ordered    fluticasone (FLONASE) 50 MCG/ACT nasal spray  Daily     09/17/18 1549    cetirizine-pseudoephedrine (ZYRTEC-D) 5-120 MG tablet  Daily     09/17/18 1549           Jeannie Fend, PA-C 09/17/18 1552    Linwood Dibbles, MD 09/18/18 [phone]

## 2019-01-14 ENCOUNTER — Encounter: Payer: Self-pay | Admitting: *Deleted

## 2019-01-15 ENCOUNTER — Encounter: Payer: Self-pay | Admitting: Women's Health

## 2019-01-17 ENCOUNTER — Encounter: Payer: Self-pay | Admitting: *Deleted

## 2019-01-18 ENCOUNTER — Encounter: Payer: Self-pay | Admitting: Women's Health

## 2019-01-31 ENCOUNTER — Encounter: Payer: Self-pay | Admitting: *Deleted

## 2019-02-01 ENCOUNTER — Other Ambulatory Visit: Payer: Self-pay

## 2019-02-01 ENCOUNTER — Ambulatory Visit (INDEPENDENT_AMBULATORY_CARE_PROVIDER_SITE_OTHER): Payer: Medicaid Other | Admitting: Women's Health

## 2019-02-01 ENCOUNTER — Encounter: Payer: Self-pay | Admitting: Women's Health

## 2019-02-01 VITALS — BP 109/68 | HR 95 | Temp 97.7°F | Ht 65.0 in | Wt 109.0 lb

## 2019-02-01 DIAGNOSIS — Z30011 Encounter for initial prescription of contraceptive pills: Secondary | ICD-10-CM

## 2019-02-01 DIAGNOSIS — Z3046 Encounter for surveillance of implantable subdermal contraceptive: Secondary | ICD-10-CM

## 2019-02-01 MED ORDER — NORGESTIMATE-ETH ESTRADIOL 0.25-35 MG-MCG PO TABS: 1.0000 | ORAL_TABLET | Freq: Every day | ORAL | 3 refills | Status: DC

## 2019-02-01 NOTE — Progress Notes (Signed)
   NEXPLANON REMOVAL Patient name: Katelyn Martinez MRN [id number]  Date of birth: [birth year] Subjective Findings:   Katelyn Martinez is a 23 y.o. G26P1001 Caucasian female being seen today for removal of a Nexplanon. Her Nexplanon was placed 04/03/18.  She desires removal because heavy/irregular bleeding. Signed copy of informed consent in chart.   Patient's last menstrual period was 01/30/2019. Last pap never, turned 22yo last year. Results were:  n/a The planned method of family planning is OCP (estrogen/progesterone). Does not smoke, no h/o HTN, DVT/PE, CVA, MI, or migraines w/ aura.  Pertinent History Reviewed:   Reviewed past medical,surgical, social, obstetrical and family history.  Reviewed problem list, medications and allergies. Objective Findings & Procedure:    Vitals:   02/01/19 1141  BP: 109/68  Pulse: 95  Temp: 97.7 F (36.5 C)  Weight: 109 lb (49.4 kg)  Height: 5\' 5"  (1.651 m)  Body mass index is 18.14 kg/m.  No results found for this or any previous visit (from the past 24 hour(s)).   Time out was performed.  Nexplanon site identified.  Area prepped in usual sterile fashon. One cc of 2% lidocaine was used to anesthetize the area at the distal end of the implant. A small stab incision was made right beside the implant on the distal portion.  The Nexplanon rod was grasped using hemostats and removed without difficulty.  There was less than 3 cc blood loss. There were no complications.  Steri-strips were applied over the small incision and a pressure bandage was applied.  The patient tolerated the procedure well. Assessment & Plan:   1) Nexplanon removal She was instructed to keep the area clean and dry, remove pressure bandage in 24 hours, and keep insertion site covered with the steri-strip for 3-5 days.   Follow-up PRN problems.  Rx sprintec (pt preference for cheapest coc if lost insurance), condoms x 2wks  No orders of the defined types were placed in this  encounter.   Follow-up: Return for needs pap whenever we start doing them, f/u birth control in .  Cheral Marker CNM, [facility] 02/01/2019 12:34 PM

## 2019-02-01 NOTE — Patient Instructions (Signed)
Keep the area clean and dry.  You can remove the big bandage in 24 hours, and the small steri-strip bandage in 3-5 days.  A back up method, such as condoms, should be used for two weeks.    Oral Contraception Use Oral contraceptive pills (OCPs) are medicines that you take to prevent pregnancy. OCPs work by:  Preventing the ovaries from releasing eggs.  Thickening mucus in the lower part of the uterus (cervix), which prevents sperm from entering the uterus.  Thinning the lining of the uterus (endometrium), which prevents a fertilized egg from attaching to the endometrium. OCPs are highly effective when taken exactly as prescribed. However, OCPs do not prevent sexually transmitted infections (STIs). Safe sex practices, such as using condoms while on an OCP, can help prevent STIs. Before taking OCPs, you may have a physical exam, blood test, and Pap test. A Pap test involves taking a sample of cells from your cervix to check for cancer. Discuss with your health care provider the possible side effects of the OCP you may be prescribed. When you start an OCP, be aware that it can take 2-3 months for your body to adjust to changes in hormone levels. How to take oral contraceptive pills Follow instructions from your health care provider about how to start taking your first cycle of OCPs. Your health care provider may recommend that you:  Start the pill on day 1 of your menstrual period. If you start at this time, you will not need any backup form of birth control (contraception), such as condoms.  Start the pill on the first Sunday after your menstrual period or on the day you get your prescription. In these cases, you will need to use backup contraception for the first week.  Start the pill at any time of your cycle. ? If you take the pill within 5 days of the start of your period, you will not need a backup form of contraception. ? If you start at any other time of your menstrual cycle, you will need  to use another form of contraception for 7 days. If your OCP is the type called a minipill, it will protect you from pregnancy after taking it for 2 days (48 hours), and you can stop using backup contraception after that time. After you have started taking OCPs:  If you forget to take 1 pill, take it as soon as you remember. Take the next pill at the regular time.  If you miss 2 or more pills, call your health care provider. Different pills have different instructions for missed doses. Use backup birth control until your next menstrual period starts.  If you use a 28-day pack that contains inactive pills and you miss 1 of the last 7 pills (pills with no hormones), throw away the rest of the non-hormone pills and start a new pill pack. No matter which day you start the OCP, you will always start a new pack on that same day of the week. Have an extra pack of OCPs and a backup contraceptive method available in case you miss some pills or lose your OCP pack. Follow these instructions at home:  Do not use any products that contain nicotine or tobacco, such as cigarettes and e-cigarettes. If you need help quitting, ask your health care provider.  Always use a condom to protect against STIs. OCPs do not protect against STIs.  Use a calendar to mark the days of your menstrual period.  Read the information and directions  that came with your OCP. Talk to your health care provider if you have questions. Contact a health care provider if:  You develop nausea and vomiting.  You have abnormal vaginal discharge or bleeding.  You develop a rash.  You miss your menstrual period. Depending on the type of OCP you are taking, this may be a sign of pregnancy. Ask your health care provider for more information.  You are losing your hair.  You need treatment for mood swings or depression.  You get dizzy when taking the OCP.  You develop acne after taking the OCP.  You become pregnant or think you may be  pregnant.  You have diarrhea, constipation, and abdominal pain or cramps.  You miss 2 or more pills. Get help right away if:  You develop chest pain.  You develop shortness of breath.  You have an uncontrolled or severe headache.  You develop numbness or slurred speech.  You develop visual or speech problems.  You develop pain, redness, and swelling in your legs.  You develop weakness or numbness in your arms or legs. Summary  Oral contraceptive pills (OCPs) are medicines that you take to prevent pregnancy.  OCPs do not prevent sexually transmitted infections (STIs). Always use a condom to protect against STIs.  When you start an OCP, be aware that it can take 2-3 months for your body to adjust to changes in hormone levels.  Read all the information and directions that come with your OCP. This information is not intended to replace advice given to you by your health care provider. Make sure you discuss any questions you have with your health care provider. Document Released: 08/25/2011 Document Revised: 10/17/2016 Document Reviewed: 10/17/2016 Elsevier Interactive Patient Education  2019 [employer].

## 2019-04-03 ENCOUNTER — Other Ambulatory Visit: Payer: Medicaid Other | Admitting: Adult Health

## 2019-06-03 ENCOUNTER — Telehealth: Payer: Self-pay | Admitting: *Deleted

## 2019-06-03 ENCOUNTER — Other Ambulatory Visit: Payer: Self-pay | Admitting: Women's Health

## 2019-06-03 MED ORDER — METRONIDAZOLE 0.75 % VA GEL: 1.0000 | Freq: Every day | VAGINAL | 0 refills | Status: DC

## 2019-06-03 NOTE — Telephone Encounter (Signed)
Patient states she was seen in the ED and was treated for BV.  Took all of her medication but states it did not work.  She does not currently have insurance and wants to see if different medication can be sent in.

## 2019-06-03 NOTE — Telephone Encounter (Addendum)
Pt went to [facility] and was treated for BV. Symptoms have come back. Pt was put on Flagyl pills. Can you order the gel? Pt don't have insurance and can't afford to come in right now. Thanks!! Anderson

## 2019-08-14 ENCOUNTER — Emergency Department (HOSPITAL_COMMUNITY)
Admission: EM | Admit: 2019-08-14 | Discharge: 2019-08-14 | Disposition: A | Discharge status: Home / Self Care | Payer: Medicaid Other | Attending: Emergency Medicine | Admitting: Emergency Medicine

## 2019-08-14 ENCOUNTER — Other Ambulatory Visit: Payer: Self-pay

## 2019-08-14 ENCOUNTER — Encounter (HOSPITAL_COMMUNITY): Payer: Self-pay | Admitting: Emergency Medicine

## 2019-08-14 DIAGNOSIS — R0981 Nasal congestion: Secondary | ICD-10-CM | POA: Diagnosis not present

## 2019-08-14 DIAGNOSIS — R300 Dysuria: Secondary | ICD-10-CM | POA: Insufficient documentation

## 2019-08-14 DIAGNOSIS — U071 COVID-19: Secondary | ICD-10-CM | POA: Diagnosis not present

## 2019-08-14 DIAGNOSIS — M791 Myalgia, unspecified site: Secondary | ICD-10-CM | POA: Diagnosis not present

## 2019-08-14 DIAGNOSIS — N898 Other specified noninflammatory disorders of vagina: Secondary | ICD-10-CM | POA: Diagnosis not present

## 2019-08-14 DIAGNOSIS — M7918 Myalgia, other site: Secondary | ICD-10-CM | POA: Diagnosis present

## 2019-08-14 DIAGNOSIS — Z20828 Contact with and (suspected) exposure to other viral communicable diseases: Secondary | ICD-10-CM | POA: Diagnosis not present

## 2019-08-14 DIAGNOSIS — Z7722 Contact with and (suspected) exposure to environmental tobacco smoke (acute) (chronic): Secondary | ICD-10-CM | POA: Insufficient documentation

## 2019-08-14 DIAGNOSIS — Z79899 Other long term (current) drug therapy: Secondary | ICD-10-CM | POA: Insufficient documentation

## 2019-08-14 DIAGNOSIS — R52 Pain, unspecified: Secondary | ICD-10-CM | POA: Diagnosis not present

## 2019-08-14 LAB — URINALYSIS, ROUTINE W REFLEX MICROSCOPIC
Bilirubin Urine: NEGATIVE
Glucose, UA: NEGATIVE mg/dL
Hgb urine dipstick: NEGATIVE
Ketones, ur: NEGATIVE mg/dL
Leukocytes,Ua: NEGATIVE
Nitrite: NEGATIVE
Protein, ur: NEGATIVE mg/dL
Specific Gravity, Urine: 1.024 (ref 1.005–1.030)
pH: 6 (ref 5.0–8.0)

## 2019-08-14 LAB — WET PREP, GENITAL
Clue Cells Wet Prep HPF POC: NONE SEEN
Sperm: NONE SEEN
Trich, Wet Prep: NONE SEEN
Yeast Wet Prep HPF POC: NONE SEEN

## 2019-08-14 LAB — INFLUENZA PANEL BY PCR (TYPE A & B)
Influenza A By PCR: NEGATIVE
Influenza B By PCR: NEGATIVE

## 2019-08-14 LAB — PREGNANCY, URINE: Preg Test, Ur: NEGATIVE

## 2019-08-14 MED ORDER — CEFTRIAXONE SODIUM 250 MG IJ SOLR
250.0000 mg | Freq: Once | INTRAMUSCULAR | Status: AC
  Administered 2019-08-14: 14:00:00 250 mg via INTRAMUSCULAR
  Filled 2019-08-14: qty 250

## 2019-08-14 MED ORDER — FLUTICASONE PROPIONATE 50 MCG/ACT NA SUSP: 1.0000 | Freq: Every day | NASAL | 0 refills | Status: DC | PRN

## 2019-08-14 MED ORDER — LIDOCAINE HCL (PF) 1 % IJ SOLN
INTRAMUSCULAR | Status: AC
  Administered 2019-08-14: 14:00:00 0.9 mL
  Filled 2019-08-14: qty 5

## 2019-08-14 MED ORDER — NAPROXEN 500 MG PO TABS: 500.0000 mg | ORAL_TABLET | Freq: Two times a day (BID) | ORAL | 0 refills | Status: DC

## 2019-08-14 MED ORDER — AZITHROMYCIN 250 MG PO TABS
1000.0000 mg | ORAL_TABLET | Freq: Once | ORAL | Status: AC
  Administered 2019-08-14: 1000 mg via ORAL
  Filled 2019-08-14: qty 4

## 2019-08-14 NOTE — ED Triage Notes (Signed)
Body aches and headache x 2 days. No emesis, no fever.

## 2019-08-14 NOTE — Discharge Instructions (Signed)
You were seen in the emergency department today for body aches. Your urine was normal.  Your pregnancy test was negative.  Your wet prep did not show any specific infection.  Your flu testing was negative.  We are treating you for STDs in the ER including chlamydia and gonorrhea, we will call you if these results are positive within the next 72 hours, if positive you will need to inform all sexual partners.  Please do not dissipate intercourse of any kind for 7 days to prevent reinfection.  Please use protection when sexually active.  We tested for COVID-19, we will call you if results are positive, please quarantine in the meantime and follow attached instructions if positive.  We are sending you with naproxen to help with pain.  Naproxen is a nonsteroidal anti-inflammatory medication that will help with pain and swelling. Be sure to take this medication as prescribed with food, 1 pill every 12 hours,  It should be taken with food, as it can cause stomach upset, and more seriously, stomach bleeding. Do not take other nonsteroidal anti-inflammatory medications with this such as Advil, Motrin, Aleve, Mobic, Goodie Powder, or Motrin.    You make take Tylenol per over the counter dosing with these medications.   We are also sending you with Flonase take 1 spray per nostril daily to help with congestion.  We have prescribed you new medication(s) today. Discuss the medications prescribed today with your pharmacist as they can have adverse effects and interactions with your other medicines including over the counter and prescribed medications. Seek medical evaluation if you start to experience new or abnormal symptoms after taking one of these medicines, seek care immediately if you start to experience difficulty breathing, feeling of your throat closing, facial swelling, or rash as these could be indications of a more serious allergic reaction  Please follow-up with primary care and/or OB/GYN within the  next 1 week for reevaluation.  Return to the ER for new or worsening symptoms or any other concerns.

## 2019-08-14 NOTE — ED Provider Notes (Signed)
[facility] EMERGENCY DEPARTMENT Provider Note   CSN: [id number] Arrival date & time: 08/14/19  1217     History   Chief Complaint Chief Complaint  Patient presents with   Generalized Body Aches    HPI Katelyn Martinez is a 23 y.o. female with a hx of asthma, anxiety, & headaches secondary to prior head trauma who presents to the ED with complaints of body aches for the past 2 days. Patient states she has had body aches with associated nasal congestion, ear pain, & mild headaches (gradual onset, similar to prior). She states she has not been sleeping well which may be contributing to these sxs. Also on further discussion reports dysuria & vaginal discharge for "awhile" now. She thinks this is related to BV as she had it before and thinks it came back. She has been sexually active with two female partners in the past few months, no protection, no known exposure to STDs. Denies fever, chills, N/V/D, chest pain, cough, dyspnea, or acute abdominal pain. No known covid exposures.     HPI  Past Medical History:  Diagnosis Date   Asthma    Headaches due to old head trauma    Nexplanon insertion 04/03/2018   Inserted right arm 04/03/18   Recurrent upper respiratory infection (URI)    Trauma 12/22/2014   feel at [facility] and had skull Fracture     Patient Active Problem List   Diagnosis Date Noted   History of gestational diabetes 04/24/2017   Susceptible to varicella (non-immune), currently pregnant 12/19/2016   Tibial plateau fracture, right 12/24/2014   Fracture of right fibula 12/24/2014   Back abrasion 12/24/2014   Lumbar transverse process fracture ([place]) 12/24/2014   Asthma 12/24/2014   Anxiety disorder 12/24/2014   Intracranial hemorrhage (HCC)    Pneumocephalus, traumatic    Pulmonary contusion    Temporal bone fracture ([place])    Trauma    Basilar skull fracture ([place]) 12/23/2014   Fall by pediatric patient     Past Surgical History:  Procedure  Laterality Date   BLADDER SURGERY     uteral reimplantation   ORIF TIBIA PLATEAU Right 12/29/2014   Procedure: OPEN REDUCTION INTERNAL FIXATION (ORIF) TIBIAL PLATEAU;  Surgeon: Renette Butters, MD;  Location: [place];  Service: Orthopedics;  Laterality: Right;   SALIVARY GLAND SURGERY     TONSILLECTOMY       OB History    Gravida  1   Para  1   Term  1   Preterm      AB      Living  1     SAB      TAB      Ectopic      Multiple  0   Live Births  1            Home Medications    Prior to Admission medications   Medication Sig Start Date End Date Taking? Authorizing Provider  cetirizine-pseudoephedrine (ZYRTEC-D) 5-120 MG tablet Take 1 tablet by mouth daily. 09/17/18   Tacy Learn, PA-C  fluticasone (FLONASE) 50 MCG/ACT nasal spray Place 1 spray into both nostrils daily. Patient not taking: Reported on 02/01/2019 09/17/18   Suella Broad A, PA-C  metroNIDAZOLE (METROGEL VAGINAL) 0.75 % vaginal gel Place 1 Applicatorful vaginally at bedtime. X 5 nights, no alcohol or sex while using 06/03/19   Roma Schanz, CNM  norgestimate-ethinyl estradiol (ORTHO-CYCLEN) 0.25-35 MG-MCG tablet Take 1 tablet by mouth daily. 02/01/19  Cheral MarkerBooker, Kimberly R, CNM    Family History Family History  Problem Relation Age of Onset   Ovarian cysts Mother    Endometriosis Mother    Fibromyalgia Mother    Hepatitis C Mother    Diabetes Mother    Cancer Maternal Aunt        ovarian   Cervical cancer Maternal Aunt    Cancer Maternal Grandfather        bladder   Cancer Paternal Grandmother        lung   Bipolar disorder Father    Suicidality Paternal Grandfather    Cancer Maternal Grandmother    Other Sister        mood disorder; patella femoral syndrome    Social History Social History   Tobacco Use   Smoking status: Passive Smoke Exposure - Never Smoker   Smokeless tobacco: Never Used  Substance Use Topics   Alcohol use: Yes   Drug use: Not  Currently    Types: Marijuana    Comment: denies any at this time-hx reports marijuana     Allergies   Patient has no known allergies.   Review of Systems Review of Systems  Constitutional: Negative for chills and fever.  HENT: Positive for congestion and ear pain. Negative for sore throat.   Respiratory: Negative for cough and shortness of breath.   Cardiovascular: Negative for chest pain.  Gastrointestinal: Negative for abdominal pain, diarrhea, nausea and vomiting.  Genitourinary: Positive for dysuria and vaginal discharge. Negative for vaginal bleeding.  Musculoskeletal: Positive for myalgias (generalized).  All other systems reviewed and are negative.    Physical Exam Updated Vital Signs BP 113/76 (BP Location: Right Arm)    Pulse 88    Temp 98.2 F (36.8 C) (Oral)    Resp 17    Ht 5\' 5"  (1.651 m)    Wt 48.5 kg    LMP 07/31/2019    SpO2 100%    BMI 17.81 kg/m   Physical Exam Vitals signs and nursing note reviewed. Exam conducted with a chaperone present.  Constitutional:      General: She is not in acute distress.    Appearance: She is well-developed.  HENT:     Head: Normocephalic and atraumatic.     Right Ear: Ear canal normal. Tympanic membrane is not perforated, erythematous, retracted or bulging.     Left Ear: Ear canal normal. Tympanic membrane is not perforated, erythematous, retracted or bulging.     Ears:     Comments: No mastoid erythema/swelling/tenderness.     Nose:     Right Sinus: No maxillary sinus tenderness or frontal sinus tenderness.     Left Sinus: No maxillary sinus tenderness or frontal sinus tenderness.     Mouth/Throat:     Pharynx: Uvula midline. No oropharyngeal exudate or posterior oropharyngeal erythema.     Comments: Posterior oropharynx is symmetric appearing. Patient tolerating own secretions without difficulty. No trismus. No drooling. No hot potato voice. No swelling beneath the tongue, submandibular compartment is soft.  Eyes:      General:        Right eye: No discharge.        Left eye: No discharge.     Conjunctiva/sclera: Conjunctivae normal.     Pupils: Pupils are equal, round, and reactive to light.  Neck:     Musculoskeletal: Normal range of motion and neck supple. No edema or neck rigidity.  Cardiovascular:     Rate and Rhythm: Normal rate and  regular rhythm.     Heart sounds: No murmur.  Pulmonary:     Effort: Pulmonary effort is normal. No respiratory distress.     Breath sounds: Normal breath sounds. No wheezing, rhonchi or rales.  Abdominal:     General: There is no distension.     Palpations: Abdomen is soft.     Tenderness: There is no abdominal tenderness. There is no right CVA tenderness, left CVA tenderness, guarding or rebound.  Genitourinary:    Labia:        Right: No lesion.        Left: No lesion.      Vagina: Vaginal discharge present.     Cervix: No cervical motion tenderness or friability.     Adnexa:        Right: No mass, tenderness or fullness.         Left: No mass, tenderness or fullness.    Lymphadenopathy:     Cervical: No cervical adenopathy.  Skin:    General: Skin is warm and dry.     Findings: No rash.  Neurological:     Mental Status: She is alert.  Psychiatric:        Behavior: Behavior normal.    ED Treatments / Results  Labs (all labs ordered are listed, but only abnormal results are displayed) Labs Reviewed  WET PREP, GENITAL - Abnormal; Notable for the following components:      Result Value   WBC, Wet Prep HPF POC MODERATE (*)    All other components within normal limits  URINALYSIS, ROUTINE W REFLEX MICROSCOPIC - Abnormal; Notable for the following components:   APPearance CLOUDY (*)    All other components within normal limits  NOVEL CORONAVIRUS, NAA (HOSP ORDER, SEND-OUT TO REF LAB; TAT 18-24 HRS)  PREGNANCY, URINE  INFLUENZA PANEL BY PCR (TYPE A & B)  GC/CHLAMYDIA PROBE AMP ([place]) NOT AT [facility]    EKG None  Radiology No results  found.  Procedures Procedures (including critical care time)  Medications Ordered in ED Medications - No data to display   Initial Impression / Assessment and Plan / ED Course  I have reviewed the triage vital signs and the nursing notes.  Pertinent labs & imaging results that were available during my care of the patient were reviewed by me and considered in my medical decision making (see chart for details).   Patient presents to the emergency department with complaints of body aches with nasal congestion and ear pain for the past 2 days, also complains of dysuria and vaginal discharge for "a while now."  Nontoxic-appearing, no apparent distress, vitals WNL.  Exam not consistent with AOM/AOE/mastoiditis.  No meningismus.  Oropharynx is clear.  No sinus tenderness, afebrile, doubt acute bacterial sinusitis.  Lungs are clear to auscultation bilaterally, no respiratory complaints, do not suspect pneumonia or asthma exacerbation.  Influenza testing negative.  Likely viral syndrome, may be related to her poor sleep habits recently, Covid test pending, supportive care. Abdomen is nontender without peritoneal signs.  Urinalysis without UTI.  Pregnancy test negative.  Wet prep without BV, yeast, or trichomoniasis.  No abdominal, adnexal, or cervical motion tenderness to indicate PID.  GC/chlamydia pending, offered prophylaxis which patient is in agreement with.  We discussed the need to inform sexual partners of positive any importance of protection.  PCP/OB/GYN follow-up. I discussed results, treatment plan, need for follow-up, and return precautions with the patient. Provided opportunity for questions, patient confirmed understanding and is in  agreement with plan.    Katelyn Martinez was evaluated in Emergency Department on 08/14/2019 for the symptoms described in the history of present illness. He/she was evaluated in the context of the global COVID-19 pandemic, which necessitated consideration that  the patient might be at risk for infection with the SARS-CoV-2 virus that causes COVID-19. Institutional protocols and algorithms that pertain to the evaluation of patients at risk for COVID-19 are in a state of rapid change based on information released by regulatory bodies including the CDC and federal and state organizations. These policies and algorithms were followed during the patient's care in the ED.  Final Clinical Impressions(s) / ED Diagnoses   Final diagnoses:  Body aches  Vaginal discharge    ED Discharge Orders         Ordered    fluticasone (FLONASE) 50 MCG/ACT nasal spray  Daily PRN     08/14/19 1427    naproxen (NAPROSYN) 500 MG tablet  2 times daily     08/14/19 [address] [place], PA-C 08/14/19 1435    Geoffery Lyons, MD 08/14/19 1454

## 2019-08-15 LAB — NOVEL CORONAVIRUS, NAA (HOSP ORDER, SEND-OUT TO REF LAB; TAT 18-24 HRS): SARS-CoV-2, NAA: DETECTED — AB

## 2019-08-16 LAB — GC/CHLAMYDIA PROBE AMP (~~LOC~~) NOT AT ARMC
Chlamydia: NEGATIVE
Neisseria Gonorrhea: NEGATIVE

## 2019-08-19 ENCOUNTER — Encounter: Payer: Self-pay | Admitting: Obstetrics and Gynecology

## 2019-08-19 DIAGNOSIS — U071 COVID-19: Secondary | ICD-10-CM | POA: Insufficient documentation

## 2019-09-04 ENCOUNTER — Emergency Department (HOSPITAL_COMMUNITY)
Admission: EM | Admit: 2019-09-04 | Discharge: 2019-09-04 | Disposition: A | Discharge status: Home / Self Care | Payer: Medicaid Other | Attending: Emergency Medicine | Admitting: Emergency Medicine

## 2019-09-04 ENCOUNTER — Encounter (HOSPITAL_COMMUNITY): Payer: Self-pay | Admitting: *Deleted

## 2019-09-04 ENCOUNTER — Other Ambulatory Visit: Payer: Self-pay

## 2019-09-04 DIAGNOSIS — Z79899 Other long term (current) drug therapy: Secondary | ICD-10-CM | POA: Insufficient documentation

## 2019-09-04 DIAGNOSIS — Z793 Long term (current) use of hormonal contraceptives: Secondary | ICD-10-CM | POA: Diagnosis not present

## 2019-09-04 DIAGNOSIS — Z7722 Contact with and (suspected) exposure to environmental tobacco smoke (acute) (chronic): Secondary | ICD-10-CM | POA: Insufficient documentation

## 2019-09-04 DIAGNOSIS — J45909 Unspecified asthma, uncomplicated: Secondary | ICD-10-CM | POA: Diagnosis not present

## 2019-09-04 DIAGNOSIS — R300 Dysuria: Secondary | ICD-10-CM | POA: Insufficient documentation

## 2019-09-04 DIAGNOSIS — R1030 Lower abdominal pain, unspecified: Secondary | ICD-10-CM | POA: Diagnosis present

## 2019-09-04 LAB — COMPREHENSIVE METABOLIC PANEL
ALT: 14 U/L (ref 0–44)
AST: 20 U/L (ref 15–41)
Albumin: 3.9 g/dL (ref 3.5–5.0)
Alkaline Phosphatase: 58 U/L (ref 38–126)
Anion gap: 7 (ref 5–15)
BUN: 13 mg/dL (ref 6–20)
CO2: 29 mmol/L (ref 22–32)
Calcium: 8.9 mg/dL (ref 8.9–10.3)
Chloride: 104 mmol/L (ref 98–111)
Creatinine, Ser: 0.93 mg/dL (ref 0.44–1.00)
GFR calc Af Amer: >60 mL/min (ref >60)
GFR calc non Af Amer: >60 mL/min (ref >60)
Glucose, Bld: 80 mg/dL (ref 70–99)
Potassium: 4 mmol/L (ref 3.5–5.1)
Sodium: 140 mmol/L (ref 135–145)
Total Bilirubin: 1 mg/dL (ref 0.3–1.2)
Total Protein: 6.7 g/dL (ref 6.5–8.1)

## 2019-09-04 LAB — WET PREP, GENITAL
Clue Cells Wet Prep HPF POC: NONE SEEN
Sperm: NONE SEEN
Trich, Wet Prep: NONE SEEN
Yeast Wet Prep HPF POC: NONE SEEN

## 2019-09-04 LAB — CBC
HCT: 38.8 % (ref 36.0–46.0)
Hemoglobin: 12.3 g/dL (ref 12.0–15.0)
MCH: 28.5 pg (ref 26.0–34.0)
MCHC: 31.7 g/dL (ref 30.0–36.0)
MCV: 89.8 fL (ref 80.0–100.0)
Platelets: 293 10*3/uL (ref 150–400)
RBC: 4.32 MIL/uL (ref 3.87–5.11)
RDW: 13.2 % (ref 11.5–15.5)
WBC: 9.7 10*3/uL (ref 4.0–10.5)
nRBC: 0 % (ref 0.0–0.2)

## 2019-09-04 LAB — URINALYSIS, ROUTINE W REFLEX MICROSCOPIC
Bacteria, UA: NONE SEEN
Bilirubin Urine: NEGATIVE
Glucose, UA: NEGATIVE mg/dL
Hgb urine dipstick: NEGATIVE
Ketones, ur: NEGATIVE mg/dL
Nitrite: NEGATIVE
Protein, ur: NEGATIVE mg/dL
Specific Gravity, Urine: 1.024 (ref 1.005–1.030)
pH: 6 (ref 5.0–8.0)

## 2019-09-04 LAB — PREGNANCY, URINE: Preg Test, Ur: NEGATIVE

## 2019-09-04 LAB — LIPASE, BLOOD: Lipase: 24 U/L (ref 11–51)

## 2019-09-04 LAB — HIV ANTIBODY (ROUTINE TESTING W REFLEX): HIV Screen 4th Generation wRfx: NONREACTIVE

## 2019-09-04 MED ORDER — SODIUM CHLORIDE 0.9% FLUSH: 3.0000 mL | Freq: Once | INTRAVENOUS | Status: DC

## 2019-09-04 MED ORDER — CEPHALEXIN 500 MG PO CAPS: 500.0000 mg | ORAL_CAPSULE | Freq: Two times a day (BID) | ORAL | 0 refills | Status: DC

## 2019-09-04 MED ORDER — LIDOCAINE HCL (PF) 1 % IJ SOLN
INTRAMUSCULAR | Status: AC
  Filled 2019-09-04: qty 2

## 2019-09-04 MED ORDER — CEFTRIAXONE SODIUM 250 MG IJ SOLR
250.0000 mg | Freq: Once | INTRAMUSCULAR | Status: AC
  Administered 2019-09-04: 18:00:00 250 mg via INTRAMUSCULAR
  Filled 2019-09-04: qty 250

## 2019-09-04 MED ORDER — AZITHROMYCIN 250 MG PO TABS
1000.0000 mg | ORAL_TABLET | Freq: Once | ORAL | Status: AC
  Administered 2019-09-04: 1000 mg via ORAL
  Filled 2019-09-04: qty 4

## 2019-09-04 NOTE — ED Provider Notes (Signed)
[facility] EMERGENCY DEPARTMENT Provider Note   CSN: [id number] Arrival date & time: 09/04/19  1400     History Chief Complaint  Patient presents with  . Abdominal Pain    Katelyn Martinez is a 23 y.o. female with a history of asthma and anxiety who presents to the emergency department with complaints of lower abdominal discomfort & urinary sxs over the past few days. Patient reports suprapubic pressure, worse with urination, no other alleviating/aggravating factors associated with dysuria & frequency. Sxs feel similar to prior UTI. Recently has had frequent intercourse. Has her typical vaginal discharge, nothing out of the normal. Denies fever, chills, emesis, vaginal bleeding, vaginal pruritus, genital lesions, or diarrhea.   HPI     Past Medical History:  Diagnosis Date  . Asthma   . 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 Nexplanon insertion 04/03/2018   Inserted right arm 04/03/18  . Recurrent upper respiratory infection (URI)   . Trauma 12/22/2014   feel at [facility] and had skull Fracture     Patient Active Problem List   Diagnosis Date Noted  . COVID-19 virus detected 11/25 08/19/2019  . Tibial plateau fracture, right 12/24/2014  . Fracture of right fibula 12/24/2014  . Back abrasion 12/24/2014  . Lumbar transverse process fracture ([place]) 12/24/2014  . Asthma 12/24/2014  . Anxiety disorder 12/24/2014  . Intracranial hemorrhage ([place])   . Pneumocephalus, traumatic   . Pulmonary contusion   . Temporal bone fracture ([place])   . Trauma   . Basilar skull fracture ([place]) 12/23/2014  . Fall by pediatric patient     Past Surgical History:  Procedure Laterality Date  . BLADDER SURGERY     uteral reimplantation  . ORIF TIBIA PLATEAU Right 12/29/2014   Procedure: OPEN REDUCTION INTERNAL FIXATION (ORIF) TIBIAL PLATEAU;  Surgeon: Renette Butters, MD;  Location: [place];  Service: Orthopedics;  Laterality: Right;  . SALIVARY GLAND SURGERY    . TONSILLECTOMY       OB  History    Gravida  1   Para  1   Term  1   Preterm      AB      Living  1     SAB      TAB      Ectopic      Multiple  0   Live Births  1           Family History  Problem Relation Age of Onset  . Ovarian cysts Mother   . Endometriosis Mother   . Fibromyalgia Mother   . Hepatitis C Mother   . Diabetes Mother   . Cancer Maternal Aunt        ovarian  . Cervical cancer Maternal Aunt   . Cancer Maternal Grandfather        bladder  . Cancer Paternal Grandmother        lung  . Bipolar disorder Father   . Suicidality Paternal Grandfather   . Cancer Maternal Grandmother   . Other Sister        mood disorder; patella femoral syndrome    Social History   Tobacco Use  . Smoking status: Passive Smoke Exposure - Never Smoker  . Smokeless tobacco: Never Used  Substance Use Topics  . Alcohol use: Yes  . Drug use: Not Currently    Types: Marijuana    Comment: denies any at this time-hx reports marijuana    Home Medications Prior to Admission  medications   Medication Sig Start Date End Date Taking? Authorizing Provider  cetirizine-pseudoephedrine (ZYRTEC-D) 5-120 MG tablet Take 1 tablet by mouth daily. 09/17/18   Jeannie Fend, PA-C  fluticasone (FLONASE) 50 MCG/ACT nasal spray Place 1 spray into both nostrils daily as needed for rhinitis. 08/14/19   Caili Escalera R, PA-C  metroNIDAZOLE (METROGEL VAGINAL) 0.75 % vaginal gel Place 1 Applicatorful vaginally at bedtime. X 5 nights, no alcohol or sex while using 06/03/19   Cheral Marker, CNM  naproxen (NAPROSYN) 500 MG tablet Take 1 tablet (500 mg total) by mouth 2 (two) times daily. 08/14/19   Miosotis Wetsel R, PA-C  norgestimate-ethinyl estradiol (ORTHO-CYCLEN) 0.25-35 MG-MCG tablet Take 1 tablet by mouth daily. 02/01/19   Cheral Marker, CNM    Allergies    Patient has no known allergies.  Review of Systems   Review of Systems  Constitutional: Negative for chills and fever.    Respiratory: Negative for shortness of breath.   Cardiovascular: Negative for chest pain.  Gastrointestinal: Positive for abdominal pain. Negative for blood in stool, constipation, diarrhea and vomiting.  Genitourinary: Positive for dysuria, frequency and vaginal discharge (baseline). Negative for vaginal bleeding.  All other systems reviewed and are negative.   Physical Exam Updated Vital Signs BP 106/64 (BP Location: Right Arm)   Pulse 93   Temp 97.6 F (36.4 C) (Oral)   Resp 20   Ht 5\' 5"  (1.651 m)   Wt 52.2 kg   LMP 08/21/2019   SpO2 100%   BMI 19.14 kg/m   Physical Exam Vitals and nursing note reviewed. Exam conducted with a chaperone present.  Constitutional:      General: She is not in acute distress.    Appearance: She is well-developed. She is not toxic-appearing.  HENT:     Head: Normocephalic and atraumatic.  Eyes:     General:        Right eye: No discharge.        Left eye: No discharge.     Conjunctiva/sclera: Conjunctivae normal.  Cardiovascular:     Rate and Rhythm: Normal rate and regular rhythm.  Pulmonary:     Effort: Pulmonary effort is normal. No respiratory distress.     Breath sounds: Normal breath sounds. No wheezing, rhonchi or rales.  Abdominal:     General: There is no distension.     Palpations: Abdomen is soft.     Tenderness: There is abdominal tenderness in the suprapubic area. There is no guarding or rebound.  Genitourinary:    Labia:        Right: No lesion.        Left: No lesion.      Vagina: Vaginal discharge (very minimal, thin/white) present.     Cervix: No cervical motion tenderness or friability.     Adnexa:        Right: No mass, tenderness or fullness.         Left: No mass, tenderness or fullness.    Musculoskeletal:     Cervical back: Neck supple.  Skin:    General: Skin is warm and dry.     Findings: No rash.  Neurological:     Mental Status: She is alert.     Comments: Clear speech.   Psychiatric:         Behavior: Behavior normal.     ED Results / Procedures / Treatments   Labs (all labs ordered are listed, but only abnormal results are displayed) Labs  Reviewed  LIPASE, BLOOD  COMPREHENSIVE METABOLIC PANEL  CBC  URINALYSIS, ROUTINE W REFLEX MICROSCOPIC  POC URINE PREG, ED    EKG None  Radiology No results found.  Procedures Procedures (including critical care time)  Medications Ordered in ED Medications  sodium chloride flush (NS) 0.9 % injection 3 mL (has no administration in time range)  cefTRIAXone (ROCEPHIN) injection 250 mg (has no administration in time range)  azithromycin (ZITHROMAX) tablet 1,000 mg (has no administration in time range)    ED Course  I have reviewed the triage vital signs and the nursing notes.  Pertinent labs & imaging results that were available during my care of the patient were reviewed by me and considered in my medical decision making (see chart for details).    MDM Rules/Calculators/A&P                      Patient presents to the emergency department with complaints of suprapbuic pressure and urinary symptoms over the past few days.  Patient is nontoxic-appearing, resting comfortably, vitals WNL.  Exam with mild suprapubic abdominal tenderness without peritoneal signs.  Speculum exam with minimal white vaginal discharge, no significant cervical motion tenderness or adnexal tenderness with bimanual.  GU exam is not consistent with PID.  No peritoneal signs on abdominal exam, do not suspect appendicitis, obstruction, perforation, pancreatitis, or cholecystitis.  Her work-up per triage has been reviewed: No leukocytosis, anemia, electrolyte derangement, renal dysfunction, or abnormalities in her LFTs/lipase.  Urinalysis with trace leukocytes and 21-50 WBCs-we will send for culture, given patient states this feels exactly the same as prior UTIs feel it is reasonable to treat. Preg test negative- doubt ectopic. Wet prep without BV/trich/yeast. GC,  chlamydia, HIV, and syphilis pending, she has elected prophylaxis for GC/chlamydia.  Again her exam does not seem consistent with PID.  Discussed importance of protection when being sexually active.  Discussed abstinence for 1 week to event possible reinfection with sexually transmitted infections.  OB/GYN follow-up. I discussed results, treatment plan, need for follow-up, and return precautions with the patient. Provided opportunity for questions, patient confirmed understanding and is in agreement with plan.   Final Clinical Impression(s) / ED Diagnoses Final diagnoses:  Dysuria    Rx / DC Orders ED Discharge Orders         Ordered    cephALEXin (KEFLEX) 500 MG capsule  2 times daily     09/04/19 [address]1801           Kyheem Bathgate R, PA-C 09/04/19 1803    Bethann BerkshireZammit, Joseph, MD 09/04/19 2021

## 2019-09-04 NOTE — Discharge Instructions (Signed)
You were seen in the emergency department today for abdominal pain and urinary symptoms.  Your labs were overall reassuring.  We are treating you for a UTI with Keflex, please take this antibiotic as prescribed, we have cultured your urine and we will call you should you need a different antibiotic.  We have tested you for gonorrhea, chlamydia, HIV, syphilis, we will call you if results are positive.  We have treated you for gonorrhea and chlamydia here to cover for this.  You will need to inform all sexual partners if your STD testing is positive.  Please do not have intercourse for 1 week to avoid potential reinfection.  Please follow-up with OB/GYN within 3 to 5 days for reevaluation.  Return to the ER for new or worsening symptoms including but not limited to worsening pain, fever, inability to keep fluids down, different vaginal discharge, or any other concerns.

## 2019-09-04 NOTE — ED Triage Notes (Signed)
Pt c/o generalized abdominal pain and burning with urination; pt states she has been having the sx for the last few days

## 2019-09-05 LAB — GC/CHLAMYDIA PROBE AMP (~~LOC~~) NOT AT ARMC
Chlamydia: NEGATIVE
Neisseria Gonorrhea: NEGATIVE

## 2019-09-05 LAB — RPR: RPR Ser Ql: NONREACTIVE

## 2019-09-06 LAB — URINE CULTURE: Culture: 50000 — AB

## 2019-09-07 ENCOUNTER — Telehealth: Payer: Self-pay | Admitting: Emergency Medicine

## 2019-09-07 NOTE — Telephone Encounter (Signed)
Post ED Visit - Positive Culture Follow-up  Culture report reviewed by antimicrobial stewardship pharmacist: [place] Team []  Elenor Quinones, Pharm.D. []  Heide Guile, Pharm.D., BCPS AQ-ID []  Parks Neptune, Pharm.D., BCPS []  Alycia Rossetti, Pharm.D., BCPS []  Dixon, Pharm.D., BCPS, AAHIVP []  Legrand Como, Pharm.D., BCPS, AAHIVP []  Salome Arnt, PharmD, BCPS []  Johnnette Gourd, PharmD, BCPS []  Hughes Better, PharmD, BCPS []  Leeroy Cha, PharmD []  Laqueta Linden, PharmD, BCPS []  Albertina Parr, PharmD  [place] Team []  Leodis Sias, PharmD []  Lindell Spar, PharmD []  Royetta Asal, PharmD []  Graylin Shiver, Rph []  Rema Fendt) Glennon Mac, PharmD []  Arlyn Dunning, PharmD []  Netta Cedars, PharmD []  Dia Sitter, PharmD []  Leone Haven, PharmD []  Gretta Arab, PharmD []  Theodis Shove, PharmD []  Peggyann Juba, PharmD []  Reuel Boom, PharmD   Positive urine culture Treated with cephalexin, organism sensitive to the same and no further patient follow-up is required at this time.  Hazle Nordmann 09/07/2019, 12:16 PM

## 2019-10-17 ENCOUNTER — Ambulatory Visit (INDEPENDENT_AMBULATORY_CARE_PROVIDER_SITE_OTHER): Payer: Medicaid Other

## 2019-10-17 ENCOUNTER — Other Ambulatory Visit: Payer: Self-pay

## 2019-10-17 ENCOUNTER — Ambulatory Visit
Admission: EM | Admit: 2019-10-17 | Discharge: 2019-10-17 | Disposition: A | Discharge status: Home / Self Care | Payer: Medicaid Other | Attending: Emergency Medicine | Admitting: Emergency Medicine

## 2019-10-17 DIAGNOSIS — M25472 Effusion, left ankle: Secondary | ICD-10-CM | POA: Diagnosis not present

## 2019-10-17 DIAGNOSIS — M25572 Pain in left ankle and joints of left foot: Secondary | ICD-10-CM | POA: Diagnosis not present

## 2019-10-17 DIAGNOSIS — M7989 Other specified soft tissue disorders: Secondary | ICD-10-CM | POA: Diagnosis not present

## 2019-10-17 DIAGNOSIS — S93402A Sprain of unspecified ligament of left ankle, initial encounter: Secondary | ICD-10-CM

## 2019-10-17 DIAGNOSIS — S99912A Unspecified injury of left ankle, initial encounter: Secondary | ICD-10-CM

## 2019-10-17 DIAGNOSIS — M19072 Primary osteoarthritis, left ankle and foot: Secondary | ICD-10-CM | POA: Diagnosis not present

## 2019-10-17 MED ORDER — NAPROXEN 375 MG PO TABS: 375.0000 mg | ORAL_TABLET | Freq: Two times a day (BID) | ORAL | 0 refills | Status: DC

## 2019-10-17 NOTE — ED Provider Notes (Signed)
[facility]   [id number] 10/17/19 Arrival Time: 1549  CC: LT ankle PAIN  SUBJECTIVE: History from: patient. Katelyn Martinez is a 24 y.o. female complains of left ankle pain/ injury that began on 10/12/19, or 5 days ago.  Symptoms began after hitting ankle on a bail of hay while snow tubing.  Pain diffuse about the ankle.  Describes the pain as constant and throbbing in character.  Has NOT tried OTC medications.  Symptoms are made worse with weight-bearing.  Complains of associated swelling.  Denies fever, chills, erythema, ecchymosis, weakness, numbness and tingling.  ROS: As per HPI.  All other pertinent ROS negative.     Past Medical History:  Diagnosis Date  . Asthma   . 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 Nexplanon insertion 04/03/2018   Inserted right arm 04/03/18  . Recurrent upper respiratory infection (URI)   . Trauma 12/22/2014   feel at [facility] and had skull Fracture    Past Surgical History:  Procedure Laterality Date  . BLADDER SURGERY     uteral reimplantation  . ORIF TIBIA PLATEAU Right 12/29/2014   Procedure: OPEN REDUCTION INTERNAL FIXATION (ORIF) TIBIAL PLATEAU;  Surgeon: Sheral Apley, MD;  Location: [facility] OR;  Service: Orthopedics;  Laterality: Right;  . SALIVARY GLAND SURGERY    . TONSILLECTOMY     No Known Allergies No current facility-administered medications on file prior to encounter.   No current outpatient medications on file prior to encounter.   Social History   Socioeconomic History  . Marital status: Single    Spouse name: Not on file  . Number of children: 1  . Years of education: Not on file  . Highest education level: Not on file  Occupational History  . Not on file  Tobacco Use  . Smoking status: Passive Smoke Exposure - Never Smoker  . Smokeless tobacco: Never Used  Substance and Sexual Activity  . Alcohol use: Yes  . Drug use: Not Currently    Types: Marijuana    Comment: denies any at this time-hx reports  marijuana  . Sexual activity: Yes    Birth control/protection: None  Other Topics Concern  . Not on file  Social History Narrative  . Not on file   Social Determinants of Health   Financial Resource Strain:   . Difficulty of Paying Living Expenses: Not on file  Food Insecurity:   . Worried About Programme researcher, broadcasting/film/video in the Last Year: Not on file  . Ran Out of Food in the Last Year: Not on file  Transportation Needs:   . Lack of Transportation (Medical): Not on file  . Lack of Transportation (Non-Medical): Not on file  Physical Activity:   . Days of Exercise per Week: Not on file  . Minutes of Exercise per Session: Not on file  Stress:   . Feeling of Stress : Not on file  Social Connections:   . Frequency of Communication with Friends and Family: Not on file  . Frequency of Social Gatherings with Friends and Family: Not on file  . Attends Religious Services: Not on file  . Active Member of Clubs or Organizations: Not on file  . Attends Banker Meetings: Not on file  . Marital Status: Not on file  Intimate Partner Violence:   . Fear of Current or Ex-Partner: Not on file  . Emotionally Abused: Not on file  . Physically Abused: Not on file  . Sexually Abused: Not  on file   Family History  Problem Relation Age of Onset  . Ovarian cysts Mother   . Endometriosis Mother   . Fibromyalgia Mother   . Hepatitis C Mother   . Diabetes Mother   . Cancer Maternal Aunt        ovarian  . Cervical cancer Maternal Aunt   . Cancer Maternal Grandfather        bladder  . Cancer Paternal Grandmother        lung  . Bipolar disorder Father   . Suicidality Paternal Grandfather   . Cancer Maternal Grandmother   . Other Sister        mood disorder; patella femoral syndrome    OBJECTIVE:  Vitals:   10/17/19 1601  BP: 104/72  Pulse: 89  Resp: 18  Temp: 98.2 F (36.8 C)  SpO2: 97%    General appearance: ALERT; in no acute distress.  Head: NCAT Lungs: Normal  respiratory effort CV: Dorsalis pedis pulse 2+. Cap refill < 2 seconds Musculoskeletal: LT ankle Inspection: Mild swelling diffuse about the ankle Palpation: diffusely TTP over medial and lateral ankle ROM: FROM active and passive Strength: 5/5 dorsiflexion, 5/5 plantar flexion Skin: warm and dry Neurologic: Ambulates with antalgic gait; Sensation intact about the lower extremities Psychological: alert and cooperative; normal mood and affect  DIAGNOSTIC STUDIES:  DG Ankle Complete Left  Result Date: 10/17/2019 CLINICAL DATA:  24 year old female with a history of ankle swelling EXAM: LEFT ANKLE COMPLETE - 3+ VIEW COMPARISON:  None. FINDINGS: No acute displaced fracture. Mild nonspecific circumferential soft tissue swelling at the ankle. Ankle mortise is congruent. No radiopaque foreign body. Degenerative changes of the hindfoot. IMPRESSION: Negative for acute bony abnormality. Nonspecific circumferential soft tissue swelling. Electronically Signed   By: Corrie Mckusick D.O.   On: 10/17/2019 16:14    X-rays negative for bony abnormalities including fracture, or dislocation.  No soft tissue swelling.    I have reviewed the x-rays myself and the radiologist interpretation. I am in agreement with the radiologist interpretation.     ASSESSMENT & PLAN:  1. Acute left ankle pain   2. Injury of left ankle, initial encounter   3. Sprain of left ankle, unspecified ligament, initial encounter    Meds ordered this encounter  Medications  . naproxen (NAPROSYN) 375 MG tablet    Sig: Take 1 tablet (375 mg total) by mouth 2 (two) times daily.    Dispense:  20 tablet    Refill:  0    Order Specific Question:   Supervising Provider    Answer:   Raylene Everts [[id number]]   X-rays did not show fracture or dislocation Continue conservative management of rest, ice, elevation, and gentle stretches Crutches given in office Progress activities as tolerated (remain non-weightbearing with crutches,  weight-bearing with crutches, wean off one crutch, to walking without crutches) Take naproxen as needed for pain relief (may cause abdominal discomfort, ulcers, and GI bleeds avoid taking with other NSAIDs) Follow up with occupation health for further evaluation and specific work restrictions Return or go to the ER if you have any new or worsening symptoms (fever, chills, chest pain, increased swelling, redness, pain, bruising, etc...)   Reviewed expectations re: course of current medical issues. Questions answered. Outlined signs and symptoms indicating need for more acute intervention. Patient verbalized understanding. After Visit Summary given.    Lestine Box, PA-C 10/17/19 1638

## 2019-10-17 NOTE — ED Triage Notes (Signed)
Pt injured left ankle on Saturday while snow tubing, bruising and swelling noted

## 2019-10-17 NOTE — Discharge Instructions (Addendum)
X-rays did not show fracture or dislocation Continue conservative management of rest, ice, elevation, and gentle stretches Crutches given in office Progress activities as tolerated (remain non-weightbearing with crutches, weight-bearing with crutches, wean off one crutch, to walking without crutches) Take naproxen as needed for pain relief (may cause abdominal discomfort, ulcers, and GI bleeds avoid taking with other NSAIDs) Follow up with occupation health for further evaluation and specific work restrictions Return or go to the ER if you have any new or worsening symptoms (fever, chills, chest pain, increased swelling, redness, pain, bruising, etc...)

## 2019-11-09 ENCOUNTER — Other Ambulatory Visit: Payer: Self-pay

## 2019-11-09 ENCOUNTER — Ambulatory Visit
Admission: EM | Admit: 2019-11-09 | Discharge: 2019-11-09 | Disposition: A | Discharge status: Home / Self Care | Payer: Medicaid Other | Attending: Emergency Medicine | Admitting: Emergency Medicine

## 2019-11-09 DIAGNOSIS — N898 Other specified noninflammatory disorders of vagina: Secondary | ICD-10-CM | POA: Insufficient documentation

## 2019-11-09 DIAGNOSIS — R300 Dysuria: Secondary | ICD-10-CM | POA: Diagnosis not present

## 2019-11-09 LAB — POCT URINALYSIS DIP (MANUAL ENTRY)
Bilirubin, UA: NEGATIVE
Glucose, UA: NEGATIVE mg/dL
Ketones, POC UA: NEGATIVE mg/dL
Nitrite, UA: NEGATIVE
Protein Ur, POC: NEGATIVE mg/dL
Spec Grav, UA: 1.025 (ref 1.010–1.025)
Urobilinogen, UA: 0.2 E.U./dL
pH, UA: 6.5 (ref 5.0–8.0)

## 2019-11-09 LAB — POCT URINE PREGNANCY: Preg Test, Ur: NEGATIVE

## 2019-11-09 MED ORDER — METRONIDAZOLE 500 MG PO TABS: 500.0000 mg | ORAL_TABLET | Freq: Two times a day (BID) | ORAL | 0 refills | Status: DC

## 2019-11-09 MED ORDER — NITROFURANTOIN MONOHYD MACRO 100 MG PO CAPS: 100.0000 mg | ORAL_CAPSULE | Freq: Two times a day (BID) | ORAL | 0 refills | Status: DC

## 2019-11-09 NOTE — Discharge Instructions (Signed)
Urine concerning for UTI Urine culture sent.  We will call you with the results.   Push fluids and get plenty of rest.   Macrobid prescribed.  Take as directed and to completion  Vaginal self swab obtained Declines HIV/ syphilis testing today Prescribed metronidazole 500 mg twice daily for 7 days (do not take while consuming alcohol) Take medications as prescribed and to completion We will follow up with you regarding the results of your test If tests are positive, please abstain from sexual activity for at least 7 days and notify partners  Follow up with PCP if symptoms persists Return here or go to ER if you have any new or worsening symptoms such as fever, worsening abdominal pain, nausea/vomiting, flank pain, persistent symtpoms despite medications, etc..Marland Kitchen

## 2019-11-09 NOTE — ED Provider Notes (Signed)
[facility]   CC: UTI  SUBJECTIVE:  Katelyn Martinez is a 24 y.o. female who complains of clear vaginal discharge, and dysuria x 1 week.  Denies precipitating event or trauma.  Last sex 1 day ago.  3 female partners over the past 3 months.  Denies alleviating factors.  Symptoms are made worse with urination.  Admits to similar symptoms in the past with BV and UTI.  Denies fever, chills, nausea, vomiting, abdominal pain, flank pain, abnormal vaginal discharge or bleeding, hematuria.    LMP: Patient's last menstrual period was 10/28/2019.  ROS: As in HPI.  All other pertinent ROS negative.     Past Medical History:  Diagnosis Date  . Asthma   . 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 Nexplanon insertion 04/03/2018   Inserted right arm 04/03/18  . Recurrent upper respiratory infection (URI)   . Trauma 12/22/2014   feel at [facility] and had skull Fracture    Past Surgical History:  Procedure Laterality Date  . BLADDER SURGERY     uteral reimplantation  . ORIF TIBIA PLATEAU Right 12/29/2014   Procedure: OPEN REDUCTION INTERNAL FIXATION (ORIF) TIBIAL PLATEAU;  Surgeon: Renette Butters, MD;  Location: [facility];  Service: Orthopedics;  Laterality: Right;  . SALIVARY GLAND SURGERY    . TONSILLECTOMY     No Known Allergies No current facility-administered medications on file prior to encounter.   Current Outpatient Medications on File Prior to Encounter  Medication Sig Dispense Refill  . naproxen (NAPROSYN) 375 MG tablet Take 1 tablet (375 mg total) by mouth 2 (two) times daily. 20 tablet 0   Social History   Socioeconomic History  . Marital status: Single    Spouse name: Not on file  . Number of children: 1  . Years of education: Not on file  . Highest education level: Not on file  Occupational History  . Not on file  Tobacco Use  . Smoking status: Passive Smoke Exposure - Never Smoker  . Smokeless tobacco: Never Used  Substance and Sexual Activity  . Alcohol use:  Yes  . Drug use: Not Currently    Types: Marijuana    Comment: denies any at this time-hx reports marijuana  . Sexual activity: Yes    Birth control/protection: None  Other Topics Concern  . Not on file  Social History Narrative  . Not on file   Social Determinants of Health   Financial Resource Strain:   . Difficulty of Paying Living Expenses: Not on file  Food Insecurity:   . Worried About Charity fundraiser in the Last Year: Not on file  . Ran Out of Food in the Last Year: Not on file  Transportation Needs:   . Lack of Transportation (Medical): Not on file  . Lack of Transportation (Non-Medical): Not on file  Physical Activity:   . Days of Exercise per Week: Not on file  . Minutes of Exercise per Session: Not on file  Stress:   . Feeling of Stress : Not on file  Social Connections:   . Frequency of Communication with Friends and Family: Not on file  . Frequency of Social Gatherings with Friends and Family: Not on file  . Attends Religious Services: Not on file  . Active Member of Clubs or Organizations: Not on file  . Attends Archivist Meetings: Not on file  . Marital Status: Not on file  Intimate Partner Violence:   . Fear of  Current or Ex-Partner: Not on file  . Emotionally Abused: Not on file  . Physically Abused: Not on file  . Sexually Abused: Not on file   Family History  Problem Relation Age of Onset  . Ovarian cysts Mother   . Endometriosis Mother   . Fibromyalgia Mother   . Hepatitis C Mother   . Diabetes Mother   . Cancer Maternal Aunt        ovarian  . Cervical cancer Maternal Aunt   . Cancer Maternal Grandfather        bladder  . Cancer Paternal Grandmother        lung  . Bipolar disorder Father   . Suicidality Paternal Grandfather   . Cancer Maternal Grandmother   . Other Sister        mood disorder; patella femoral syndrome    OBJECTIVE:  Vitals:   11/09/19 1532  BP: 105/72  Pulse: 88  Resp: 18  Temp: 98.7 F (37.1 C)    SpO2: 95%   General appearance: Alert in no acute distress HEENT: NCAT.  PERRL, EOMI grossly; Oropharynx clear.  Lungs: clear to auscultation bilaterally without adventitious breath sounds Heart: regular rate and rhythm.   Abdomen: soft; non-distended; no tenderness; bowel sounds present; no guarding Back: no CVA tenderness GU: Deferred; vaginal swab obtained Extremities: no edema; symmetrical with no gross deformities Skin: warm and dry Neurologic: Ambulates from chair to exam table without difficulty Psychological: alert and cooperative; normal mood and affect  Labs Reviewed  POCT URINALYSIS DIP (MANUAL ENTRY) - Abnormal; Notable for the following components:      Result Value   Blood, UA trace-intact (*)    Leukocytes, UA Trace (*)    All other components within normal limits  POCT URINE PREGNANCY  CERVICOVAGINAL ANCILLARY ONLY    ASSESSMENT & PLAN:  1. Dysuria   2. Vaginal discharge     Meds ordered this encounter  Medications  . nitrofurantoin, macrocrystal-monohydrate, (MACROBID) 100 MG capsule    Sig: Take 1 capsule (100 mg total) by mouth 2 (two) times daily.    Dispense:  10 capsule    Refill:  0    Order Specific Question:   Supervising Provider    Answer:   Eustace Moore [[id number]]  . metroNIDAZOLE (FLAGYL) 500 MG tablet    Sig: Take 1 tablet (500 mg total) by mouth 2 (two) times daily.    Dispense:  14 tablet    Refill:  0    Order Specific Question:   Supervising Provider    Answer:   Eustace Moore [[id number]]   Urine concerning for UTI Urine culture sent.  We will call you with the results.   Push fluids and get plenty of rest.   Macrobid prescribed.  Take as directed and to completion  Vaginal self swab obtained Declines HIV/ syphilis testing today Prescribed metronidazole 500 mg twice daily for 7 days (do not take while consuming alcohol) Take medications as prescribed and to completion We will follow up with you regarding the results of  your test If tests are positive, please abstain from sexual activity for at least 7 days and notify partners  Follow up with PCP if symptoms persists Return here or go to ER if you have any new or worsening symptoms such as fever, worsening abdominal pain, nausea/vomiting, flank pain, persistent symtpoms despite medications, etc...   Outlined signs and symptoms indicating need for more acute intervention. Patient verbalized understanding. After Visit Summary given.  Rennis Harding, PA-C 11/09/19 1600

## 2019-11-09 NOTE — ED Triage Notes (Signed)
Pt presents with c/o lower abdominal pain and odorous discharge as well as dsyuria for over a week

## 2019-11-12 ENCOUNTER — Encounter (HOSPITAL_COMMUNITY): Payer: Self-pay

## 2019-11-12 ENCOUNTER — Telehealth (HOSPITAL_COMMUNITY): Payer: Self-pay | Admitting: Emergency Medicine

## 2019-11-12 LAB — URINE CULTURE: Culture: >=100000 — AB

## 2019-11-12 LAB — CERVICOVAGINAL ANCILLARY ONLY
Bacterial vaginitis: POSITIVE — AB
Candida vaginitis: NEGATIVE
Chlamydia: POSITIVE — AB
Neisseria Gonorrhea: NEGATIVE
Trichomonas: NEGATIVE

## 2019-11-12 MED ORDER — AZITHROMYCIN 250 MG PO TABS: 1000.0000 mg | ORAL_TABLET | Freq: Once | ORAL | 0 refills | Status: AC

## 2019-11-12 NOTE — Telephone Encounter (Signed)
Chlamydia is positive.  Rx po zithromax 1g #1 dose no refills was sent to the pharmacy of record.  Please refrain from sexual intercourse for 7 days to give the medicine time to work, sexual partners need to be notified and tested/treated.  Condoms may reduce risk of reinfection.  Recheck or followup with PCP for further evaluation if symptoms are not improving.   [facility] notified.  Bacterial Vaginosis test is positive.  Prescription for metronidazole was given at the urgent care visit.  Attempted to reach patient. No answer at this time. Voicemail not set up.

## 2019-11-13 ENCOUNTER — Telehealth: Payer: Self-pay

## 2019-11-13 MED ORDER — AZITHROMYCIN 250 MG PO TABS: 1000.0000 mg | ORAL_TABLET | Freq: Once | ORAL | 0 refills | Status: AC

## 2019-11-13 NOTE — Telephone Encounter (Signed)
Pt calls [facility] stating she vomited her zithromax dose yesterday. Pt would like another dose. [place] confirms that it is ok to send in another dose of zithromax for pt to her pharmacy. Pt is instructed that taking medication w/ food may help w/ nausea after medication. Pt showed understanding.

## 2020-02-06 ENCOUNTER — Ambulatory Visit: Payer: Medicaid Other | Admitting: Adult Health

## 2020-02-18 ENCOUNTER — Ambulatory Visit
Admission: EM | Admit: 2020-02-18 | Discharge: 2020-02-18 | Disposition: A | Discharge status: Home / Self Care | Payer: Medicaid Other | Attending: Emergency Medicine | Admitting: Emergency Medicine

## 2020-02-18 ENCOUNTER — Encounter: Payer: Self-pay | Admitting: Emergency Medicine

## 2020-02-18 ENCOUNTER — Other Ambulatory Visit: Payer: Self-pay

## 2020-02-18 DIAGNOSIS — R112 Nausea with vomiting, unspecified: Secondary | ICD-10-CM | POA: Diagnosis not present

## 2020-02-18 DIAGNOSIS — R300 Dysuria: Secondary | ICD-10-CM

## 2020-02-18 LAB — POCT URINALYSIS DIP (MANUAL ENTRY)
Bilirubin, UA: NEGATIVE
Glucose, UA: NEGATIVE mg/dL
Ketones, POC UA: NEGATIVE mg/dL
Nitrite, UA: NEGATIVE
Spec Grav, UA: 1.02 (ref 1.010–1.025)
Urobilinogen, UA: 1 E.U./dL
pH, UA: 7.5 (ref 5.0–8.0)

## 2020-02-18 LAB — POCT URINE PREGNANCY: Preg Test, Ur: NEGATIVE

## 2020-02-18 MED ORDER — CEPHALEXIN 500 MG PO CAPS: 500.0000 mg | ORAL_CAPSULE | Freq: Two times a day (BID) | ORAL | 0 refills | Status: AC

## 2020-02-18 MED ORDER — MECLIZINE HCL 12.5 MG PO TABS: 12.5000 mg | ORAL_TABLET | Freq: Three times a day (TID) | ORAL | 0 refills | Status: DC | PRN

## 2020-02-18 NOTE — ED Provider Notes (Signed)
[facility]   CC: Burning with urination  SUBJECTIVE:  Katelyn Martinez is a 24 y.o. female who presented to the urgent care for complaint of headache, nausea and vomiting, dysuria and cloudy urine for the past 1 to 2 weeks.  Patient has concern about being pregnant.  Denies a precipitating event, excessive caffeine intake.  Has tried OTC medications without relief.  Symptoms are made worse with urination.  Admits to similar symptoms in the past.  Denies fever, chills, nausea, vomiting, abdominal pain, flank pain, abnormal vaginal discharge or bleeding, hematuria.    LMP: Patient's last menstrual period was 01/18/2020.  ROS: As in HPI.  All other pertinent ROS negative.     Past Medical History:  Diagnosis Date  . Asthma   . 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 Nexplanon insertion 04/03/2018   Inserted right arm 04/03/18  . Recurrent upper respiratory infection (URI)   . Trauma 12/22/2014   feel at [facility] and had skull Fracture    Past Surgical History:  Procedure Laterality Date  . BLADDER SURGERY     uteral reimplantation  . ORIF TIBIA PLATEAU Right 12/29/2014   Procedure: OPEN REDUCTION INTERNAL FIXATION (ORIF) TIBIAL PLATEAU;  Surgeon: Sheral Apley, MD;  Location: [facility];  Service: Orthopedics;  Laterality: Right;  . SALIVARY GLAND SURGERY    . TONSILLECTOMY     No Known Allergies No current facility-administered medications on file prior to encounter.   Current Outpatient Medications on File Prior to Encounter  Medication Sig Dispense Refill  . metroNIDAZOLE (FLAGYL) 500 MG tablet Take 1 tablet (500 mg total) by mouth 2 (two) times daily. 14 tablet 0  . naproxen (NAPROSYN) 375 MG tablet Take 1 tablet (375 mg total) by mouth 2 (two) times daily. 20 tablet 0  . nitrofurantoin, macrocrystal-monohydrate, (MACROBID) 100 MG capsule Take 1 capsule (100 mg total) by mouth 2 (two) times daily. 10 capsule 0   Social History   Socioeconomic History  . Marital  status: Single    Spouse name: Not on file  . Number of children: 1  . Years of education: Not on file  . Highest education level: Not on file  Occupational History  . Not on file  Tobacco Use  . Smoking status: Passive Smoke Exposure - Never Smoker  . Smokeless tobacco: Never Used  Substance and Sexual Activity  . Alcohol use: Yes  . Drug use: Not Currently    Types: Marijuana    Comment: denies any at this time-hx reports marijuana  . Sexual activity: Yes    Birth control/protection: None  Other Topics Concern  . Not on file  Social History Narrative  . Not on file   Social Determinants of Health   Financial Resource Strain:   . Difficulty of Paying Living Expenses:   Food Insecurity:   . Worried About Programme researcher, broadcasting/film/video in the Last Year:   . Barista in the Last Year:   Transportation Needs:   . Freight forwarder (Medical):   Marland Kitchen Lack of Transportation (Non-Medical):   Physical Activity:   . Days of Exercise per Week:   . Minutes of Exercise per Session:   Stress:   . Feeling of Stress :   Social Connections:   . Frequency of Communication with Friends and Family:   . Frequency of Social Gatherings with Friends and Family:   . Attends Religious Services:   . Active Member of Clubs  or Organizations:   . Attends Archivist Meetings:   Marland Kitchen Marital Status:   Intimate Partner Violence:   . Fear of Current or Ex-Partner:   . Emotionally Abused:   Marland Kitchen Physically Abused:   . Sexually Abused:    Family History  Problem Relation Age of Onset  . Ovarian cysts Mother   . Endometriosis Mother   . Fibromyalgia Mother   . Hepatitis C Mother   . Diabetes Mother   . Cancer Maternal Aunt        ovarian  . Cervical cancer Maternal Aunt   . Cancer Maternal Grandfather        bladder  . Cancer Paternal Grandmother        lung  . Bipolar disorder Father   . Suicidality Paternal Grandfather   . Cancer Maternal Grandmother   . Other Sister        mood  disorder; patella femoral syndrome    OBJECTIVE:  Vitals:   02/18/20 1941 02/18/20 1943  BP:  108/73  Pulse:  74  Resp:  17  Temp:  98.7 F (37.1 C)  TempSrc:  Oral  SpO2:  96%  Weight: 114 lb 10.2 oz (52 kg)   Height: 5\' 5"  (1.651 m)    General appearance: AOx3 in no acute distress HEENT: NCAT.  Oropharynx clear.  Lungs: clear to auscultation bilaterally without adventitious breath sounds Heart: regular rate and rhythm.  Radial pulses 2+ symmetrical bilaterally Abdomen: soft; non-distended; no tenderness; bowel sounds present; no guarding or rebound tenderness Back: no CVA tenderness Extremities: no edema; symmetrical with no gross deformities Skin: warm and dry Neurologic: Ambulates from chair to exam table without difficulty Psychological: alert and cooperative; normal mood and affect  Labs Reviewed  POCT URINALYSIS DIP (MANUAL ENTRY) - Abnormal; Notable for the following components:      Result Value   Blood, UA trace-intact (*)    Protein Ur, POC trace (*)    Leukocytes, UA Trace (*)    All other components within normal limits  URINE CULTURE  POCT URINE PREGNANCY    ASSESSMENT & PLAN:  1. Dysuria   2. Non-intractable vomiting with nausea, unspecified vomiting type    Antibiotics prescribed as patient is a symptomatic.  Keflex was prescribed for possible UTI.  Antibiotics prescribed for nausea  Meds ordered this encounter  Medications  . cephALEXin (KEFLEX) 500 MG capsule    Sig: Take 1 capsule (500 mg total) by mouth 2 (two) times daily for 5 days.    Dispense:  10 capsule    Refill:  0  . meclizine (ANTIVERT) 12.5 MG tablet    Sig: Take 1 tablet (12.5 mg total) by mouth 3 (three) times daily as needed for nausea.    Dispense:  30 tablet    Refill:  0   Discharge instructions POCT urine analysis showed trace of blood and trace of leukocyte.  POCT urine pregnancy test was negative Urine culture sent.  We will call you with the results.   Push fluids  and get plenty of rest.   Take antibiotic as directed and to completion Take pyridium as prescribed and as needed for symptomatic relief Follow up with PCP if symptoms persists Return here or go to ER if you have any new or worsening symptoms such as fever, worsening abdominal pain, nausea/vomiting, flank pain, etc...  Outlined signs and symptoms indicating need for more acute intervention. Patient verbalized understanding. After Visit Summary given.  Durward Parcel, FNP 02/18/20 2013

## 2020-02-18 NOTE — Discharge Instructions (Addendum)
POCT urine analysis showed trace of blood and trace of leukocyte.  POCT urine pregnancy test was negative Urine culture sent.  We will call you with the results.   Push fluids and get plenty of rest.   Take antibiotic as directed and to completion Take pyridium as prescribed and as needed for symptomatic relief Follow up with PCP if symptoms persists Return here or go to ER if you have any new or worsening symptoms such as fever, worsening abdominal pain, nausea/vomiting, flank pain, etc..Marland Kitchen

## 2020-02-18 NOTE — ED Triage Notes (Signed)
Vomited x 1 today and headache when she got to work. Burning with urination and cloudy urine x 1-2 weeks. Concerned for pregnancy as well.

## 2020-02-21 LAB — URINE CULTURE: Culture: 70000 — AB

## 2020-05-15 ENCOUNTER — Encounter: Payer: Self-pay | Admitting: Adult Health

## 2020-05-15 ENCOUNTER — Ambulatory Visit (INDEPENDENT_AMBULATORY_CARE_PROVIDER_SITE_OTHER): Payer: Medicaid Other | Admitting: Adult Health

## 2020-05-15 ENCOUNTER — Other Ambulatory Visit (HOSPITAL_COMMUNITY)
Admission: RE | Admit: 2020-05-15 | Discharge: 2020-05-15 | Disposition: A | Discharge status: Home / Self Care | Payer: Medicaid Other | Source: Ambulatory Visit | Attending: Adult Health | Admitting: Adult Health

## 2020-05-15 VITALS — BP 112/71 | HR 80 | Ht 65.0 in | Wt 117.0 lb

## 2020-05-15 DIAGNOSIS — Z30013 Encounter for initial prescription of injectable contraceptive: Secondary | ICD-10-CM | POA: Insufficient documentation

## 2020-05-15 DIAGNOSIS — Z3202 Encounter for pregnancy test, result negative: Secondary | ICD-10-CM

## 2020-05-15 DIAGNOSIS — R300 Dysuria: Secondary | ICD-10-CM

## 2020-05-15 DIAGNOSIS — N760 Acute vaginitis: Secondary | ICD-10-CM

## 2020-05-15 DIAGNOSIS — Z113 Encounter for screening for infections with a predominantly sexual mode of transmission: Secondary | ICD-10-CM | POA: Insufficient documentation

## 2020-05-15 DIAGNOSIS — R319 Hematuria, unspecified: Secondary | ICD-10-CM | POA: Diagnosis not present

## 2020-05-15 DIAGNOSIS — F32A Depression, unspecified: Secondary | ICD-10-CM

## 2020-05-15 DIAGNOSIS — B9689 Other specified bacterial agents as the cause of diseases classified elsewhere: Secondary | ICD-10-CM | POA: Diagnosis not present

## 2020-05-15 DIAGNOSIS — B9620 Unspecified Escherichia coli [E. coli] as the cause of diseases classified elsewhere: Secondary | ICD-10-CM | POA: Diagnosis not present

## 2020-05-15 DIAGNOSIS — F329 Major depressive disorder, single episode, unspecified: Secondary | ICD-10-CM | POA: Diagnosis not present

## 2020-05-15 DIAGNOSIS — N390 Urinary tract infection, site not specified: Secondary | ICD-10-CM | POA: Diagnosis not present

## 2020-05-15 LAB — POCT URINALYSIS DIPSTICK
Glucose, UA: NEGATIVE
Ketones, UA: NEGATIVE
Leukocytes, UA: NEGATIVE
Nitrite, UA: POSITIVE
Protein, UA: NEGATIVE

## 2020-05-15 LAB — POCT URINE PREGNANCY: Preg Test, Ur: NEGATIVE

## 2020-05-15 MED ORDER — MEDROXYPROGESTERONE ACETATE 150 MG/ML IM SUSP
150.0000 mg | Freq: Once | INTRAMUSCULAR | Status: AC
  Administered 2020-05-15: 150 mg via INTRAMUSCULAR

## 2020-05-15 MED ORDER — SULFAMETHOXAZOLE-TRIMETHOPRIM 800-160 MG PO TABS: 1.0000 | ORAL_TABLET | Freq: Two times a day (BID) | ORAL | 0 refills | Status: DC

## 2020-05-15 MED ORDER — MEDROXYPROGESTERONE ACETATE 150 MG/ML IM SUSP: 150.0000 mg | INTRAMUSCULAR | 4 refills | Status: DC

## 2020-05-15 NOTE — Progress Notes (Signed)
Patient ID: Katelyn Martinez, female   DOB: 1996-09-03, 24 y.o.   MRN: [id number] History of Present Illness: Katelyn Martinez is a 24 year old white female, single, G1P1 in requesting STD testing and ?UTI,has burning when she pees, and wants to get on depo.   Current Medications, Allergies, Past Medical History, Past Surgical History, Family History and Social History were reviewed in [employer] record.     Review of Systems: +burning when she pees Last sex 3 weeks ago, he did not ejaculate    Physical Exam:BP 112/71 (BP Location: Left Arm, Patient Position: Sitting, Cuff Size: Normal)   Pulse 80   Ht 5\' 5"  (1.651 m)   Wt 117 lb (53.1 kg)   LMP 04/23/2020 (Exact Date)   BMI 19.47 kg/m  UPT is negative Urine dipstick +nitrates and trace blood General:  Well developed, well nourished, no acute distress Skin:  Warm and dry,lots of tattoos Lungs; Clear to auscultation bilaterally Cardiovascular: Regular rate and rhythm Pelvic:  External genitalia is normal in appearance, no lesions.  The vagina is normal in appearance, has white discharge with slight odor. Urethra has no lesions or masses. The cervix is bulbous.  Uterus is felt to be normal size, shape, and contour.  No adnexal masses or tenderness noted.Bladder is non tender, no masses felt. CV swab obtained. Extremities/musculoskeletal:  No swelling or varicosities noted, no clubbing or cyanosis Psych:  No mood changes, alert and cooperative,seems happy PHQ 9 score is 10, denies any SI or HI and declines meds or counseling.   [facility] - 05/15/20 0956      Pregnancy Intention Screening   Does the patient want to become pregnant in the next year? No    Does the patient's partner want to become pregnant in the next year? Yes    Would the patient like to discuss contraceptive options today? Yes      Contraception Wrap Up   Current Method No Method - Other Reason    End Method Hormonal Injection    Contraception  Counseling Provided Yes         Examination chaperoned by 05/17/20 LPN  Impression and Plan:  1. Burning with urination UA C&S sent  2. Hematuria, unspecified type  3. Urine pregnancy test negative   4. Screening examination for STD (sexually transmitted disease) CV swab sent  5. Urinary tract infection with hematuria, site unspecified UA C& S sent Will rx septra ds  6. Encounter for initial prescription of injectable contraceptive Will give depo today from office stock and rx sent Use condoms   Meds ordered this encounter  Medications  . medroxyPROGESTERone (DEPO-PROVERA) 150 MG/ML injection    Sig: Inject 1 mL (150 mg total) into the muscle every 3 (three) months.    Dispense:  1 mL    Refill:  4    Order Specific Question:   Supervising Provider    Answer:   Nance Pear, LUTHER H [[id number]]  . sulfamethoxazole-trimethoprim (BACTRIM DS) 800-160 MG tablet    Sig: Take 1 tablet by mouth 2 (two) times daily. Take 1 bid    Dispense:  14 tablet    Refill:  0    Order Specific Question:   Supervising Provider    Answer:   Despina Hidden, LUTHER H [[id number]]  . medroxyPROGESTERone (DEPO-PROVERA) injection 150 mg    7. Depression, unspecified depression type Will follow for now  Return in 12 weeks for depo and physical with first pap

## 2020-05-16 LAB — URINALYSIS, ROUTINE W REFLEX MICROSCOPIC
Bilirubin, UA: NEGATIVE
Glucose, UA: NEGATIVE
Ketones, UA: NEGATIVE
Nitrite, UA: POSITIVE — AB
Protein,UA: NEGATIVE
Specific Gravity, UA: 1.017 (ref 1.005–1.030)
Urobilinogen, Ur: 0.2 mg/dL (ref 0.2–1.0)
pH, UA: 6 (ref 5.0–7.5)

## 2020-05-16 LAB — MICROSCOPIC EXAMINATION
Bacteria, UA: NONE SEEN
Casts: NONE SEEN /lpf
Epithelial Cells (non renal): NONE SEEN /hpf (ref 0–10)
RBC, Urine: NONE SEEN /hpf (ref 0–2)
WBC, UA: NONE SEEN /hpf (ref 0–5)

## 2020-05-18 ENCOUNTER — Telehealth: Payer: Self-pay | Admitting: Adult Health

## 2020-05-18 LAB — CERVICOVAGINAL ANCILLARY ONLY
Bacterial Vaginitis (gardnerella): POSITIVE — AB
Candida Glabrata: NEGATIVE
Candida Vaginitis: NEGATIVE
Chlamydia: NEGATIVE
Comment: NEGATIVE
Comment: NEGATIVE
Comment: NEGATIVE
Comment: NEGATIVE
Comment: NEGATIVE
Comment: NORMAL
Neisseria Gonorrhea: NEGATIVE
Trichomonas: NEGATIVE

## 2020-05-18 LAB — URINE CULTURE

## 2020-05-18 NOTE — Telephone Encounter (Signed)
Pt aware of results. Also, pt wants to have HIV and RPR labs checked tomorrow. Pt was placed on lab schedule for tomorrow afternoon. JSY

## 2020-05-18 NOTE — Telephone Encounter (Signed)
Patient called stating that she would like to know the results of her blood work. Pt states that she would like an order placed for her to check for Syphilis and HIV. Please contact pt

## 2020-05-19 ENCOUNTER — Other Ambulatory Visit: Payer: Medicaid Other

## 2020-05-19 ENCOUNTER — Telehealth: Payer: Self-pay | Admitting: Adult Health

## 2020-05-19 DIAGNOSIS — Z113 Encounter for screening for infections with a predominantly sexual mode of transmission: Secondary | ICD-10-CM | POA: Diagnosis not present

## 2020-05-19 MED ORDER — METRONIDAZOLE 500 MG PO TABS: 500.0000 mg | ORAL_TABLET | Freq: Two times a day (BID) | ORAL | 0 refills | Status: DC

## 2020-05-19 NOTE — Addendum Note (Signed)
Addended by: Cyril Mourning A on: 05/19/2020 04:08 PM   Modules accepted: Orders

## 2020-05-19 NOTE — Telephone Encounter (Signed)
Patient would like medicine sent to pharmacy patient states her pharmacy has not received flagyl yet.

## 2020-05-19 NOTE — Telephone Encounter (Signed)
Will rx flagyl  

## 2020-05-20 LAB — RPR: RPR Ser Ql: NONREACTIVE

## 2020-05-20 LAB — HIV ANTIBODY (ROUTINE TESTING W REFLEX): HIV Screen 4th Generation wRfx: NONREACTIVE

## 2020-05-23 ENCOUNTER — Ambulatory Visit
Admission: EM | Admit: 2020-05-23 | Discharge: 2020-05-23 | Disposition: A | Discharge status: Home / Self Care | Payer: Medicaid Other | Attending: Emergency Medicine | Admitting: Emergency Medicine

## 2020-05-23 ENCOUNTER — Other Ambulatory Visit: Payer: Self-pay

## 2020-05-23 DIAGNOSIS — J069 Acute upper respiratory infection, unspecified: Secondary | ICD-10-CM

## 2020-05-23 DIAGNOSIS — Z20822 Contact with and (suspected) exposure to covid-19: Secondary | ICD-10-CM | POA: Diagnosis not present

## 2020-05-23 MED ORDER — BENZONATATE 100 MG PO CAPS: 100.0000 mg | ORAL_CAPSULE | Freq: Three times a day (TID) | ORAL | 0 refills | Status: DC

## 2020-05-23 MED ORDER — CETIRIZINE-PSEUDOEPHEDRINE ER 5-120 MG PO TB12: 1.0000 | ORAL_TABLET | Freq: Every day | ORAL | 0 refills | Status: DC

## 2020-05-23 NOTE — ED Provider Notes (Signed)
[facility]   [id number] 05/23/20 Arrival Time: 1115   CC: COVID symptoms  SUBJECTIVE: History from: patient.  Katelyn Martinez is a 24 y.o. female who presents with sore throat, cough, and HA x 2 days.  Denies sick exposure to COVID, flu or strep.  Denies alleviating or aggravating factors.  Denies SOB, wheezing, chest pain, nausea, changes in bowel or bladder habits.    ROS: As per HPI.  All other pertinent ROS negative.     Past Medical History:  Diagnosis Date  . Asthma   . 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 Nexplanon insertion 04/03/2018   Inserted right arm 04/03/18  . Recurrent upper respiratory infection (URI)   . Trauma 12/22/2014   feel at [facility] and had skull Fracture    Past Surgical History:  Procedure Laterality Date  . BLADDER SURGERY     uteral reimplantation  . ORIF TIBIA PLATEAU Right 12/29/2014   Procedure: OPEN REDUCTION INTERNAL FIXATION (ORIF) TIBIAL PLATEAU;  Surgeon: Sheral Apley, MD;  Location: [facility] OR;  Service: Orthopedics;  Laterality: Right;  . SALIVARY GLAND SURGERY    . TONSILLECTOMY     No Known Allergies No current facility-administered medications on file prior to encounter.   Current Outpatient Medications on File Prior to Encounter  Medication Sig Dispense Refill  . medroxyPROGESTERone (DEPO-PROVERA) 150 MG/ML injection Inject 1 mL (150 mg total) into the muscle every 3 (three) months. 1 mL 4   Social History   Socioeconomic History  . Marital status: Single    Spouse name: Not on file  . Number of children: 1  . Years of education: Not on file  . Highest education level: Not on file  Occupational History  . Not on file  Tobacco Use  . Smoking status: Passive Smoke Exposure - Never Smoker  . Smokeless tobacco: Never Used  Vaping Use  . Vaping Use: Never used  Substance and Sexual Activity  . Alcohol use: Yes    Comment: socially  . Drug use: Yes    Frequency: 7.0 times per week    Types: Marijuana  .  Sexual activity: Not Currently    Birth control/protection: None    Comment: over s month  Other Topics Concern  . Not on file  Social History Narrative  . Not on file   Social Determinants of Health   Financial Resource Strain:   . Difficulty of Paying Living Expenses: Not on file  Food Insecurity:   . Worried About Programme researcher, broadcasting/film/video in the Last Year: Not on file  . Ran Out of Food in the Last Year: Not on file  Transportation Needs:   . Lack of Transportation (Medical): Not on file  . Lack of Transportation (Non-Medical): Not on file  Physical Activity:   . Days of Exercise per Week: Not on file  . Minutes of Exercise per Session: Not on file  Stress:   . Feeling of Stress : Not on file  Social Connections:   . Frequency of Communication with Friends and Family: Not on file  . Frequency of Social Gatherings with Friends and Family: Not on file  . Attends Religious Services: Not on file  . Active Member of Clubs or Organizations: Not on file  . Attends Banker Meetings: Not on file  . Marital Status: Not on file  Intimate Partner Violence:   . Fear of Current or Ex-Partner: Not on file  . Emotionally Abused:  Not on file  . Physically Abused: Not on file  . Sexually Abused: Not on file   Family History  Problem Relation Age of Onset  . Ovarian cysts Mother   . Endometriosis Mother   . Fibromyalgia Mother   . Hepatitis C Mother   . Diabetes Mother   . Cancer Maternal Aunt        ovarian  . Cervical cancer Maternal Aunt   . Cancer Maternal Grandfather        bladder  . Cancer Paternal Grandmother        lung  . Bipolar disorder Father   . Suicidality Paternal Grandfather   . Cancer Maternal Grandmother   . Other Sister        mood disorder; patella femoral syndrome    OBJECTIVE:  Vitals:   05/23/20 1230  BP: 108/64  Pulse: 73  Resp: 17  Temp: 97.8 F (36.6 C)  TempSrc: Tympanic  SpO2: 98%     General appearance: alert; well-appearing,  nontoxic; speaking in full sentences and tolerating own secretions HEENT: NCAT; Ears: EACs clear, TMs pearly gray; Eyes: PERRL.  EOM grossly intact.Nose: nares patent without rhinorrhea, Throat: oropharynx clear, tonsils non erythematous or enlarged, uvula midline  Neck: supple without LAD Lungs: unlabored respirations, symmetrical air entry; cough: absent; no respiratory distress; CTAB Heart: regular rate and rhythm.  Skin: warm and dry Psychological: alert and cooperative; normal mood and affect   ASSESSMENT & PLAN:  1. Encounter for laboratory testing for COVID-19 virus   2. Viral URI with cough   3. Suspected COVID-19 virus infection     Meds ordered this encounter  Medications  . cetirizine-pseudoephedrine (ZYRTEC-D) 5-120 MG tablet    Sig: Take 1 tablet by mouth daily.    Dispense:  30 tablet    Refill:  0    Order Specific Question:   Supervising Provider    Answer:   Eustace Moore [[id number]]  . benzonatate (TESSALON) 100 MG capsule    Sig: Take 1 capsule (100 mg total) by mouth every 8 (eight) hours.    Dispense:  21 capsule    Refill:  0    Order Specific Question:   Supervising Provider    Answer:   Eustace Moore [[id number]]   COVID testing ordered.  It will take between 5-7 days for test results.  Someone will contact you regarding abnormal results.    In the meantime: You should remain isolated in your home for 10 days from symptom onset AND greater than 72 hours after symptoms resolution (absence of fever without the use of fever-reducing medication and improvement in respiratory symptoms), whichever is longer Get plenty of rest and push fluids Tessalon Perles prescribed for cough Zyrtec d for congestion and runny nose Use medications daily for symptom relief Use OTC medications like ibuprofen or tylenol as needed fever or pain Call or go to the ED if you have any new or worsening symptoms such as fever, worsening cough, shortness of breath, chest tightness,  chest pain, turning blue, changes in mental status, etc...   Reviewed expectations re: course of current medical issues. Questions answered. Outlined signs and symptoms indicating need for more acute intervention. Patient verbalized understanding. After Visit Summary given.         Rennis Harding, PA-C 05/23/20 1238

## 2020-05-23 NOTE — ED Triage Notes (Signed)
Pt triaged and dc  by provider  

## 2020-05-23 NOTE — Discharge Instructions (Signed)
COVID testing ordered.  It will take between 5-7 days for test results.  Someone will contact you regarding abnormal results.    In the meantime: You should remain isolated in your home for 10 days from symptom onset AND greater than 72 hours after symptoms resolution (absence of fever without the use of fever-reducing medication and improvement in respiratory symptoms), whichever is longer Get plenty of rest and push fluids Tessalon Perles prescribed for cough Zyrtec d for congestion and runny nose Use medications daily for symptom relief Use OTC medications like ibuprofen or tylenol as needed fever or pain Call or go to the ED if you have any new or worsening symptoms such as fever, worsening cough, shortness of breath, chest tightness, chest pain, turning blue, changes in mental status, etc..Marland Kitchen

## 2020-05-24 LAB — NOVEL CORONAVIRUS, NAA: SARS-CoV-2, NAA: NOT DETECTED

## 2020-06-02 ENCOUNTER — Telehealth: Payer: Self-pay | Admitting: Adult Health

## 2020-06-02 MED ORDER — FLUCONAZOLE 150 MG PO TABS: ORAL_TABLET | ORAL | 1 refills | Status: DC

## 2020-06-02 NOTE — Telephone Encounter (Signed)
Pt wanted to see if something could be called into [facility] for a yeast infection she has gotten from the antibiotic Jenn gave her

## 2020-06-02 NOTE — Addendum Note (Signed)
Addended by: Cyril Mourning A on: 06/02/2020 04:55 PM   Modules accepted: Orders

## 2020-06-02 NOTE — Telephone Encounter (Signed)
Telephoned patient at home number. Patient states was prescribed antibiotic for UTI and now has yeast infection. Patient to check with pharmacy later.

## 2020-06-02 NOTE — Telephone Encounter (Signed)
Will rx diflucan  

## 2020-07-28 ENCOUNTER — Other Ambulatory Visit: Payer: Medicaid Other | Admitting: Adult Health

## 2020-08-03 ENCOUNTER — Ambulatory Visit (INDEPENDENT_AMBULATORY_CARE_PROVIDER_SITE_OTHER): Payer: Medicaid Other | Admitting: Adult Health

## 2020-08-03 ENCOUNTER — Other Ambulatory Visit: Payer: Self-pay

## 2020-08-03 ENCOUNTER — Encounter: Payer: Self-pay | Admitting: Adult Health

## 2020-08-03 ENCOUNTER — Other Ambulatory Visit (HOSPITAL_COMMUNITY)
Admission: RE | Admit: 2020-08-03 | Discharge: 2020-08-03 | Disposition: A | Discharge status: Home / Self Care | Payer: Medicaid Other | Source: Ambulatory Visit | Attending: Adult Health | Admitting: Adult Health

## 2020-08-03 VITALS — BP 108/68 | HR 72 | Ht 65.0 in | Wt 113.0 lb

## 2020-08-03 DIAGNOSIS — Z124 Encounter for screening for malignant neoplasm of cervix: Secondary | ICD-10-CM | POA: Insufficient documentation

## 2020-08-03 DIAGNOSIS — Z01419 Encounter for gynecological examination (general) (routine) without abnormal findings: Secondary | ICD-10-CM | POA: Insufficient documentation

## 2020-08-03 NOTE — Progress Notes (Signed)
Patient ID: Katelyn Martinez, female   DOB: [birth year], 24 y.o.   MRN: [id number] History of Present Illness: Katelyn Martinez is a 24 year old white female,single in for well woman gyn exam and first pap. Her 43 yo son is with her.    Current Medications, Allergies, Past Medical History, Past Surgical History, Family History and Social History were reviewed in [employer] record.     Review of Systems: Patient denies any headaches, hearing loss, fatigue, blurred vision, shortness of breath, chest pain, abdominal pain, problems with bowel movements, urination, or intercourse(occasional discomfort). No joint pain or mood swings. She did not like depo, so stopped it.   Physical Exam:BP 108/68 (BP Location: Left Arm, Patient Position: Sitting, Cuff Size: Normal)   Pulse 72   Ht 5\' 5"  (1.651 m)   Wt 113 lb (51.3 kg)   LMP 07/25/2020 (Approximate)   BMI 18.80 kg/m  General:  Well developed, well nourished, no acute distress Skin:  Warm and dry,has numerous tattoos  Neck:  Midline trachea, normal thyroid, good ROM, no lymphadenopathy Lungs; Clear to auscultation bilaterally Breast:  No dominant palpable mass, retraction, or nipple discharge,has bilateral nipple rods Cardiovascular: Regular rate and rhythm Abdomen:  Soft, non tender, no hepatosplenomegaly Pelvic:  External genitalia is normal in appearance, no lesions.  The vagina is normal in appearance. Urethra has no lesions or masses. The cervix is bulbous,pap with GC.CHL performed.  Uterus is felt to be normal size, shape, and contour.  No adnexal masses or tenderness noted.Bladder is non tender, no masses felt. Extremities/musculoskeletal:  No swelling or varicosities noted, no clubbing or cyanosis Psych:  No mood changes, alert and cooperative,seems happy Examination chaperoned by Angel,RN. AA is 4 Fall risk is low PHQ 9 score is 8, no SI, declines meds,she says she is actually better, has job that pays good finally     [facility] - 08/03/20 1145      Pregnancy Intention Screening   Does the patient want to become pregnant in the next year? Yes    Does the patient's partner want to become pregnant in the next year? Yes    Would the patient like to discuss contraceptive options today? No      Contraception Wrap Up   Current Method Hormonal Injection   No longer wants to take   End Method Pregnant/Seeking Pregnancy    Contraception Counseling Provided No           Impression and Plan:  1. Routine cervical smear Pap sent   2. Encounter for gynecological examination with Papanicolaou smear of cervix Pap sent Physical in 1 year  Pap in 3 if normal Take PNV or folic acid OTC

## 2020-08-05 LAB — CYTOLOGY - PAP
Chlamydia: NEGATIVE
Comment: NEGATIVE
Comment: NORMAL
Diagnosis: NEGATIVE
Neisseria Gonorrhea: NEGATIVE

## 2020-08-07 ENCOUNTER — Telehealth: Payer: Self-pay | Admitting: Adult Health

## 2020-08-07 MED ORDER — FLUCONAZOLE 150 MG PO TABS: ORAL_TABLET | ORAL | 1 refills | Status: DC

## 2020-08-07 NOTE — Telephone Encounter (Signed)
Will rx diflucan  

## 2020-08-07 NOTE — Addendum Note (Signed)
Addended by: Cyril Mourning A on: 08/07/2020 02:01 PM   Modules accepted: Orders

## 2020-08-07 NOTE — Telephone Encounter (Signed)
Patient called stating that she has a question regarding her last visit, please contact pt

## 2020-08-07 NOTE — Telephone Encounter (Signed)
Pt has a discharge and vaginal irritation. Pt feels like it's a yeast infection.

## 2020-08-29 ENCOUNTER — Ambulatory Visit
Admission: EM | Admit: 2020-08-29 | Discharge: 2020-08-29 | Disposition: A | Discharge status: Home / Self Care | Payer: Medicaid Other | Attending: Internal Medicine | Admitting: Internal Medicine

## 2020-08-29 ENCOUNTER — Other Ambulatory Visit: Payer: Self-pay

## 2020-08-29 DIAGNOSIS — N3001 Acute cystitis with hematuria: Secondary | ICD-10-CM | POA: Diagnosis not present

## 2020-08-29 LAB — POCT URINALYSIS DIP (MANUAL ENTRY)
Bilirubin, UA: NEGATIVE
Glucose, UA: NEGATIVE mg/dL
Ketones, POC UA: NEGATIVE mg/dL
Nitrite, UA: POSITIVE — AB
Protein Ur, POC: NEGATIVE mg/dL
Spec Grav, UA: 1.02 (ref 1.010–1.025)
Urobilinogen, UA: 0.2 E.U./dL
pH, UA: 5.5 (ref 5.0–8.0)

## 2020-08-29 LAB — POCT URINE PREGNANCY: Preg Test, Ur: NEGATIVE

## 2020-08-29 MED ORDER — NITROFURANTOIN MONOHYD MACRO 100 MG PO CAPS: 100.0000 mg | ORAL_CAPSULE | Freq: Two times a day (BID) | ORAL | 0 refills | Status: DC

## 2020-08-29 NOTE — ED Triage Notes (Signed)
Pt presents with dysuria anf frequency , also wants std screen for vaginal discomfort

## 2020-08-29 NOTE — Discharge Instructions (Signed)
Please take medications as prescribed We will call you with recommendations once we get the urine cultures results Tylenol as needed for pain or fever.

## 2020-08-31 ENCOUNTER — Telehealth (HOSPITAL_COMMUNITY): Payer: Self-pay | Admitting: Emergency Medicine

## 2020-08-31 LAB — CERVICOVAGINAL ANCILLARY ONLY
Bacterial Vaginitis (gardnerella): POSITIVE — AB
Candida Glabrata: NEGATIVE
Candida Vaginitis: NEGATIVE
Chlamydia: NEGATIVE
Comment: NEGATIVE
Comment: NEGATIVE
Comment: NEGATIVE
Comment: NEGATIVE
Comment: NEGATIVE
Comment: NORMAL
Neisseria Gonorrhea: NEGATIVE
Trichomonas: NEGATIVE

## 2020-08-31 MED ORDER — METRONIDAZOLE 500 MG PO TABS: 500.0000 mg | ORAL_TABLET | Freq: Two times a day (BID) | ORAL | 0 refills | Status: DC

## 2020-09-01 LAB — URINE CULTURE: Culture: >=100000 — AB

## 2020-09-01 NOTE — ED Provider Notes (Signed)
[facility]    CSN: [id number] Arrival date & time: 08/29/20  1537      History   Chief Complaint Chief Complaint  Patient presents with  . Dysuria    HPI Katelyn Martinez is a 24 y.o. female comes to urgent care with complaints of dysuria, urgency and frequency as well as vaginal discharge.  Patient describes the vaginal discharge as thick, white and clumpy.  Is associated with itching in the perineal area.  No fever or chills.  She has some dysuria and frequency of micturition.  No flank pain.  No nausea, vomiting or diarrhea.    HPI  Past Medical History:  Diagnosis Date  . Asthma   . 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 Nexplanon insertion 04/03/2018   Inserted right arm 04/03/18  . Recurrent upper respiratory infection (URI)   . Trauma 12/22/2014   feel at [facility] and had skull Fracture     Patient Active Problem List   Diagnosis Date Noted  . Encounter for gynecological examination with Papanicolaou smear of cervix 08/03/2020  . Routine cervical smear 08/03/2020  . Encounter for initial prescription of injectable contraceptive 05/15/2020  . Urinary tract infection with hematuria 05/15/2020  . Screening examination for STD (sexually transmitted disease) 05/15/2020  . Hematuria 05/15/2020  . Burning with urination 05/15/2020  . Urine pregnancy test negative 05/15/2020  . Depression 05/15/2020  . COVID-19 virus detected 11/25 08/19/2019  . Tibial plateau fracture, right 12/24/2014  . Fracture of right fibula 12/24/2014  . Back abrasion 12/24/2014  . Lumbar transverse process fracture (HCC) 12/24/2014  . Asthma 12/24/2014  . Anxiety disorder 12/24/2014  . Intracranial hemorrhage (HCC)   . Pneumocephalus, traumatic   . Pulmonary contusion   . Temporal bone fracture (HCC)   . Trauma   . Basilar skull fracture (HCC) 12/23/2014  . Fall by pediatric patient     Past Surgical History:  Procedure Laterality Date  . BLADDER SURGERY     uteral  reimplantation  . ORIF TIBIA PLATEAU Right 12/29/2014   Procedure: OPEN REDUCTION INTERNAL FIXATION (ORIF) TIBIAL PLATEAU;  Surgeon: Sheral Apley, MD;  Location: [facility] OR;  Service: Orthopedics;  Laterality: Right;  . SALIVARY GLAND SURGERY    . TONSILLECTOMY      OB History    Gravida  1   Para  1   Term  1   Preterm      AB      Living  1     SAB      IAB      Ectopic      Multiple  0   Live Births  1            Home Medications    Prior to Admission medications   Medication Sig Start Date End Date Taking? Authorizing Provider  fluconazole (DIFLUCAN) 150 MG tablet Take 1 now and 1 in 3 days 08/07/20   Adline Potter, NP  metroNIDAZOLE (FLAGYL) 500 MG tablet Take 1 tablet (500 mg total) by mouth 2 (two) times daily. 08/31/20   LampteyBritta Mccreedy, MD  nitrofurantoin, macrocrystal-monohydrate, (MACROBID) 100 MG capsule Take 1 capsule (100 mg total) by mouth 2 (two) times daily. 08/29/20   LampteyBritta Mccreedy, MD    Family History Family History  Problem Relation Age of Onset  . Ovarian cysts Mother   . Endometriosis Mother   . Fibromyalgia Mother   . Hepatitis C Mother   .  Diabetes Mother   . Cancer Maternal Aunt        ovarian  . Cervical cancer Maternal Aunt   . Cancer Maternal Grandfather        bladder  . Cancer Paternal Grandmother        lung  . Bipolar disorder Father   . Suicidality Paternal Grandfather   . Cancer Maternal Grandmother   . Other Sister        mood disorder; patella femoral syndrome    Social History Social History   Tobacco Use  . Smoking status: Passive Smoke Exposure - Never Smoker  . Smokeless tobacco: Never Used  Vaping Use  . Vaping Use: Never used  Substance Use Topics  . Alcohol use: Yes    Comment: socially  . Drug use: Yes    Frequency: 7.0 times per week    Types: Marijuana     Allergies   Patient has no known allergies.   Review of Systems Review of Systems  Constitutional: Negative.    Genitourinary: Positive for dysuria, frequency and vaginal discharge. Negative for pelvic pain and urgency.  Musculoskeletal: Negative.   Neurological: Negative.      Physical Exam Triage Vital Signs ED Triage Vitals  Enc Vitals Group     BP 08/29/20 1641 110/60     Pulse Rate 08/29/20 1641 85     Resp 08/29/20 1641 20     Temp 08/29/20 1641 98.9 F (37.2 C)     Temp src --      SpO2 08/29/20 1641 98 %     Weight --      Height --      Head Circumference --      Peak Flow --      Pain Score 08/29/20 1640 0     Pain Loc --      Pain Edu? --      Excl. in GC? --    No data found.  Updated Vital Signs BP 110/60   Pulse 85   Temp 98.9 F (37.2 C)   Resp 20   LMP 08/26/2020   SpO2 98%   Visual Acuity Right Eye Distance:   Left Eye Distance:   Bilateral Distance:    Right Eye Near:   Left Eye Near:    Bilateral Near:     Physical Exam Vitals and nursing note reviewed.  Cardiovascular:     Rate and Rhythm: Normal rate and regular rhythm.     Pulses: Normal pulses.     Heart sounds: Normal heart sounds.  Abdominal:     General: Bowel sounds are normal.     Palpations: Abdomen is soft.  Musculoskeletal:        General: Normal range of motion.  Skin:    General: Skin is warm and dry.     Capillary Refill: Capillary refill takes less than 2 seconds.      UC Treatments / Results  Labs (all labs ordered are listed, but only abnormal results are displayed) Labs Reviewed  URINE CULTURE - Abnormal; Notable for the following components:      Result Value   Culture >=100,000 COLONIES/mL ESCHERICHIA COLI (*)    Organism ID, Bacteria ESCHERICHIA COLI (*)    All other components within normal limits  POCT URINALYSIS DIP (MANUAL ENTRY) - Abnormal; Notable for the following components:   Clarity, UA cloudy (*)    Blood, UA large (*)    Nitrite, UA Positive (*)    Leukocytes,  UA Small (1+) (*)    All other components within normal limits  CERVICOVAGINAL  ANCILLARY ONLY - Abnormal; Notable for the following components:   Bacterial Vaginitis (gardnerella) Positive (*)    All other components within normal limits  POCT URINE PREGNANCY    EKG   Radiology No results found.  Procedures Procedures (including critical care time)  Medications Ordered in UC Medications - No data to display  Initial Impression / Assessment and Plan / UC Course  I have reviewed the triage vital signs and the nursing notes.  Pertinent labs & imaging results that were available during my care of the patient were reviewed by me and considered in my medical decision making (see chart for details).     1.  Acute cystitis with hematuria: Macrobid 100 mg twice daily for 5 days Point-of-care urinalysis is significant for leukocyte esterase, nitrites and large RBC Urine cultures have been sent. Increase oral fluid intake. Tylenol as needed for fever and/or chills. Final Clinical Impressions(s) / UC Diagnoses   Final diagnoses:  Acute cystitis with hematuria     Discharge Instructions     Please take medications as prescribed We will call you with recommendations once we get the urine cultures results Tylenol as needed for pain or fever.    ED Prescriptions    Medication Sig Dispense Auth. Provider   nitrofurantoin, macrocrystal-monohydrate, (MACROBID) 100 MG capsule Take 1 capsule (100 mg total) by mouth 2 (two) times daily. 10 capsule Kolleen Ochsner, Britta Mccreedy, MD     PDMP not reviewed this encounter.   Merrilee Jansky, MD 09/01/20 1550

## 2020-09-02 ENCOUNTER — Other Ambulatory Visit: Payer: Self-pay

## 2020-09-02 ENCOUNTER — Ambulatory Visit
Admission: EM | Admit: 2020-09-02 | Discharge: 2020-09-02 | Disposition: A | Discharge status: Home / Self Care | Payer: Medicaid Other | Attending: Physician Assistant | Admitting: Physician Assistant

## 2020-09-02 ENCOUNTER — Encounter: Payer: Self-pay | Admitting: Emergency Medicine

## 2020-09-02 DIAGNOSIS — J069 Acute upper respiratory infection, unspecified: Secondary | ICD-10-CM | POA: Diagnosis not present

## 2020-09-02 DIAGNOSIS — Z1152 Encounter for screening for COVID-19: Secondary | ICD-10-CM

## 2020-09-02 NOTE — Discharge Instructions (Signed)
Return if any problems.  Your covid and influenza test are pending

## 2020-09-02 NOTE — ED Triage Notes (Signed)
Cough, headache and nausea that started today

## 2020-09-04 LAB — COVID-19, FLU A+B NAA
Influenza A, NAA: NOT DETECTED
Influenza B, NAA: NOT DETECTED
SARS-CoV-2, NAA: NOT DETECTED

## 2020-09-04 NOTE — ED Provider Notes (Signed)
[facility]    CSN: [id number] Arrival date & time: 09/02/20  1549      History   Chief Complaint No chief complaint on file.   HPI Katelyn Martinez is a 24 y.o. female.   The history is provided by the patient. No language interpreter was used.  Cough Cough characteristics:  Non-productive Sputum characteristics:  Nondescript Severity:  Moderate Onset quality:  Gradual Timing:  Constant Progression:  Worsening Chronicity:  New Smoker: no   Relieved by:  Nothing Worsened by:  Nothing Ineffective treatments:  None tried Associated symptoms: no fever   Risk factors: no recent infection     Past Medical History:  Diagnosis Date  . Asthma   . 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 Nexplanon insertion 04/03/2018   Inserted right arm 04/03/18  . Recurrent upper respiratory infection (URI)   . Trauma 12/22/2014   feel at [facility] and had skull Fracture     Patient Active Problem List   Diagnosis Date Noted  . Encounter for gynecological examination with Papanicolaou smear of cervix 08/03/2020  . Routine cervical smear 08/03/2020  . Encounter for initial prescription of injectable contraceptive 05/15/2020  . Urinary tract infection with hematuria 05/15/2020  . Screening examination for STD (sexually transmitted disease) 05/15/2020  . Hematuria 05/15/2020  . Burning with urination 05/15/2020  . Urine pregnancy test negative 05/15/2020  . Depression 05/15/2020  . COVID-19 virus detected 11/25 08/19/2019  . Tibial plateau fracture, right 12/24/2014  . Fracture of right fibula 12/24/2014  . Back abrasion 12/24/2014  . Lumbar transverse process fracture (HCC) 12/24/2014  . Asthma 12/24/2014  . Anxiety disorder 12/24/2014  . Intracranial hemorrhage (HCC)   . Pneumocephalus, traumatic   . Pulmonary contusion   . Temporal bone fracture (HCC)   . Trauma   . Basilar skull fracture (HCC) 12/23/2014  . Fall by pediatric patient     Past Surgical  History:  Procedure Laterality Date  . BLADDER SURGERY     uteral reimplantation  . ORIF TIBIA PLATEAU Right 12/29/2014   Procedure: OPEN REDUCTION INTERNAL FIXATION (ORIF) TIBIAL PLATEAU;  Surgeon: Sheral Apley, MD;  Location: [facility] OR;  Service: Orthopedics;  Laterality: Right;  . SALIVARY GLAND SURGERY    . TONSILLECTOMY      OB History    Gravida  1   Para  1   Term  1   Preterm      AB      Living  1     SAB      IAB      Ectopic      Multiple  0   Live Births  1            Home Medications    Prior to Admission medications   Medication Sig Start Date End Date Taking? Authorizing Provider  fluconazole (DIFLUCAN) 150 MG tablet Take 1 now and 1 in 3 days 08/07/20   Adline Potter, NP  metroNIDAZOLE (FLAGYL) 500 MG tablet Take 1 tablet (500 mg total) by mouth 2 (two) times daily. 08/31/20   LampteyBritta Mccreedy, MD  nitrofurantoin, macrocrystal-monohydrate, (MACROBID) 100 MG capsule Take 1 capsule (100 mg total) by mouth 2 (two) times daily. 08/29/20   LampteyBritta Mccreedy, MD    Family History Family History  Problem Relation Age of Onset  . Ovarian cysts Mother   . Endometriosis Mother   . Fibromyalgia Mother   . Hepatitis C  Mother   . Diabetes Mother   . Cancer Maternal Aunt        ovarian  . Cervical cancer Maternal Aunt   . Cancer Maternal Grandfather        bladder  . Cancer Paternal Grandmother        lung  . Bipolar disorder Father   . Suicidality Paternal Grandfather   . Cancer Maternal Grandmother   . Other Sister        mood disorder; patella femoral syndrome    Social History Social History   Tobacco Use  . Smoking status: Passive Smoke Exposure - Never Smoker  . Smokeless tobacco: Never Used  Vaping Use  . Vaping Use: Never used  Substance Use Topics  . Alcohol use: Yes    Comment: socially  . Drug use: Yes    Frequency: 7.0 times per week    Types: Marijuana     Allergies   Patient has no known  allergies.   Review of Systems Review of Systems  Constitutional: Negative for fever.  Respiratory: Positive for cough.   All other systems reviewed and are negative.    Physical Exam Triage Vital Signs ED Triage Vitals  Enc Vitals Group     BP 09/02/20 1642 94/67     Pulse Rate 09/02/20 1642 (!) 109     Resp 09/02/20 1642 16     Temp 09/02/20 1642 98.2 F (36.8 C)     Temp Source 09/02/20 1642 Oral     SpO2 09/02/20 1642 97 %     Weight --      Height --      Head Circumference --      Peak Flow --      Pain Score 09/02/20 1644 0     Pain Loc --      Pain Edu? --      Excl. in GC? --    No data found.  Updated Vital Signs BP 94/67 (BP Location: Right Arm)   Pulse (!) 109   Temp 98.2 F (36.8 C) (Oral)   Resp 16   LMP 08/26/2020   SpO2 97%   Visual Acuity Right Eye Distance:   Left Eye Distance:   Bilateral Distance:    Right Eye Near:   Left Eye Near:    Bilateral Near:     Physical Exam Vitals and nursing note reviewed.  Constitutional:      Appearance: She is well-developed and well-nourished.  HENT:     Head: Normocephalic.  Eyes:     Extraocular Movements: EOM normal.  Pulmonary:     Effort: Pulmonary effort is normal.  Abdominal:     General: There is no distension.  Musculoskeletal:        General: Normal range of motion.     Cervical back: Normal range of motion.  Neurological:     Mental Status: She is alert and oriented to person, place, and time.  Psychiatric:        Mood and Affect: Mood and affect normal.      UC Treatments / Results  Labs (all labs ordered are listed, but only abnormal results are displayed) Labs Reviewed  COVID-19, FLU A+B NAA   Narrative:    Test(s) 140142-Influenza A, NAA; 140143-Influenza B, NAA was developed and its performance characteristics determined by [facility]. It has not been cleared or approved by the Food and Drug Administration. Performed at:  [address] - [facility] [address]  [place], [place]  [id number] Lab Director: Jolene Schimke MD, Phone:  [phone]    EKG   Radiology No results found.  Procedures Procedures (including critical care time)  Medications Ordered in UC Medications - No data to display  Initial Impression / Assessment and Plan / UC Course  I have reviewed the triage vital signs and the nursing notes.  Pertinent labs & imaging results that were available during my care of the patient were reviewed by me and considered in my medical decision making (see chart for details).     MDM:  I suspect viral uri. Covid and Influenza ordered.  Pt counseled on symptomatic care.  Final Clinical Impressions(s) / UC Diagnoses   Final diagnoses:  Viral URI  Encounter for screening for COVID-19     Discharge Instructions     Return if any problems.  Your covid and influenza test are pending   ED Prescriptions    None     PDMP not reviewed this encounter.  An After Visit Summary was printed and given to the patient.    Elson Areas, [place] 09/04/20 1030

## 2020-09-19 NOTE — L&D Delivery Note (Signed)
OB/GYN Faculty Practice Delivery Note  Katelyn Martinez is a 25 y.o. [id number] s/p SVD at [redacted]w[redacted]d. She was admitted for SROM/SOL.   ROM: 19h 52m with clear fluid GBS Status: Negative  Delivery Date/Time: 08/01/21 at 2104  Delivery: Called to room and patient was complete and pushing. Head delivered direct OA. Loose nuchal cord present and delivered through. Shoulder and body delivered in usual fashion. Cord reduced after delivery. Infant with spontaneous cry, placed on mother's abdomen, dried and stimulated. Cord clamped x 2 after 1-minute delay and cut by family member under my direct supervision. Cord blood drawn. Placenta delivered spontaneously with gentle cord traction. Fundus firm with massage and Pitocin. Labia, perineum, vagina, and cervix were inspected, and no lacerations were noted.   Placenta: Intact; 3VC - sent to L&D Complications: None Lacerations: None EBL: 50 cc Analgesia: Epidural   Infant: Viable female  APGARs 8 and 31  Evalina Field, MD OB/GYN Fellow, Faculty Practice

## 2020-09-22 ENCOUNTER — Telehealth: Payer: Self-pay | Admitting: Adult Health

## 2020-09-22 MED ORDER — FLUCONAZOLE 150 MG PO TABS: ORAL_TABLET | ORAL | 1 refills | Status: DC

## 2020-09-22 MED ORDER — METRONIDAZOLE 500 MG PO TABS: 500.0000 mg | ORAL_TABLET | Freq: Two times a day (BID) | ORAL | 0 refills | Status: DC

## 2020-09-22 NOTE — Telephone Encounter (Signed)
Pt recently treated for bv and uti by urgent care. States that the uti is gone but feels like bv is still there and some itching like yeast as well. She wanted to see if we can retreat for the BV and send in diflucan to use after. Advised patient I would ask provider and she could check with pharmacy later today if she doesn't hear from me. Also advised if sx don't improve she will need a visit.

## 2020-09-22 NOTE — Telephone Encounter (Signed)
Pt seen at Urgent Care 09/02/2020, treated for BV & UTI Pt states she don't think it's cleared up & now possibly has yeast infection  Please advise & notify pt      [facility]

## 2020-09-22 NOTE — Telephone Encounter (Signed)
Will rx flagyl and diflucan 

## 2020-09-26 ENCOUNTER — Ambulatory Visit
Admission: EM | Admit: 2020-09-26 | Discharge: 2020-09-26 | Disposition: A | Discharge status: Home / Self Care | Payer: Medicaid Other | Attending: Family Medicine | Admitting: Family Medicine

## 2020-09-26 ENCOUNTER — Encounter: Payer: Self-pay | Admitting: Emergency Medicine

## 2020-09-26 ENCOUNTER — Other Ambulatory Visit: Payer: Self-pay

## 2020-09-26 ENCOUNTER — Ambulatory Visit: Admit: 2020-09-26 | Discharge status: Absent without leave | Payer: Medicaid Other

## 2020-09-26 DIAGNOSIS — R519 Headache, unspecified: Secondary | ICD-10-CM

## 2020-09-26 DIAGNOSIS — B349 Viral infection, unspecified: Secondary | ICD-10-CM

## 2020-09-26 DIAGNOSIS — R5383 Other fatigue: Secondary | ICD-10-CM

## 2020-09-26 DIAGNOSIS — R0789 Other chest pain: Secondary | ICD-10-CM | POA: Diagnosis not present

## 2020-09-26 DIAGNOSIS — R059 Cough, unspecified: Secondary | ICD-10-CM | POA: Diagnosis not present

## 2020-09-26 DIAGNOSIS — B009 Herpesviral infection, unspecified: Secondary | ICD-10-CM

## 2020-09-26 MED ORDER — PROMETHAZINE-DM 6.25-15 MG/5ML PO SYRP: 5.0000 mL | ORAL_SOLUTION | Freq: Four times a day (QID) | ORAL | 0 refills | Status: DC | PRN

## 2020-09-26 MED ORDER — VALACYCLOVIR HCL 1 G PO TABS: 1000.0000 mg | ORAL_TABLET | Freq: Three times a day (TID) | ORAL | 0 refills | Status: AC

## 2020-09-26 NOTE — Discharge Instructions (Addendum)
I have sent in cough syrup for you to take. This medication can make you sleepy. Do not drive while taking this medication.  I have sent in Valtrex for you to use three times a day for 7 days  Your COVID and Flu tests are pending.  You should self quarantine until the test results are back.    Take Tylenol or ibuprofen as needed for fever or discomfort.  Rest and keep yourself hydrated.    Follow-up with your primary care provider if your symptoms are not improving.

## 2020-09-26 NOTE — ED Triage Notes (Signed)
fatigue for a few weeks, cough, headache, runny nose.  States she got Covid vaccine 1 week ago

## 2020-09-28 NOTE — ED Provider Notes (Signed)
[facility]   [id number] 09/26/20 Arrival Time: 1423   CC: COVID symptoms  SUBJECTIVE: History from: patient.  DAWNIELLE CHRISTIANA is a 25 y.o. female who presents with cough, headache, fatigue, rhinorrhea x 1 week. Reports that she feels a cold sore coming on as well. Had Covid vaccine x 1 week ago. Denies sick exposure to COVID, flu or strep. Denies recent travel. Has negative history of Covid. Has not completed Covid vaccines. Has not had flu vaccine this year. Has not taken OTC medications for this. There are no aggravating or alleviating factors. Denies previous symptoms in the past. Denies fever, chills, sinus pain, rhinorrhea, sore throat, SOB, wheezing, chest pain, nausea, changes in bowel or bladder habits.    ROS: As per HPI.  All other pertinent ROS negative.     Past Medical History:  Diagnosis Date  . Asthma   . 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 Nexplanon insertion 04/03/2018   Inserted right arm 04/03/18  . Recurrent upper respiratory infection (URI)   . Trauma 12/22/2014   feel at [facility] and had skull Fracture    Past Surgical History:  Procedure Laterality Date  . BLADDER SURGERY     uteral reimplantation  . ORIF TIBIA PLATEAU Right 12/29/2014   Procedure: OPEN REDUCTION INTERNAL FIXATION (ORIF) TIBIAL PLATEAU;  Surgeon: Sheral Apley, MD;  Location: [facility] OR;  Service: Orthopedics;  Laterality: Right;  . SALIVARY GLAND SURGERY    . TONSILLECTOMY     No Known Allergies No current facility-administered medications on file prior to encounter.   Current Outpatient Medications on File Prior to Encounter  Medication Sig Dispense Refill  . fluconazole (DIFLUCAN) 150 MG tablet Take 1 now and 1 in 3 days 2 tablet 1  . metroNIDAZOLE (FLAGYL) 500 MG tablet Take 1 tablet (500 mg total) by mouth 2 (two) times daily. 14 tablet 0  . nitrofurantoin, macrocrystal-monohydrate, (MACROBID) 100 MG capsule Take 1 capsule (100 mg total) by mouth 2 (two) times daily.  10 capsule 0   Social History   Socioeconomic History  . Marital status: Media planner    Spouse name: Not on file  . Number of children: 1  . Years of education: Not on file  . Highest education level: Not on file  Occupational History  . Not on file  Tobacco Use  . Smoking status: Passive Smoke Exposure - Never Smoker  . Smokeless tobacco: Never Used  Vaping Use  . Vaping Use: Never used  Substance and Sexual Activity  . Alcohol use: Yes    Comment: socially  . Drug use: Yes    Frequency: 7.0 times per week    Types: Marijuana  . Sexual activity: Yes    Birth control/protection: None    Comment: over s month  Other Topics Concern  . Not on file  Social History Narrative  . Not on file   Social Determinants of Health   Financial Resource Strain: Low Risk   . Difficulty of Paying Living Expenses: Not hard at all  Food Insecurity: Food Insecurity Present  . Worried About Programme researcher, broadcasting/film/video in the Last Year: Sometimes true  . Ran Out of Food in the Last Year: Never true  Transportation Needs: No Transportation Needs  . Lack of Transportation (Medical): No  . Lack of Transportation (Non-Medical): No  Physical Activity: Inactive  . Days of Exercise per Week: 0 days  . Minutes of Exercise per Session: 0 min  Stress: Stress Concern Present  . Feeling of Stress : To some extent  Social Connections: Moderately Isolated  . Frequency of Communication with Friends and Family: Three times a week  . Frequency of Social Gatherings with Friends and Family: Three times a week  . Attends Religious Services: Never  . Active Member of Clubs or Organizations: No  . Attends Banker Meetings: Never  . Marital Status: Living with partner  Intimate Partner Violence: Not At Risk  . Fear of Current or Ex-Partner: No  . Emotionally Abused: No  . Physically Abused: No  . Sexually Abused: No   Family History  Problem Relation Age of Onset  . Ovarian cysts Mother   .  Endometriosis Mother   . Fibromyalgia Mother   . Hepatitis C Mother   . Diabetes Mother   . Cancer Maternal Aunt        ovarian  . Cervical cancer Maternal Aunt   . Cancer Maternal Grandfather        bladder  . Cancer Paternal Grandmother        lung  . Bipolar disorder Father   . Suicidality Paternal Grandfather   . Cancer Maternal Grandmother   . Other Sister        mood disorder; patella femoral syndrome    OBJECTIVE:  Vitals:   09/26/20 1446  BP: 107/74  Pulse: 99  Resp: 18  Temp: 98 F (36.7 C)  TempSrc: Oral  SpO2: 98%  Weight: 115 lb (52.2 kg)  Height: 5\' 5"  (1.651 m)     General appearance: alert; appears fatigued, but nontoxic; speaking in full sentences and tolerating own secretions HEENT: NCAT; Ears: EACs clear, TMs pearly gray; Eyes: PERRL.  EOM grossly intact. Sinuses: nontender; Nose: nares patent with clear rhinorrhea, Throat: oropharynx erythematous, cobblestoning present, tonsils non erythematous or enlarged, uvula midline  Neck: supple without LAD Lungs: unlabored respirations, symmetrical air entry; cough: absent; no respiratory distress; CTAB Heart: regular rate and rhythm.  Radial pulses 2+ symmetrical bilaterally Skin: warm and dry Psychological: alert and cooperative; normal mood and affect  LABS:  No results found for this or any previous visit (from the past 24 hour(s)).   ASSESSMENT & PLAN:  1. Viral illness   2. Cough   3. Herpes simplex   4. Other fatigue   5. Nonintractable headache, unspecified chronicity pattern, unspecified headache type   6. Chest discomfort     Meds ordered this encounter  Medications  . valACYclovir (VALTREX) 1000 MG tablet    Sig: Take 1 tablet (1,000 mg total) by mouth 3 (three) times daily for 7 days.    Dispense:  21 tablet    Refill:  0    Order Specific Question:   Supervising Provider    Answer:   Merrilee Jansky  . promethazine-dextromethorphan (PROMETHAZINE-DM) 6.25-15 MG/5ML syrup     Sig: Take 5 mLs by mouth 4 (four) times daily as needed for cough.    Dispense:  118 mL    Refill:  0    Order Specific Question:   Supervising Provider    Answer:   11-21-1989 Merrilee Jansky   Prescribed Valtrex TID x 7 days Promethazine cough syrup prescribed Sedation precautions given Continue supportive care at home COVID and flu testing ordered.  It will take between 2-3 days for test results. Someone will contact you regarding abnormal results.   Work note provided Patient should remain in quarantine until they have received Covid results.  If negative you may resume normal activities (go back to work/school) while practicing hand hygiene, social distance, and mask wearing.  If positive, patient should remain in quarantine for at least 5 days from symptom onset AND greater than 72 hours after symptoms resolution (absence of fever without the use of fever-reducing medication and improvement in respiratory symptoms), whichever is longer Get plenty of rest and push fluids Use OTC zyrtec for nasal congestion, runny nose, and/or sore throat Use OTC flonase for nasal congestion and runny nose Use medications daily for symptom relief Use OTC medications like ibuprofen or tylenol as needed fever or pain Call or go to the ED if you have any new or worsening symptoms such as fever, worsening cough, shortness of breath, chest tightness, chest pain, turning blue, changes in mental status.  Reviewed expectations re: course of current medical issues. Questions answered. Outlined signs and symptoms indicating need for more acute intervention. Patient verbalized understanding. After Visit Summary given.         Moshe Cipro, NP 09/28/20 2056

## 2020-09-29 LAB — COVID-19, FLU A+B NAA
Influenza A, NAA: NOT DETECTED
Influenza B, NAA: NOT DETECTED
SARS-CoV-2, NAA: NOT DETECTED

## 2020-10-11 DIAGNOSIS — H5213 Myopia, bilateral: Secondary | ICD-10-CM | POA: Diagnosis not present

## 2020-10-13 ENCOUNTER — Telehealth: Payer: Self-pay | Admitting: Adult Health

## 2020-10-13 MED ORDER — ESCITALOPRAM OXALATE 10 MG PO TABS: 10.0000 mg | ORAL_TABLET | Freq: Every day | ORAL | 2 refills | Status: DC

## 2020-10-13 NOTE — Telephone Encounter (Signed)
Will rx lexapro ?

## 2020-10-13 NOTE — Telephone Encounter (Signed)
Left message letting pt know can check with pharmacy later on today. JSY

## 2020-10-13 NOTE — Telephone Encounter (Signed)
Pt thought it over & decided she'd like to try the depression meds that Victorino Dike offered to order at her last visit  [facility]  Please advise & notify pt

## 2020-10-14 ENCOUNTER — Other Ambulatory Visit: Payer: Self-pay | Admitting: Advanced Practice Midwife

## 2020-10-14 ENCOUNTER — Other Ambulatory Visit (HOSPITAL_COMMUNITY)
Admission: RE | Admit: 2020-10-14 | Discharge: 2020-10-14 | Disposition: A | Discharge status: Home / Self Care | Payer: Medicaid Other | Source: Ambulatory Visit | Attending: Advanced Practice Midwife | Admitting: Advanced Practice Midwife

## 2020-10-14 ENCOUNTER — Other Ambulatory Visit: Payer: Self-pay

## 2020-10-14 ENCOUNTER — Encounter: Payer: Self-pay | Admitting: Advanced Practice Midwife

## 2020-10-14 ENCOUNTER — Ambulatory Visit: Payer: Medicaid Other | Admitting: Advanced Practice Midwife

## 2020-10-14 VITALS — BP 119/67 | HR 88 | Ht 65.0 in | Wt 115.0 lb

## 2020-10-14 DIAGNOSIS — B373 Candidiasis of vulva and vagina: Secondary | ICD-10-CM | POA: Diagnosis not present

## 2020-10-14 DIAGNOSIS — B3731 Acute candidiasis of vulva and vagina: Secondary | ICD-10-CM

## 2020-10-14 MED ORDER — TERCONAZOLE 0.4 % VA CREA: 1.0000 | TOPICAL_CREAM | Freq: Every day | VAGINAL | 0 refills | Status: DC

## 2020-10-14 NOTE — Progress Notes (Signed)
   GYN VISIT Patient name: Katelyn Martinez MRN [id number]  Date of birth: 01-Mar-1996 Chief Complaint:   Vaginal Discharge  History of Present Illness:   Katelyn Martinez is a 25 y.o. G44P1001 Caucasian female being seen today for vag itching/thick discharge that is becoming worse; she was tx for BV earlier in Jan and also took a dose of Diflucan during the Flagyl tx and so she thinks it wasn't effective.     Depression screen [facility] 2/9 08/03/2020 05/15/2020 12/15/2016  Decreased Interest 1 3 0  Down, Depressed, Hopeless 1 1 0  PHQ - 2 Score 2 4 0  Altered sleeping 1 3 2   Tired, decreased energy 1 3 1   Change in appetite 1 0 0  Feeling bad or failure about yourself  1 0 0  Trouble concentrating 1 0 0  Moving slowly or fidgety/restless 1 0 0  Suicidal thoughts 0 0 0  PHQ-9 Score 8 10 3   Difficult doing work/chores - Not difficult at all -    Patient's last menstrual period was 09/30/2020 (approximate). The current method of family planning is none.(stopped DMPA)  Last pap Nov 2021. Results were: NILM w/ HRHPV not done Review of Systems:   Pertinent items are noted in HPI Denies fever/chills, dizziness, headaches, visual disturbances, fatigue, shortness of breath, chest pain, abdominal pain, vomiting, abnormal vaginal discharge/itching/odor/irritation, problems with periods, bowel movements, urination, or intercourse unless otherwise stated above.  Pertinent History Reviewed:  Reviewed past medical,surgical, social, obstetrical and family history.  Reviewed problem list, medications and allergies. Physical Assessment:   Vitals:   10/14/20 1430  BP: 119/67  Pulse: 88  Weight: 115 lb (52.2 kg)  Height: 5\' 5"  (1.651 m)  Body mass index is 19.14 kg/m.       Physical Examination:   General appearance: alert, well appearing, and in no distress  Mental status: alert, oriented to person, place, and time  Skin: warm & dry   Cardiovascular: normal heart rate noted  Respiratory: normal  respiratory effort, no distress  Abdomen: soft, non-tender   Pelvic: normal external genitalia, vulva, vagina, cervix, uterus and adnexa, VULVA: normal appearing vulva with no masses, tenderness or lesions, vulvar erythema somewhat obscured by baby powder, VAGINA: vaginal discharge - white, thick and vaginal erythema noted  Extremities: no edema   Chaperone: Angel Neas    No results found for this or any previous visit (from the past 24 hour(s)).  Assessment & Plan:  1) VVC> CV swab collected for yeast & BV, but will tx presumptively with Terazol 7 due to pt's discomfort   Meds:  Meds ordered this encounter  Medications  . terconazole (TERAZOL 7) 0.4 % vaginal cream    Sig: Place 1 applicator vaginally at bedtime.    Dispense:  45 g    Refill:  0    Order Specific Question:   Supervising Provider    Answer:   11/28/2020, LUTHER H [[id number]]    No orders of the defined types were placed in this encounter.   Return for as needed.  Dec 2021 CNM 10/14/2020 2:59 PM

## 2020-10-14 NOTE — Patient Instructions (Signed)
Vaginal Yeast Infection, Adult  Vaginal yeast infection is a condition that causes vaginal discharge as well as soreness, swelling, and redness (inflammation) of the vagina. This is a common condition. Some women get this infection frequently. What are the causes? This condition is caused by a change in the normal balance of the yeast (candida) and bacteria that live in the vagina. This change causes an overgrowth of yeast, which causes the inflammation. What increases the risk? The condition is more likely to develop in women who:  Take antibiotic medicines.  Have diabetes.  Take birth control pills.  Are pregnant.  Douche often.  Have a weak body defense system (immune system).  Have been taking steroid medicines for a long time.  Frequently wear tight clothing. What are the signs or symptoms? Symptoms of this condition include:  White, thick, creamy vaginal discharge.  Swelling, itching, redness, and irritation of the vagina. The lips of the vagina (vulva) may be affected as well.  Pain or a burning feeling while urinating.  Pain during sex. How is this diagnosed? This condition is diagnosed based on:  Your medical history.  A physical exam.  A pelvic exam. Your health care provider will examine a sample of your vaginal discharge under a microscope. Your health care provider may send this sample for testing to confirm the diagnosis. How is this treated? This condition is treated with medicine. Medicines may be over-the-counter or prescription. You may be told to use one or more of the following:  Medicine that is taken by mouth (orally).  Medicine that is applied as a cream (topically).  Medicine that is inserted directly into the vagina (suppository). Follow these instructions at home: Lifestyle  Do not have sex until your health care provider approves. Tell your sex partner that you have a yeast infection. That person should go to his or her health care  provider and ask if they should also be treated.  Do not wear tight clothes, such as pantyhose or tight pants.  Wear breathable cotton underwear. General instructions  Take or apply over-the-counter and prescription medicines only as told by your health care provider.  Eat more yogurt. This may help to keep your yeast infection from returning.  Do not use tampons until your health care provider approves.  Try taking a sitz bath to help with discomfort. This is a warm water bath that is taken while you are sitting down. The water should only come up to your hips and should cover your buttocks. Do this 3-4 times per day or as told by your health care provider.  Do not douche.  If you have diabetes, keep your blood sugar levels under control.  Keep all follow-up visits as told by your health care provider. This is important.   Contact a health care provider if:  You have a fever.  Your symptoms go away and then return.  Your symptoms do not get better with treatment.  Your symptoms get worse.  You have new symptoms.  You develop blisters in or around your vagina.  You have blood coming from your vagina and it is not your menstrual period.  You develop pain in your abdomen. Summary  Vaginal yeast infection is a condition that causes discharge as well as soreness, swelling, and redness (inflammation) of the vagina.  This condition is treated with medicine. Medicines may be over-the-counter or prescription.  Take or apply over-the-counter and prescription medicines only as told by your health care provider.  Do not   douche. Do not have sex or use tampons until your health care provider approves.  Contact a health care provider if your symptoms do not get better with treatment or your symptoms go away and then return. This information is not intended to replace advice given to you by your health care provider. Make sure you discuss any questions you have with your health care  provider. Document Revised: 04/05/2019 Document Reviewed: 01/22/2018 Elsevier Patient Education  2021 Elsevier Inc.  

## 2020-10-15 ENCOUNTER — Encounter: Payer: Self-pay | Admitting: Emergency Medicine

## 2020-10-15 ENCOUNTER — Other Ambulatory Visit: Payer: Self-pay

## 2020-10-15 ENCOUNTER — Ambulatory Visit
Admission: EM | Admit: 2020-10-15 | Discharge: 2020-10-15 | Disposition: A | Discharge status: Home / Self Care | Payer: Medicaid Other | Attending: Emergency Medicine | Admitting: Emergency Medicine

## 2020-10-15 ENCOUNTER — Ambulatory Visit (INDEPENDENT_AMBULATORY_CARE_PROVIDER_SITE_OTHER): Payer: Medicaid Other

## 2020-10-15 DIAGNOSIS — S20212A Contusion of left front wall of thorax, initial encounter: Secondary | ICD-10-CM

## 2020-10-15 DIAGNOSIS — R0781 Pleurodynia: Secondary | ICD-10-CM | POA: Diagnosis not present

## 2020-10-15 DIAGNOSIS — R079 Chest pain, unspecified: Secondary | ICD-10-CM | POA: Diagnosis not present

## 2020-10-15 MED ORDER — TIZANIDINE HCL 4 MG PO TABS: 4.0000 mg | ORAL_TABLET | Freq: Three times a day (TID) | ORAL | 0 refills | Status: DC | PRN

## 2020-10-15 NOTE — ED Provider Notes (Signed)
HPI  SUBJECTIVE:  Katelyn Martinez is a 25 y.o. female who presents with constant, sharp left anterior rib pain after having a slip and fall on some ice 1 week ago.  States that she landed on her bent arm.  She reports a cough since the fall,  shortness of breath secondary to the pain chest wall, and bruising.  No hemoptysis.  She tried Tylenol 1000 mg every 6 hours with some improvement in her symptoms.  Symptoms are worse with coughing, using her abdominal muscles, palpation.  Past medical history of asthma, COVID.  No history of osteoporosis.  LMP: Last week.  Denies the possibility of being pregnant.  PMD: None.    Past Medical History:  Diagnosis Date  . Asthma   . 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 Nexplanon insertion 04/03/2018   Inserted right arm 04/03/18  . Recurrent upper respiratory infection (URI)   . Trauma 12/22/2014   feel at [facility] and had skull Fracture     Past Surgical History:  Procedure Laterality Date  . BLADDER SURGERY     uteral reimplantation  . ORIF TIBIA PLATEAU Right 12/29/2014   Procedure: OPEN REDUCTION INTERNAL FIXATION (ORIF) TIBIAL PLATEAU;  Surgeon: Sheral Apley, MD;  Location: [facility] OR;  Service: Orthopedics;  Laterality: Right;  . SALIVARY GLAND SURGERY    . TONSILLECTOMY      Family History  Problem Relation Age of Onset  . Ovarian cysts Mother   . Endometriosis Mother   . Fibromyalgia Mother   . Hepatitis C Mother   . Diabetes Mother   . Cancer Maternal Aunt        ovarian  . Cervical cancer Maternal Aunt   . Cancer Maternal Grandfather        bladder  . Cancer Paternal Grandmother        lung  . Bipolar disorder Father   . Suicidality Paternal Grandfather   . Cancer Maternal Grandmother   . Other Sister        mood disorder; patella femoral syndrome    Social History   Tobacco Use  . Smoking status: Passive Smoke Exposure - Never Smoker  . Smokeless tobacco: Never Used  Vaping Use  . Vaping Use: Never used   Substance Use Topics  . Alcohol use: Yes    Comment: socially  . Drug use: Yes    Frequency: 7.0 times per week    Types: Marijuana    No current facility-administered medications for this encounter.  Current Outpatient Medications:  .  tiZANidine (ZANAFLEX) 4 MG tablet, Take 1 tablet (4 mg total) by mouth every 8 (eight) hours as needed for muscle spasms., Disp: 30 tablet, Rfl: 0 .  escitalopram (LEXAPRO) 10 MG tablet, Take 1 tablet (10 mg total) by mouth daily. (Patient not taking: Reported on 10/14/2020), Disp: 30 tablet, Rfl: 2 .  terconazole (TERAZOL 7) 0.4 % vaginal cream, PLACE 1 APPLICATOR VAGINALLY AT BEDTIME., Disp: 45 g, Rfl: 0  No Known Allergies   ROS  As noted in HPI.   Physical Exam  BP 112/78 (BP Location: Right Arm)   Pulse (!) 102   Temp 98.9 F (37.2 C) (Oral)   Resp 16   Ht 5\' 5"  (1.651 m)   Wt 52.2 kg   LMP 09/30/2020 (Approximate)   SpO2 97%   BMI 19.14 kg/m   Constitutional: Well developed, well nourished, no acute distress Eyes:  EOMI, conjunctiva normal bilaterally HENT: Normocephalic, atraumatic,mucus membranes  moist Respiratory: Normal inspiratory effort, lungs clear bilaterally.  Positive anterior lower rib tenderness.  No crepitus.  No bruising. Cardiovascular: Mild regular tachycardia no murmurs rubs or gallops. GI: nondistended skin: No rash, skin intact Musculoskeletal: No other rib cage tenderness.  No T-spine tenderness. Neurologic: Alert & oriented x 3, no focal neuro deficits Psychiatric: Speech and behavior appropriate   ED Course   Medications - No data to display  Orders Placed This Encounter  Procedures  . DG Ribs Unilateral W/Chest Left    Standing Status:   Standing    Number of Occurrences:   1    Order Specific Question:   Reason for Exam (SYMPTOM  OR DIAGNOSIS REQUIRED)    Answer:   fall, pain    Order Specific Question:   Is patient pregnant?    Answer:   No    No results found for this or any previous  visit (from the past 24 hour(s)). DG Ribs Unilateral W/Chest Left  Result Date: 10/15/2020 CLINICAL DATA:  Fall 1 week ago with persistent left-sided chest pain EXAM: LEFT RIBS AND CHEST - 3+ VIEW COMPARISON:  12/23/2014 FINDINGS: Cardiac shadow is within normal limits. The lungs are well aerated bilaterally. No focal infiltrate or effusion is noted. Bony structures are within normal limits. No rib fracture is noted. Nipple piercings are seen. IMPRESSION: No rib fracture noted. Electronically Signed   By: Alcide Clever M.D.   On: 10/15/2020 16:58    ED Clinical Impression  1. Contusion of rib on left side, initial encounter      ED Assessment/Plan  Reviewed imaging independently.  No rib fracture.  see radiology report for full details.  Suspect rib/bone contusion.  Will send home with Tylenol 1000 mg combined with 400 mg of ibuprofen 3-4 times a day as needed for pain, Zanaflex.  Work note for today.  Primary care list.  Discussed  imaging, MDM, treatment plan, and plan for follow-up with patient. Discussed sn/sx that should prompt return to the ED. patient agrees with plan.   Meds ordered this encounter  Medications  . tiZANidine (ZANAFLEX) 4 MG tablet    Sig: Take 1 tablet (4 mg total) by mouth every 8 (eight) hours as needed for muscle spasms.    Dispense:  30 tablet    Refill:  0    *This clinic note was created using Scientist, clinical (histocompatibility and immunogenetics). Therefore, there may be occasional mistakes despite careful proofreading.   ?    Domenick Gong, MD 10/16/20 [phone]

## 2020-10-15 NOTE — Discharge Instructions (Addendum)
Take Tylenol 1000 mg combined with 400 mg of ibuprofen 3-4 times a day as needed for pain, Zanaflex for muscle spasms.  This will take several weeks to heal.

## 2020-10-15 NOTE — ED Triage Notes (Signed)
Left side pain from fall last week.  Received covid vaccine yesterday and states she does not feel right.

## 2020-10-16 LAB — CERVICOVAGINAL ANCILLARY ONLY
Bacterial Vaginitis (gardnerella): NEGATIVE
Candida Glabrata: NEGATIVE
Candida Vaginitis: NEGATIVE
Comment: NEGATIVE
Comment: NEGATIVE
Comment: NEGATIVE

## 2020-12-02 ENCOUNTER — Other Ambulatory Visit: Payer: Self-pay

## 2020-12-02 ENCOUNTER — Ambulatory Visit (INDEPENDENT_AMBULATORY_CARE_PROVIDER_SITE_OTHER): Payer: Medicaid Other | Admitting: *Deleted

## 2020-12-02 ENCOUNTER — Other Ambulatory Visit (HOSPITAL_COMMUNITY)
Admission: RE | Admit: 2020-12-02 | Discharge: 2020-12-02 | Disposition: A | Discharge status: Home / Self Care | Payer: Medicaid Other | Source: Ambulatory Visit | Attending: Obstetrics & Gynecology | Admitting: Obstetrics & Gynecology

## 2020-12-02 VITALS — BP 113/70 | HR 77

## 2020-12-02 DIAGNOSIS — N926 Irregular menstruation, unspecified: Secondary | ICD-10-CM

## 2020-12-02 DIAGNOSIS — N898 Other specified noninflammatory disorders of vagina: Secondary | ICD-10-CM | POA: Diagnosis not present

## 2020-12-02 LAB — POCT URINALYSIS DIPSTICK OB
Blood, UA: NEGATIVE
Glucose, UA: NEGATIVE
Ketones, UA: NEGATIVE
Leukocytes, UA: NEGATIVE
Nitrite, UA: NEGATIVE
POC,PROTEIN,UA: NEGATIVE

## 2020-12-02 LAB — POCT URINE PREGNANCY: Preg Test, Ur: POSITIVE — AB

## 2020-12-02 NOTE — Progress Notes (Signed)
Chart reviewed for nurse visit. Agree with plan of care.  Adline Potter, NP 12/02/2020 1:29 PM

## 2020-12-02 NOTE — Progress Notes (Signed)
   NURSE VISIT- PREGNANCY CONFIRMATION   SUBJECTIVE:  Katelyn Martinez is a 25 y.o. G2P1001 female at [redacted]w[redacted]d by certain LMP of Patient's last menstrual period was 11/02/2020 (within days). Here for pregnancy confirmation.  Home pregnancy test: positive x 1  She reports no complaints.  She is taking prenatal vitamins.    OBJECTIVE:  BP 113/70 (BP Location: Left Arm, Patient Position: Sitting, Cuff Size: Normal)   Pulse 77   LMP 11/02/2020 (Within Days)   Appears well, in no apparent distress OB History  Gravida Para Term Preterm AB Living  2 1 1     1   SAB IAB Ectopic Multiple Live Births        0 1    # Outcome Date GA Lbr Len/2nd Weight Sex Delivery Anes PTL Lv  2 Current           1 Term 06/19/17 [redacted]w[redacted]d 04:41 / 00:21 5 lb 12.2 oz (2.614 kg) M Vag-Spont EPI  LIV    Results for orders placed or performed in visit on 12/02/20 (from the past 24 hour(s))  POCT urine pregnancy   Collection Time: 12/02/20  9:26 AM  Result Value Ref Range   Preg Test, Ur Positive (A) Negative  POC Urinalysis Dipstick OB   Collection Time: 12/02/20  9:27 AM  Result Value Ref Range   Color, UA     Clarity, UA     Glucose, UA Negative Negative   Bilirubin, UA     Ketones, UA neg    Spec Grav, UA     Blood, UA neg    pH, UA     POC,PROTEIN,UA Negative Negative, Trace, Small (1+), Moderate (2+), Large (3+), 4+   Urobilinogen, UA     Nitrite, UA neg    Leukocytes, UA Negative Negative   Appearance     Odor      ASSESSMENT: Positive pregnancy test, [redacted]w[redacted]d by LMP    PLAN: Schedule for dating ultrasound in 3 weeks Prenatal vitamins: continue   Nausea medicines: not currently needed   OB packet given: Yes    Pt also did self swab for vaginal discharge and irritation.   [redacted]w[redacted]d  12/02/2020 9:29 AM

## 2020-12-03 LAB — CERVICOVAGINAL ANCILLARY ONLY
Bacterial Vaginitis (gardnerella): NEGATIVE
Candida Glabrata: NEGATIVE
Candida Vaginitis: NEGATIVE
Chlamydia: NEGATIVE
Comment: NEGATIVE
Comment: NEGATIVE
Comment: NEGATIVE
Comment: NEGATIVE
Comment: NEGATIVE
Comment: NORMAL
Neisseria Gonorrhea: NEGATIVE
Trichomonas: NEGATIVE

## 2020-12-17 ENCOUNTER — Telehealth: Payer: Self-pay | Admitting: Obstetrics & Gynecology

## 2020-12-17 NOTE — Telephone Encounter (Signed)
Pt called to check on message because she's not heard from [place]  States dizzy, legs going numb, bad HA  We did confirm pregnancy & pt is scheduled for a dating U/S  Please advise & call pt

## 2020-12-17 NOTE — Telephone Encounter (Signed)
Pt states for the past few days she has had a bad headache, feels dizzy and her legs feel tired and numb. She has tried tylenol but not really helping. Spoke with Dr. Despina Hidden he stated that symptoms not likely related to pregnancy. I recommended pushing fluids and taking extra strength tylenol. Also patient states that she does suffer from seasonal allergies. I advised taking claritin or zyrtec to help with this.

## 2020-12-17 NOTE — Telephone Encounter (Signed)
Patient stated that she" feels dizzy and legs feel like they're going to give out..Just feels weird" per patient.Clinical staff will follow up with patient.

## 2020-12-18 ENCOUNTER — Emergency Department (HOSPITAL_COMMUNITY)
Admission: EM | Admit: 2020-12-18 | Discharge: 2020-12-18 | Disposition: A | Discharge status: Home / Self Care | Payer: Medicaid Other | Attending: Emergency Medicine | Admitting: Emergency Medicine

## 2020-12-18 ENCOUNTER — Encounter (HOSPITAL_COMMUNITY): Payer: Self-pay | Admitting: *Deleted

## 2020-12-18 DIAGNOSIS — R630 Anorexia: Secondary | ICD-10-CM | POA: Insufficient documentation

## 2020-12-18 DIAGNOSIS — Z3A08 8 weeks gestation of pregnancy: Secondary | ICD-10-CM | POA: Diagnosis not present

## 2020-12-18 DIAGNOSIS — O26811 Pregnancy related exhaustion and fatigue, first trimester: Secondary | ICD-10-CM | POA: Insufficient documentation

## 2020-12-18 DIAGNOSIS — O219 Vomiting of pregnancy, unspecified: Secondary | ICD-10-CM | POA: Diagnosis not present

## 2020-12-18 DIAGNOSIS — Z3A01 Less than 8 weeks gestation of pregnancy: Secondary | ICD-10-CM | POA: Diagnosis not present

## 2020-12-18 DIAGNOSIS — R110 Nausea: Secondary | ICD-10-CM | POA: Diagnosis not present

## 2020-12-18 DIAGNOSIS — Z8616 Personal history of COVID-19: Secondary | ICD-10-CM | POA: Insufficient documentation

## 2020-12-18 DIAGNOSIS — J45909 Unspecified asthma, uncomplicated: Secondary | ICD-10-CM | POA: Insufficient documentation

## 2020-12-18 DIAGNOSIS — R200 Anesthesia of skin: Secondary | ICD-10-CM | POA: Insufficient documentation

## 2020-12-18 DIAGNOSIS — R5383 Other fatigue: Secondary | ICD-10-CM | POA: Diagnosis not present

## 2020-12-18 DIAGNOSIS — R102 Pelvic and perineal pain: Secondary | ICD-10-CM | POA: Insufficient documentation

## 2020-12-18 DIAGNOSIS — M545 Low back pain, unspecified: Secondary | ICD-10-CM | POA: Insufficient documentation

## 2020-12-18 DIAGNOSIS — Z7722 Contact with and (suspected) exposure to environmental tobacco smoke (acute) (chronic): Secondary | ICD-10-CM | POA: Insufficient documentation

## 2020-12-18 LAB — COMPREHENSIVE METABOLIC PANEL
ALT: 13 U/L (ref 0–44)
AST: 18 U/L (ref 15–41)
Albumin: 3.9 g/dL (ref 3.5–5.0)
Alkaline Phosphatase: 43 U/L (ref 38–126)
Anion gap: 9 (ref 5–15)
BUN: 8 mg/dL (ref 6–20)
CO2: 24 mmol/L (ref 22–32)
Calcium: 9 mg/dL (ref 8.9–10.3)
Chloride: 103 mmol/L (ref 98–111)
Creatinine, Ser: 0.7 mg/dL (ref 0.44–1.00)
GFR, Estimated: >60 mL/min (ref >60)
Glucose, Bld: 106 mg/dL — ABNORMAL HIGH (ref 70–99)
Potassium: 3.8 mmol/L (ref 3.5–5.1)
Sodium: 136 mmol/L (ref 135–145)
Total Bilirubin: 0.7 mg/dL (ref 0.3–1.2)
Total Protein: 7.2 g/dL (ref 6.5–8.1)

## 2020-12-18 LAB — CBC WITH DIFFERENTIAL/PLATELET
Abs Immature Granulocytes: 0.01 10*3/uL (ref 0.00–0.07)
Basophils Absolute: 0 10*3/uL (ref 0.0–0.1)
Basophils Relative: 0 %
Eosinophils Absolute: 0.1 10*3/uL (ref 0.0–0.5)
Eosinophils Relative: 1 %
HCT: 43.9 % (ref 36.0–46.0)
Hemoglobin: 14.3 g/dL (ref 12.0–15.0)
Immature Granulocytes: 0 %
Lymphocytes Relative: 13 %
Lymphs Abs: 1 10*3/uL (ref 0.7–4.0)
MCH: 28.8 pg (ref 26.0–34.0)
MCHC: 32.6 g/dL (ref 30.0–36.0)
MCV: 88.3 fL (ref 80.0–100.0)
Monocytes Absolute: 0.4 10*3/uL (ref 0.1–1.0)
Monocytes Relative: 5 %
Neutro Abs: 6.3 10*3/uL (ref 1.7–7.7)
Neutrophils Relative %: 81 %
Platelets: 201 10*3/uL (ref 150–400)
RBC: 4.97 MIL/uL (ref 3.87–5.11)
RDW: 13.1 % (ref 11.5–15.5)
WBC: 7.9 10*3/uL (ref 4.0–10.5)
nRBC: 0 % (ref 0.0–0.2)

## 2020-12-18 LAB — URINALYSIS, ROUTINE W REFLEX MICROSCOPIC
Bilirubin Urine: NEGATIVE
Glucose, UA: NEGATIVE mg/dL
Hgb urine dipstick: NEGATIVE
Ketones, ur: NEGATIVE mg/dL
Leukocytes,Ua: NEGATIVE
Nitrite: NEGATIVE
Protein, ur: NEGATIVE mg/dL
Specific Gravity, Urine: 1.004 — ABNORMAL LOW (ref 1.005–1.030)
pH: 7 (ref 5.0–8.0)

## 2020-12-18 LAB — TSH: TSH: 0.496 u[IU]/mL (ref 0.350–4.500)

## 2020-12-18 LAB — HCG, QUANTITATIVE, PREGNANCY: hCG, Beta Chain, Quant, S: 75894 m[IU]/mL — ABNORMAL HIGH (ref <5)

## 2020-12-18 LAB — HIV ANTIBODY (ROUTINE TESTING W REFLEX): HIV Screen 4th Generation wRfx: NONREACTIVE

## 2020-12-18 MED ORDER — PRENATAL VITAMIN 27-0.8 MG PO TABS: 1.0000 | ORAL_TABLET | Freq: Every day | ORAL | 2 refills | Status: DC

## 2020-12-18 MED ORDER — DOXYLAMINE-PYRIDOXINE 10-10 MG PO TBEC: 2.0000 | DELAYED_RELEASE_TABLET | Freq: Every day | ORAL | 0 refills | Status: DC

## 2020-12-18 NOTE — ED Triage Notes (Signed)
Multiple complaints, numbness in legs, headache, weak, back pain, x 3 days. [redacted] weeks pregnant. Has not had an ultrasound but has seen a doctor for pregnancy

## 2020-12-18 NOTE — Discharge Instructions (Signed)
Your lab tests and exam are reassuring today.  A TSH lab test (thyroid function test) has been added to your labs to ensure your fatigue is not due to a thyroid issue.  I suspect your symptoms are from early pregnancy.  Take the vitamin daily.  Use the diclegis if needed for nausea.  Plan followup with Dr. Despina Hidden next week as planned.

## 2020-12-18 NOTE — ED Notes (Signed)
Lab tech at bedside for TSH draw.

## 2020-12-18 NOTE — ED Provider Notes (Signed)
[facility] EMERGENCY DEPARTMENT Provider Note   CSN: [id number] Arrival date & time: 12/18/20  1212     History Chief Complaint  Patient presents with  . Weakness    Katelyn Martinez is a 25 y.o. female who is currently [redacted] weeks pregnant LMP 2/14, established with Dr. Despina Hidden with first [place] scheduled in 6 days, other hx including asthma and chronic low back pain since a fall incurred in 2016 presenting with multiple complaints including numbness in her bilateral lower thighs, worsened low back pain along with generalized weakness and new nausea without emesis and poor appetite for the past several days.  She denies cough, sob, abdominal pain, also denies dysuria, hematuria, vaginal dc or bleeding.  She does endorse vaginal "soreness" stating intercourse has been uncomfortable recently.  She had wet prep screening when seen at the gyn office on 3/16 which was negative.  She has been using monistat without improvement.    HPI     Past Medical History:  Diagnosis Date  . Asthma   . 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 Nexplanon insertion 04/03/2018   Inserted right arm 04/03/18  . Recurrent upper respiratory infection (URI)   . Trauma 12/22/2014   feel at [facility] and had skull Fracture     Patient Active Problem List   Diagnosis Date Noted  . Routine cervical smear 08/03/2020  . Encounter for initial prescription of injectable contraceptive 05/15/2020  . Screening examination for STD (sexually transmitted disease) 05/15/2020  . Hematuria 05/15/2020  . Burning with urination 05/15/2020  . Depression 05/15/2020  . COVID-19 virus detected 11/25 08/19/2019  . Tibial plateau fracture, right 12/24/2014  . Fracture of right fibula 12/24/2014  . Back abrasion 12/24/2014  . Lumbar transverse process fracture (HCC) 12/24/2014  . Asthma 12/24/2014  . Anxiety disorder 12/24/2014  . Intracranial hemorrhage (HCC)   . Pneumocephalus, traumatic   . Pulmonary contusion   . Temporal bone  fracture (HCC)   . Trauma   . Basilar skull fracture (HCC) 12/23/2014  . Fall by pediatric patient     Past Surgical History:  Procedure Laterality Date  . BLADDER SURGERY     uteral reimplantation  . ORIF TIBIA PLATEAU Right 12/29/2014   Procedure: OPEN REDUCTION INTERNAL FIXATION (ORIF) TIBIAL PLATEAU;  Surgeon: Sheral Apley, MD;  Location: [facility] OR;  Service: Orthopedics;  Laterality: Right;  . SALIVARY GLAND SURGERY    . TONSILLECTOMY       OB History    Gravida  2   Para  1   Term  1   Preterm      AB      Living  1     SAB      IAB      Ectopic      Multiple  0   Live Births  1           Family History  Problem Relation Age of Onset  . Ovarian cysts Mother   . Endometriosis Mother   . Fibromyalgia Mother   . Hepatitis C Mother   . Diabetes Mother   . Cancer Maternal Aunt        ovarian  . Cervical cancer Maternal Aunt   . Cancer Maternal Grandfather        bladder  . Cancer Paternal Grandmother        lung  . Bipolar disorder Father   . Suicidality Paternal Grandfather   . Cancer Maternal  Grandmother   . Other Sister        mood disorder; patella femoral syndrome    Social History   Tobacco Use  . Smoking status: Passive Smoke Exposure - Never Smoker  . Smokeless tobacco: Never Used  Vaping Use  . Vaping Use: Never used  Substance Use Topics  . Alcohol use: Yes    Comment: socially  . Drug use: Yes    Frequency: 7.0 times per week    Types: Marijuana    Home Medications Prior to Admission medications   Medication Sig Start Date End Date Taking? Authorizing Provider  Doxylamine-Pyridoxine (DICLEGIS) 10-10 MG TBEC Take 2 tablets by mouth at bedtime. 12/18/20  Yes Kyon Bentler, Raynelle Fanning, PA-C  Prenatal Vit-Fe Fumarate-FA (PRENATAL VITAMIN) 27-0.8 MG TABS Take 1 tablet by mouth daily. 12/18/20  Yes Taeya Theall, Raynelle Fanning, PA-C  escitalopram (LEXAPRO) 10 MG tablet Take 1 tablet (10 mg total) by mouth daily. Patient not taking: No sig reported 10/13/20  10/13/21  Cyril Mourning A, NP  terconazole (TERAZOL 7) 0.4 % vaginal cream PLACE 1 APPLICATOR VAGINALLY AT BEDTIME. Patient not taking: Reported on 12/02/2020 10/14/20   Arabella Merles, CNM  tiZANidine (ZANAFLEX) 4 MG tablet Take 1 tablet (4 mg total) by mouth every 8 (eight) hours as needed for muscle spasms. Patient not taking: Reported on 12/02/2020 10/15/20   Domenick Gong, MD    Allergies    Patient has no known allergies.  Review of Systems   Review of Systems  Constitutional: Positive for fatigue. Negative for fever.  HENT: Negative for congestion and sore throat.   Eyes: Negative.   Respiratory: Negative for chest tightness and shortness of breath.   Cardiovascular: Negative for chest pain.  Gastrointestinal: Positive for nausea. Negative for abdominal pain and vomiting.  Genitourinary: Negative.  Negative for vaginal bleeding and vaginal discharge.  Musculoskeletal: Negative for arthralgias, joint swelling and neck pain.  Skin: Negative.  Negative for rash and wound.  Neurological: Positive for numbness. Negative for dizziness, light-headedness and headaches.  Psychiatric/Behavioral: Negative.     Physical Exam Updated Vital Signs BP 105/65 (BP Location: Right Arm)   Pulse 72   Temp 97.8 F (36.6 C) (Oral)   Resp 18   LMP 11/02/2020 (Within Days)   SpO2 100%   Physical Exam Vitals and nursing note reviewed.  Constitutional:      Appearance: She is well-developed.  HENT:     Head: Normocephalic and atraumatic.  Eyes:     Conjunctiva/sclera: Conjunctivae normal.  Cardiovascular:     Rate and Rhythm: Normal rate and regular rhythm.     Heart sounds: Normal heart sounds.  Pulmonary:     Effort: Pulmonary effort is normal.     Breath sounds: Normal breath sounds. No wheezing.  Abdominal:     General: Bowel sounds are normal.     Palpations: Abdomen is soft.     Tenderness: There is no abdominal tenderness.  Genitourinary:    Comments: Pt  deferred Musculoskeletal:        General: Normal range of motion.     Cervical back: Normal range of motion.  Skin:    General: Skin is warm and dry.  Neurological:     General: No focal deficit present.     Mental Status: She is alert and oriented to person, place, and time.     Motor: No abnormal muscle tone or pronator drift.     Coordination: Heel to [facility] Test normal.     Deep  Tendon Reflexes:     Reflex Scores:      Bicep reflexes are 2+ on the right side and 2+ on the left side.      Patellar reflexes are 2+ on the right side and 2+ on the left side.    Comments: Equal grip strength.       ED Results / Procedures / Treatments   Labs (all labs ordered are listed, but only abnormal results are displayed) Labs Reviewed  COMPREHENSIVE METABOLIC PANEL - Abnormal; Notable for the following components:      Result Value   Glucose, Bld 106 (*)    All other components within normal limits  URINALYSIS, ROUTINE W REFLEX MICROSCOPIC - Abnormal; Notable for the following components:   APPearance CLOUDY (*)    Specific Gravity, Urine 1.004 (*)    All other components within normal limits  HCG, QUANTITATIVE, PREGNANCY - Abnormal; Notable for the following components:   hCG, Beta Chain, Quant, S [id number] (*)    All other components within normal limits  CBC WITH DIFFERENTIAL/PLATELET  HIV ANTIBODY (ROUTINE TESTING W REFLEX)  HIV ANTIBODY (ROUTINE TESTING W REFLEX)  TSH    EKG None  Radiology No results found.  Procedures Procedures   Medications Ordered in ED Medications - No data to display  ED Course  I have reviewed the triage vital signs and the nursing notes.  Pertinent labs & imaging results that were available during my care of the patient were reviewed by me and considered in my medical decision making (see chart for details).  Clinical Course as of 12/18/20 1449  Fri Dec 18, 2020  1432 Mother now at bedside, reporting she had her covid 19 booster about 6 weeks  ago, around the time she became pregnant.  Has concerns about generalized weakness and numbness in legs Guillame Barre vs early pregnancy sx.   [JI]    Clinical Course User Index [JI] Burgess Amor, PA-C   MDM Rules/Calculators/A&P                          Offered wet prep/pelvic exam.  Pt deferred stating will be seeing Dr. Despina Hidden next week for full exam and [place].  Wet prep/gc/chlamydia cancelled.  Pt has no neuro exam deficits regarding strength or dtr deficits.  Westley Chandler highly unlikely.  Has numbness in the bilateral medial distal thighs, more likely secondary to her chronic low back pain issues. Suspect generalized fatigue due to early pregnancy.  She is not on a prenatal vitamin which was started.  Also has new onset nausea, diclegis prescribed.  Plan f/u with Dr. Despina Hidden next week as scheduled.   Final Clinical Impression(s) / ED Diagnoses Final diagnoses:  Less than [redacted] weeks gestation of pregnancy  Fatigue, unspecified type  Nausea    Rx / DC Orders ED Discharge Orders         Ordered    Prenatal Vit-Fe Fumarate-FA (PRENATAL VITAMIN) 27-0.8 MG TABS  Daily        12/18/20 1446    Doxylamine-Pyridoxine (DICLEGIS) 10-10 MG TBEC  Daily at bedtime        12/18/20 1446           Burgess Amor, PA-C 12/18/20 1630    Jacalyn Lefevre, MD 12/21/20 [phone]

## 2020-12-21 ENCOUNTER — Telehealth: Payer: Self-pay

## 2020-12-21 NOTE — Telephone Encounter (Signed)
Transition Care Management Unsuccessful Follow-up Telephone Call  Date of discharge and from where:  12/18/2020 from [facility]  Attempts:  1st Attempt  Reason for unsuccessful TCM follow-up call:  Left voice message

## 2020-12-22 NOTE — Telephone Encounter (Signed)
Transition Care Management Unsuccessful Follow-up Telephone Call  Date of discharge and from where:  12/18/2020 from [facility]  Attempts:  2nd Attempt  Reason for unsuccessful TCM follow-up call:  Left voice message

## 2020-12-23 ENCOUNTER — Other Ambulatory Visit: Payer: Self-pay | Admitting: Obstetrics & Gynecology

## 2020-12-23 DIAGNOSIS — O3680X0 Pregnancy with inconclusive fetal viability, not applicable or unspecified: Secondary | ICD-10-CM

## 2020-12-24 ENCOUNTER — Ambulatory Visit (INDEPENDENT_AMBULATORY_CARE_PROVIDER_SITE_OTHER): Payer: Medicaid Other

## 2020-12-24 ENCOUNTER — Other Ambulatory Visit: Payer: Self-pay

## 2020-12-24 DIAGNOSIS — O3680X0 Pregnancy with inconclusive fetal viability, not applicable or unspecified: Secondary | ICD-10-CM | POA: Diagnosis not present

## 2020-12-24 NOTE — Telephone Encounter (Signed)
Transition Care Management Unsuccessful Follow-up Telephone Call  Date of discharge and from where:  12/18/2020 from [facility]  Attempts:  3rd Attempt  Reason for unsuccessful TCM follow-up call:  Unable to reach patient

## 2020-12-24 NOTE — Progress Notes (Signed)
[place] 7+3 wks,single IUP with ys,positive fht 160 bpm,normal ovaries,crl 14.4 mm

## 2021-01-04 ENCOUNTER — Telehealth: Payer: Self-pay | Admitting: Advanced Practice Midwife

## 2021-01-04 DIAGNOSIS — K529 Noninfective gastroenteritis and colitis, unspecified: Secondary | ICD-10-CM | POA: Diagnosis not present

## 2021-01-04 DIAGNOSIS — J069 Acute upper respiratory infection, unspecified: Secondary | ICD-10-CM | POA: Diagnosis not present

## 2021-01-04 DIAGNOSIS — J029 Acute pharyngitis, unspecified: Secondary | ICD-10-CM | POA: Diagnosis not present

## 2021-01-04 DIAGNOSIS — J019 Acute sinusitis, unspecified: Secondary | ICD-10-CM | POA: Diagnosis not present

## 2021-01-04 NOTE — Telephone Encounter (Signed)
Patient stated that she's having a lot of mucus coming out of her vaginal area and pain. Phone call was transferred to [facility].

## 2021-01-18 ENCOUNTER — Other Ambulatory Visit: Payer: Medicaid Other

## 2021-01-18 ENCOUNTER — Telehealth: Payer: Self-pay

## 2021-01-18 NOTE — Telephone Encounter (Signed)
Pt mother called stating that the pt is having ringing in her ears, swelling in her hands, feet, ankles and face, she has been light headed/ blurred vision and on 2 separate occasions felt faint (like she was going to black out). She is also having lower abd pain.  Also wanted to advise provider that she had her 2nd dose of her COVID vaccine on 01/27 or 01/28.

## 2021-01-18 NOTE — Telephone Encounter (Signed)
Called pt, relayed Kim Booker's msg regarding a CBC at [facility]. Pt stated that she could not go today, but would tomorrow. Informed her of [facility]'s hours and the order would be in the system for her. Pt confirmed understanding.

## 2021-01-26 ENCOUNTER — Other Ambulatory Visit: Payer: Self-pay | Admitting: Obstetrics & Gynecology

## 2021-01-26 DIAGNOSIS — Z3682 Encounter for antenatal screening for nuchal translucency: Secondary | ICD-10-CM

## 2021-01-27 ENCOUNTER — Ambulatory Visit: Payer: Medicaid Other | Admitting: *Deleted

## 2021-01-27 ENCOUNTER — Ambulatory Visit (INDEPENDENT_AMBULATORY_CARE_PROVIDER_SITE_OTHER): Payer: Medicaid Other

## 2021-01-27 ENCOUNTER — Ambulatory Visit (INDEPENDENT_AMBULATORY_CARE_PROVIDER_SITE_OTHER): Payer: Medicaid Other | Admitting: Advanced Practice Midwife

## 2021-01-27 ENCOUNTER — Other Ambulatory Visit (HOSPITAL_COMMUNITY)
Admission: RE | Admit: 2021-01-27 | Discharge: 2021-01-27 | Disposition: A | Discharge status: Home / Self Care | Payer: Medicaid Other | Source: Ambulatory Visit | Attending: Advanced Practice Midwife | Admitting: Advanced Practice Midwife

## 2021-01-27 ENCOUNTER — Other Ambulatory Visit: Payer: Self-pay

## 2021-01-27 ENCOUNTER — Encounter: Payer: Self-pay | Admitting: Advanced Practice Midwife

## 2021-01-27 VITALS — BP 105/80 | HR 76 | Wt 114.0 lb

## 2021-01-27 DIAGNOSIS — Z3480 Encounter for supervision of other normal pregnancy, unspecified trimester: Secondary | ICD-10-CM

## 2021-01-27 DIAGNOSIS — O09299 Supervision of pregnancy with other poor reproductive or obstetric history, unspecified trimester: Secondary | ICD-10-CM | POA: Insufficient documentation

## 2021-01-27 DIAGNOSIS — Z31430 Encounter of female for testing for genetic disease carrier status for procreative management: Secondary | ICD-10-CM | POA: Diagnosis not present

## 2021-01-27 DIAGNOSIS — N898 Other specified noninflammatory disorders of vagina: Secondary | ICD-10-CM

## 2021-01-27 DIAGNOSIS — Z3A12 12 weeks gestation of pregnancy: Secondary | ICD-10-CM

## 2021-01-27 DIAGNOSIS — Z3490 Encounter for supervision of normal pregnancy, unspecified, unspecified trimester: Secondary | ICD-10-CM | POA: Insufficient documentation

## 2021-01-27 DIAGNOSIS — Z3682 Encounter for antenatal screening for nuchal translucency: Secondary | ICD-10-CM

## 2021-01-27 DIAGNOSIS — F32A Depression, unspecified: Secondary | ICD-10-CM

## 2021-01-27 DIAGNOSIS — Z8632 Personal history of gestational diabetes: Secondary | ICD-10-CM | POA: Diagnosis not present

## 2021-01-27 LAB — POCT URINALYSIS DIPSTICK OB
Blood, UA: NEGATIVE
Glucose, UA: NEGATIVE
Ketones, UA: NEGATIVE
Leukocytes, UA: NEGATIVE
Nitrite, UA: NEGATIVE
POC,PROTEIN,UA: NEGATIVE

## 2021-01-27 MED ORDER — BLOOD PRESSURE MONITOR MISC: 0 refills | Status: DC

## 2021-01-27 NOTE — Patient Instructions (Signed)
Katelyn Martinez, I greatly value your feedback.  If you receive a survey following your visit with [place] today, we appreciate you taking the time to fill it out.  Thanks, Philipp Deputy, CNM   [facility] ([address] [place], [place] [address]) Entrance C, located off of [address] Free 24/7 valet parking   Nausea & Vomiting  Have saltine crackers or pretzels by your bed and eat a few bites before you raise your head out of bed in the morning  Eat small frequent meals throughout the day instead of large meals  Drink plenty of fluids throughout the day to stay hydrated, just don't drink a lot of fluids with your meals.  This can make your stomach fill up faster making you feel sick  Do not brush your teeth right after you eat  Products with real ginger are good for nausea, like ginger ale and ginger hard candy Make sure it says made with real ginger!  Sucking on sour candy like lemon heads is also good for nausea  If your prenatal vitamins make you nauseated, take them at night so you will sleep through the nausea  Sea Bands  If you feel like you need medicine for the nausea & vomiting please let us know  If you are unable to keep any fluids or food down please let us know   Constipation  Drink plenty of fluid, preferably water, throughout the day  Eat foods high in fiber such as fruits, vegetables, and grains  Exercise, such as walking, is a good way to keep your bowels regular  Drink warm fluids, especially warm prune juice, or decaf coffee  Eat a 1/2 cup of real oatmeal (not instant), 1/2 cup applesauce, and 1/2-1 cup warm prune juice every day  If needed, you may take Colace (docusate sodium) stool softener once or twice a day to help keep the stool soft.   If you still are having problems with constipation, you may take Miralax once daily as needed to help keep your bowels regular.   Home Blood Pressure Monitoring for Patients   Your  provider has recommended that you check your blood pressure (BP) at least once a week at home. If you do not have a blood pressure cuff at home, one will be provided for you. Contact your provider if you have not received your monitor within 1 week.   Helpful Tips for Accurate Home Blood Pressure Checks  . Don't smoke, exercise, or drink caffeine 30 minutes before checking your BP . Use the restroom before checking your BP (a full bladder can raise your pressure) . Relax in a comfortable upright chair . Feet on the ground . Left arm resting comfortably on a flat surface at the level of your heart . Legs uncrossed . Back supported . Sit quietly and don't talk . Place the cuff on your bare arm . Adjust snuggly, so that only two fingertips can fit between your skin and the top of the cuff . Check 2 readings separated by at least one minute . Keep a log of your BP readings . For a visual, please reference this diagram: http://ccnc.care/bpdiagram  Provider Name: [facility]     Phone: [phone]  Zone 1: ALL CLEAR  Continue to monitor your symptoms:  . BP reading is less than 140 (top number) or less than 90 (bottom number)  . No right upper stomach pain . No headaches or seeing  spots . No feeling nauseated or throwing up . No swelling in face and hands  Zone 2: CAUTION Call your doctor's office for any of the following:  . BP reading is greater than 140 (top number) or greater than 90 (bottom number)  . Stomach pain under your ribs in the middle or right side . Headaches or seeing spots . Feeling nauseated or throwing up . Swelling in face and hands  Zone 3: EMERGENCY  Seek immediate medical care if you have any of the following:  . BP reading is greater than160 (top number) or greater than 110 (bottom number) . Severe headaches not improving with Tylenol . Serious difficulty catching your breath . Any worsening symptoms from Zone 2    First Trimester of Pregnancy The  first trimester of pregnancy is from week 1 until the end of week 12 (months 1 through 3). A week after a sperm fertilizes an egg, the egg will implant on the wall of the uterus. This embryo will begin to develop into a baby. Genes from you and your partner are forming the baby. The female genes determine whether the baby is a boy or a girl. At 6-8 weeks, the eyes and face are formed, and the heartbeat can be seen on ultrasound. At the end of 12 weeks, all the baby's organs are formed.  Now that you are pregnant, you will want to do everything you can to have a healthy baby. Two of the most important things are to get good prenatal care and to follow your health care provider's instructions. Prenatal care is all the medical care you receive before the baby's birth. This care will help prevent, find, and treat any problems during the pregnancy and childbirth. BODY CHANGES Your body goes through many changes during pregnancy. The changes vary from woman to woman.   You may gain or lose a couple of pounds at first.  You may feel sick to your stomach (nauseous) and throw up (vomit). If the vomiting is uncontrollable, call your health care provider.  You may tire easily.  You may develop headaches that can be relieved by medicines approved by your health care provider.  You may urinate more often. Painful urination may mean you have a bladder infection.  You may develop heartburn as a result of your pregnancy.  You may develop constipation because certain hormones are causing the muscles that push waste through your intestines to slow down.  You may develop hemorrhoids or swollen, bulging veins (varicose veins).  Your breasts may begin to grow larger and become tender. Your nipples may stick out more, and the tissue that surrounds them (areola) may become darker.  Your gums may bleed and may be sensitive to brushing and flossing.  Dark spots or blotches (chloasma, mask of pregnancy) may develop on  your face. This will likely fade after the baby is born.  Your menstrual periods will stop.  You may have a loss of appetite.  You may develop cravings for certain kinds of food.  You may have changes in your emotions from day to day, such as being excited to be pregnant or being concerned that something may go wrong with the pregnancy and baby.  You may have more vivid and strange dreams.  You may have changes in your hair. These can include thickening of your hair, rapid growth, and changes in texture. Some women also have hair loss during or after pregnancy, or hair that feels dry or thin. Your hair  will most likely return to normal after your baby is born. WHAT TO EXPECT AT YOUR PRENATAL VISITS During a routine prenatal visit:  You will be weighed to make sure you and the baby are growing normally.  Your blood pressure will be taken.  Your abdomen will be measured to track your baby's growth.  The fetal heartbeat will be listened to starting around week 10 or 12 of your pregnancy.  Test results from any previous visits will be discussed. Your health care provider may ask you:  How you are feeling.  If you are feeling the baby move.  If you have had any abnormal symptoms, such as leaking fluid, bleeding, severe headaches, or abdominal cramping.  If you have any questions. Other tests that may be performed during your first trimester include:  Blood tests to find your blood type and to check for the presence of any previous infections. They will also be used to check for low iron levels (anemia) and Rh antibodies. Later in the pregnancy, blood tests for diabetes will be done along with other tests if problems develop.  Urine tests to check for infections, diabetes, or protein in the urine.  An ultrasound to confirm the proper growth and development of the baby.  An amniocentesis to check for possible genetic problems.  Fetal screens for spina bifida and Down  syndrome.  You may need other tests to make sure you and the baby are doing well. HOME CARE INSTRUCTIONS  Medicines  Follow your health care provider's instructions regarding medicine use. Specific medicines may be either safe or unsafe to take during pregnancy.  Take your prenatal vitamins as directed.  If you develop constipation, try taking a stool softener if your health care provider approves. Diet  Eat regular, well-balanced meals. Choose a variety of foods, such as meat or vegetable-based protein, fish, milk and low-fat dairy products, vegetables, fruits, and whole grain breads and cereals. Your health care provider will help you determine the amount of weight gain that is right for you.  Avoid raw meat and uncooked cheese. These carry germs that can cause birth defects in the baby.  Eating four or five small meals rather than three large meals a day may help relieve nausea and vomiting. If you start to feel nauseous, eating a few soda crackers can be helpful. Drinking liquids between meals instead of during meals also seems to help nausea and vomiting.  If you develop constipation, eat more high-fiber foods, such as fresh vegetables or fruit and whole grains. Drink enough fluids to keep your urine clear or pale yellow. Activity and Exercise  Exercise only as directed by your health care provider. Exercising will help you:  Control your weight.  Stay in shape.  Be prepared for labor and delivery.  Experiencing pain or cramping in the lower abdomen or low back is a good sign that you should stop exercising. Check with your health care provider before continuing normal exercises.  Try to avoid standing for long periods of time. Move your legs often if you must stand in one place for a long time.  Avoid heavy lifting.  Wear low-heeled shoes, and practice good posture.  You may continue to have sex unless your health care provider directs you otherwise. Relief of Pain or  Discomfort  Wear a good support bra for breast tenderness.    Take warm sitz baths to soothe any pain or discomfort caused by hemorrhoids. Use hemorrhoid cream if your health care provider approves.  Rest with your legs elevated if you have leg cramps or low back pain.  If you develop varicose veins in your legs, wear support hose. Elevate your feet for 15 minutes, 3-4 times a day. Limit salt in your diet. Prenatal Care  Schedule your prenatal visits by the twelfth week of pregnancy. They are usually scheduled monthly at first, then more often in the last 2 months before delivery.  Write down your questions. Take them to your prenatal visits.  Keep all your prenatal visits as directed by your health care provider. Safety  Wear your seat belt at all times when driving.  Make a list of emergency phone numbers, including numbers for family, friends, the hospital, and police and fire departments. General Tips  Ask your health care provider for a referral to a local prenatal education class. Begin classes no later than at the beginning of month 6 of your pregnancy.  Ask for help if you have counseling or nutritional needs during pregnancy. Your health care provider can offer advice or refer you to specialists for help with various needs.  Do not use hot tubs, steam rooms, or saunas.  Do not douche or use tampons or scented sanitary pads.  Do not cross your legs for long periods of time.  Avoid cat litter boxes and soil used by cats. These carry germs that can cause birth defects in the baby and possibly loss of the fetus by miscarriage or stillbirth.  Avoid all smoking, herbs, alcohol, and medicines not prescribed by your health care provider. Chemicals in these affect the formation and growth of the baby.  Schedule a dentist appointment. At home, brush your teeth with a soft toothbrush and be gentle when you floss. SEEK MEDICAL CARE IF:   You have dizziness.  You have mild  pelvic cramps, pelvic pressure, or nagging pain in the abdominal area.  You have persistent nausea, vomiting, or diarrhea.  You have a bad smelling vaginal discharge.  You have pain with urination.  You notice increased swelling in your face, hands, legs, or ankles. SEEK IMMEDIATE MEDICAL CARE IF:   You have a fever.  You are leaking fluid from your vagina.  You have spotting or bleeding from your vagina.  You have severe abdominal cramping or pain.  You have rapid weight gain or loss.  You vomit blood or material that looks like coffee grounds.  You are exposed to [place] measles and have never had them.  You are exposed to fifth disease or chickenpox.  You develop a severe headache.  You have shortness of breath.  You have any kind of trauma, such as from a fall or a car accident. Document Released: 08/30/2001 Document Revised: 01/20/2014 Document Reviewed: 07/16/2013 [facility] Patient Information 2015 [place], [place]. This information is not intended to replace advice given to you by your health care provider. Make sure you discuss any questions you have with your health care provider.

## 2021-01-27 NOTE — Progress Notes (Signed)
[place] 12+2 wks,measurements c/w dates,crl 66.79 mm,anterior placenta,fhr 137 bpm,normal ovaries,NB present,NT 1.5 mm

## 2021-01-27 NOTE — Progress Notes (Addendum)
INITIAL OBSTETRICAL VISIT Patient name: Katelyn Martinez MRN [id number]  Date of birth: 09-Apr-1996 Chief Complaint:   Initial Prenatal Visit  History of Present Illness:   Katelyn Martinez is a 25 y.o. G8P1001 Caucasian female at [redacted]w[redacted]d by LMP c/w u/s at 7.3 weeks with an Estimated Date of Delivery: 08/09/21 being seen today for her initial obstetrical visit.   Her obstetrical history is significant for SVBx 1; hx GDMA1.   Today she reports vag irritation/dysuria, but doing well otherwise.  Depression screen [facility] 2/9 01/27/2021 08/03/2020 05/15/2020 12/15/2016  Decreased Interest 2 1 3  0  Down, Depressed, Hopeless 2 1 1  0  PHQ - 2 Score 4 2 4  0  Altered sleeping 2 1 3 2   Tired, decreased energy 2 1 3 1   Change in appetite 2 1 0 0  Feeling bad or failure about yourself  2 1 0 0  Trouble concentrating 2 1 0 0  Moving slowly or fidgety/restless 2 1 0 0  Suicidal thoughts 2 0 0 0  PHQ-9 Score 18 8 10 3   Difficult doing work/chores - - Not difficult at all -    Patient's last menstrual period was 11/02/2020 (within days). Last pap Nov 2021. Results were: NILM w/ HRHPV not done Review of Systems:   Pertinent items are noted in HPI Denies cramping/contractions, leakage of fluid, vaginal bleeding, abnormal vaginal discharge w/ itching/odor/irritation, headaches, visual changes, shortness of breath, chest pain, abdominal pain, severe nausea/vomiting, or problems with urination or bowel movements unless otherwise stated above.  Pertinent History Reviewed:  Reviewed past medical,surgical, social, obstetrical and family history.  Reviewed problem list, medications and allergies. OB History  Gravida Para Term Preterm AB Living  2 1 1     1   SAB IAB Ectopic Multiple Live Births        0 1    # Outcome Date GA Lbr Len/2nd Weight Sex Delivery Anes PTL Lv  2 Current           1 Term 06/19/17 [redacted]w[redacted]d 04:41 / 00:21 5 lb 12.2 oz (2.614 kg) M Vag-Spont EPI N LIV     Complications: Gestational  diabetes   Physical Assessment:   Vitals:   01/27/21 0837  BP: 105/80  Pulse: 76  Weight: 114 lb (51.7 kg)  Body mass index is 18.97 kg/m.       Physical Examination:  General appearance - well appearing, and in no distress  Mental status - alert, oriented to person, place, and time  Psych:  She has a normal mood and affect  Skin - warm and dry, normal color, no suspicious lesions noted  Chest - effort normal, all lung fields clear to auscultation bilaterally  Heart - normal rate and regular rhythm  Abdomen - soft, nontender  Extremities:  No swelling or varicosities noted  Thin prep pap is not done    TODAY'S NT 12+2 wks,measurements c/w dates,crl 66.79 mm,anterior placenta,fhr 137 bpm,normal ovaries,NB present,NT 1.5 mm  Results for orders placed or performed in visit on 01/27/21 (from the past 24 hour(s))  POC Urinalysis Dipstick OB   Collection Time: 01/27/21  9:51 AM  Result Value Ref Range   Color, UA     Clarity, UA     Glucose, UA Negative Negative   Bilirubin, UA     Ketones, UA neg    Spec Grav, UA     Blood, UA neg    pH, UA     POC,PROTEIN,UA Negative Negative,  Trace, Small (1+), Moderate (2+), Large (3+), 4+   Urobilinogen, UA     Nitrite, UA neg    Leukocytes, UA Negative Negative   Appearance     Odor      Assessment & Plan:  1) Low-Risk Pregnancy G2P1001 at [redacted]w[redacted]d with an Estimated Date of Delivery: 08/09/21   2) Initial OB visit  3) Hx GDM, HgbA1c today  4) Anxiety/depression, PHQ9= 18; offered meds/BH referral> declines for now; tried Lexapro but felt 'weird'  5) Vag irritation, CV swab collected   Meds:  Meds ordered this encounter  Medications  . Blood Pressure Monitor MISC    Sig: For regular home bp monitoring during pregnancy    Dispense:  1 each    Refill:  0    Z34.81 Please mail to patient    Initial labs obtained Continue prenatal vitamins Reviewed n/v relief measures and warning s/s to report Reviewed recommended  weight gain based on pre-gravid BMI Encouraged well-balanced diet Genetic & carrier screening discussed: requests Panorama and NT/IT, requests Horizon 14  Ultrasound discussed; fetal survey: requested [facility]NC completed> form faxed if has or is planning to apply for medicaid The nature of [facility] [employer] with multiple MDs and other Advanced Practice Providers was explained to patient; also emphasized that fellows, residents, and students are part of our team. Does not have home bp cuff. Office bp cuff given: no; rx given. Check bp weekly, let us know if consistently >140/90.   No indications for ASA therapy (per uptodate)  Indications for early A1C (per uptodate) BMI >=25 (>=23 in Asian women) AND one of the following GDM in a previous pregnancy Yes  Follow-up: Return in about 4 weeks (around 02/24/2021) for LROB, in person, 2nd IT.   Orders Placed This Encounter  Procedures  . Urine Culture  . GC/Chlamydia Probe Amp  . Integrated 1  . Genetic Screening  . Pain Management Screening Profile (10S)  . Obstetric Panel, Including HIV  . Hepatitis C antibody  . Hemoglobin A1c  . POC Urinalysis Dipstick OB    Arabella Merles [facility] 01/27/2021 10:58 AM

## 2021-01-28 LAB — PMP SCREEN PROFILE (10S), URINE
Amphetamine Scrn, Ur: NEGATIVE ng/mL
BARBITURATE SCREEN URINE: NEGATIVE ng/mL
BENZODIAZEPINE SCREEN, URINE: NEGATIVE ng/mL
CANNABINOIDS UR QL SCN: NEGATIVE ng/mL
Cocaine (Metab) Scrn, Ur: NEGATIVE ng/mL
Creatinine(Crt), U: 137.2 mg/dL (ref 20.0–300.0)
Methadone Screen, Urine: NEGATIVE ng/mL
OXYCODONE+OXYMORPHONE UR QL SCN: NEGATIVE ng/mL
Opiate Scrn, Ur: NEGATIVE ng/mL
Ph of Urine: 6.3 (ref 4.5–8.9)
Phencyclidine Qn, Ur: NEGATIVE ng/mL
Propoxyphene Scrn, Ur: NEGATIVE ng/mL

## 2021-01-28 LAB — CERVICOVAGINAL ANCILLARY ONLY
Bacterial Vaginitis (gardnerella): NEGATIVE
Candida Glabrata: NEGATIVE
Candida Vaginitis: NEGATIVE
Comment: NEGATIVE
Comment: NEGATIVE
Comment: NEGATIVE
Comment: NEGATIVE
Trichomonas: NEGATIVE

## 2021-01-29 DIAGNOSIS — Z3A12 12 weeks gestation of pregnancy: Secondary | ICD-10-CM | POA: Diagnosis not present

## 2021-01-29 DIAGNOSIS — O09299 Supervision of pregnancy with other poor reproductive or obstetric history, unspecified trimester: Secondary | ICD-10-CM | POA: Diagnosis not present

## 2021-01-29 DIAGNOSIS — Z8632 Personal history of gestational diabetes: Secondary | ICD-10-CM | POA: Diagnosis not present

## 2021-01-29 DIAGNOSIS — Z3480 Encounter for supervision of other normal pregnancy, unspecified trimester: Secondary | ICD-10-CM | POA: Diagnosis not present

## 2021-01-29 LAB — GC/CHLAMYDIA PROBE AMP
Chlamydia trachomatis, NAA: NEGATIVE
Neisseria Gonorrhoeae by PCR: NEGATIVE

## 2021-01-30 LAB — URINE CULTURE

## 2021-02-02 LAB — OBSTETRIC PANEL, INCLUDING HIV
Antibody Screen: NEGATIVE
Basophils Absolute: 0 10*3/uL (ref 0.0–0.2)
Basos: 0 %
EOS (ABSOLUTE): 0.3 10*3/uL (ref 0.0–0.4)
Eos: 4 %
HIV Screen 4th Generation wRfx: NONREACTIVE
Hematocrit: 37.5 % (ref 34.0–46.6)
Hemoglobin: 12.5 g/dL (ref 11.1–15.9)
Hepatitis B Surface Ag: NEGATIVE
Immature Grans (Abs): 0 10*3/uL (ref 0.0–0.1)
Immature Granulocytes: 0 %
Lymphocytes Absolute: 1.3 10*3/uL (ref 0.7–3.1)
Lymphs: 17 %
MCH: 28.3 pg (ref 26.6–33.0)
MCHC: 33.3 g/dL (ref 31.5–35.7)
MCV: 85 fL (ref 79–97)
Monocytes Absolute: 0.4 10*3/uL (ref 0.1–0.9)
Monocytes: 5 %
Neutrophils Absolute: 5.7 10*3/uL (ref 1.4–7.0)
Neutrophils: 74 %
Platelets: 229 10*3/uL (ref 150–450)
RBC: 4.41 x10E6/uL (ref 3.77–5.28)
RDW: 12.9 % (ref 11.7–15.4)
RPR Ser Ql: NONREACTIVE
Rh Factor: POSITIVE
Rubella Antibodies, IGG: 2.07 index (ref >0.99)
WBC: 7.6 10*3/uL (ref 3.4–10.8)

## 2021-02-02 LAB — INTEGRATED 1
Crown Rump Length: 66.8 mm
Gest. Age on Collection Date: 13.1 weeks
Maternal Age at EDD: 24.4 yr
Nuchal Translucency (NT): 1.5 mm
Number of Fetuses: 1
PAPP-A Value: 756.1 ng/mL
Weight: 114 [lb_av]

## 2021-02-02 LAB — HGB FRACTIONATION CASCADE
Hgb A2: 2.8 % (ref 1.8–3.2)
Hgb A: 97.2 % (ref 96.4–98.8)
Hgb F: 0 % (ref 0.0–2.0)
Hgb S: 0 %

## 2021-02-02 LAB — HEMOGLOBIN A1C
Est. average glucose Bld gHb Est-mCnc: 97 mg/dL
Hgb A1c MFr Bld: 5 % (ref 4.8–5.6)

## 2021-02-02 LAB — HEPATITIS C ANTIBODY: Hep C Virus Ab: <0.1 s/co ratio (ref 0.0–0.9)

## 2021-02-24 ENCOUNTER — Encounter: Payer: Medicaid Other | Admitting: Advanced Practice Midwife

## 2021-02-25 ENCOUNTER — Encounter: Payer: Self-pay | Admitting: Women's Health

## 2021-02-25 ENCOUNTER — Ambulatory Visit (INDEPENDENT_AMBULATORY_CARE_PROVIDER_SITE_OTHER): Payer: Medicaid Other | Admitting: Women's Health

## 2021-02-25 ENCOUNTER — Encounter: Payer: Self-pay | Admitting: Advanced Practice Midwife

## 2021-02-25 ENCOUNTER — Other Ambulatory Visit: Payer: Self-pay

## 2021-02-25 VITALS — BP 107/65 | HR 92 | Wt 114.0 lb

## 2021-02-25 DIAGNOSIS — Z3A16 16 weeks gestation of pregnancy: Secondary | ICD-10-CM

## 2021-02-25 DIAGNOSIS — Z3480 Encounter for supervision of other normal pregnancy, unspecified trimester: Secondary | ICD-10-CM | POA: Diagnosis not present

## 2021-02-25 DIAGNOSIS — Z3482 Encounter for supervision of other normal pregnancy, second trimester: Secondary | ICD-10-CM

## 2021-02-25 DIAGNOSIS — Z363 Encounter for antenatal screening for malformations: Secondary | ICD-10-CM

## 2021-02-25 NOTE — Patient Instructions (Signed)
Katelyn Martinez, thank you for choosing our office today! We appreciate the opportunity to meet your healthcare needs. You may receive a short survey by mail, e-mail, or through MyChart. If you are happy with your care we would appreciate if you could take just a few minutes to complete the survey questions. We read all of your comments and take your feedback very seriously. Thank you again for choosing our office.  [facility] Team at [facility] ([address] [place], [place] [address]) Entrance C, located off of [address] Free 24/7 valet parking  Go to [email or web address] to register for FREE online childbirth classes  Call the office ([phone]) or go to [facility] if: You begin to severe cramping Your water breaks.  Sometimes it is a big gush of fluid, sometimes it is just a trickle that keeps getting your panties wet or running down your legs You have vaginal bleeding.  It is normal to have a small amount of spotting if your cervix was checked.   [place] [facility]/Family Doctors [facility] ([facility]): [address], [phone]           [facility]: [address], [phone]                [facility] ([facility]): [address], [phone] (call to ask if accepting patients) [facility]: [address], [place], [phone]    [place] [facility]/Family Doctors [facility] ([facility]): [address], [phone] [facility]: [address], [phone] [facility]: [address], [phone]  [place] [facility]  [facility] ([facility]): [phone] [facility]: [address], [phone]   [place] [facility]: [address], [phone]  [place] [facility]  [facility]: [address], [phone]  Home Blood Pressure Monitoring for Patients   Your provider has recommended that you check your blood pressure (BP) at least once a week at home. If you do not have a blood pressure cuff at home, one will be provided for you. Contact your provider if you have not received your monitor within 1 week.   Helpful Tips for Accurate Home Blood Pressure Checks  Don't smoke, exercise, or drink caffeine 30 minutes before checking your BP Use the restroom before checking your BP (a full bladder can raise your pressure) Relax in a comfortable upright chair Feet on the ground Left arm resting comfortably on a flat surface at the level of your heart Legs uncrossed Back supported Sit quietly and don't talk Place the cuff on your bare arm Adjust snuggly, so that only two fingertips can fit between your skin and the top of the cuff Check 2 readings separated by at least one minute Keep a log of your BP readings For a visual, please reference this diagram: http://[facility].care/bpdiagram  Provider Name: [facility]     Phone: [phone]  Zone 1: ALL CLEAR  Continue to monitor your symptoms:  BP reading is less than 140 (top number) or less than 90 (bottom number)  No right upper stomach pain No headaches or seeing spots No feeling nauseated or throwing up No swelling in face and hands  Zone 2: CAUTION Call your doctor's office for any of the following:  BP reading is greater than 140 (top number) or greater than   90 (bottom number)  Stomach pain under your ribs in the middle or right side Headaches or seeing spots Feeling nauseated or throwing up Swelling in face and hands  Zone 3: EMERGENCY  Seek immediate medical care if you have any of the following:  BP reading is greater than160 (top number) or greater than 110 (bottom number) Severe headaches not improving with Tylenol Serious difficulty catching your breath Any worsening symptoms from  Zone 2     Second Trimester of Pregnancy The second trimester is from week 14 through week 27 (months 4 through 6). The second trimester is often a time when you feel your best. Your body has adjusted to being pregnant, and you begin to feel better physically. Usually, morning sickness has lessened or quit completely, you may have more energy, and you may have an increase in appetite. The second trimester is also a time when the fetus is growing rapidly. At the end of the sixth month, the fetus is about 9 inches long and weighs about 1 pounds. You will likely begin to feel the baby move (quickening) between 16 and 20 weeks of pregnancy. Body changes during your second trimester Your body continues to go through many changes during your second trimester. The changes vary from woman to woman. Your weight will continue to increase. You will notice your lower abdomen bulging out. You may begin to get stretch marks on your hips, abdomen, and breasts. You may develop headaches that can be relieved by medicines. The medicines should be approved by your health care provider. You may urinate more often because the fetus is pressing on your bladder. You may develop or continue to have heartburn as a result of your pregnancy. You may develop constipation because certain hormones are causing the muscles that push waste through your intestines to slow down. You may develop hemorrhoids or swollen, bulging veins (varicose veins). You may have back pain. This is caused by: Weight gain. Pregnancy hormones that are relaxing the joints in your pelvis. A shift in weight and the muscles that support your balance. Your breasts will continue to grow and they will continue to become tender. Your gums may bleed and may be sensitive to brushing and flossing. Dark spots or blotches (chloasma, mask of pregnancy) may develop on your face. This will likely fade after the baby is born. A dark line from your belly button to  the pubic area (linea nigra) may appear. This will likely fade after the baby is born. You may have changes in your hair. These can include thickening of your hair, rapid growth, and changes in texture. Some women also have hair loss during or after pregnancy, or hair that feels dry or thin. Your hair will most likely return to normal after your baby is born.  What to expect at prenatal visits During a routine prenatal visit: You will be weighed to make sure you and the fetus are growing normally. Your blood pressure will be taken. Your abdomen will be measured to track your baby's growth. The fetal heartbeat will be listened to. Any test results from the previous visit will be discussed.  Your health care provider may ask you: How you are feeling. If you are feeling the baby move. If you have had any abnormal symptoms, such as leaking fluid, bleeding, severe headaches, or abdominal cramping. If you are using any tobacco products, including cigarettes, chewing tobacco, and electronic cigarettes. If you have any questions.  Other tests that may be performed during   your second trimester include: Blood tests that check for: Low iron levels (anemia). High blood sugar that affects pregnant women (gestational diabetes) between 24 and 28 weeks. Rh antibodies. This is to check for a protein on red blood cells (Rh factor). Urine tests to check for infections, diabetes, or protein in the urine. An ultrasound to confirm the proper growth and development of the baby. An amniocentesis to check for possible genetic problems. Fetal screens for spina bifida and Down syndrome. HIV (human immunodeficiency virus) testing. Routine prenatal testing includes screening for HIV, unless you choose not to have this test.  Follow these instructions at home: Medicines Follow your health care provider's instructions regarding medicine use. Specific medicines may be either safe or unsafe to take during  pregnancy. Take a prenatal vitamin that contains at least 600 micrograms (mcg) of folic acid. If you develop constipation, try taking a stool softener if your health care provider approves. Eating and drinking Eat a balanced diet that includes fresh fruits and vegetables, whole grains, good sources of protein such as meat, eggs, or tofu, and low-fat dairy. Your health care provider will help you determine the amount of weight gain that is right for you. Avoid raw meat and uncooked cheese. These carry germs that can cause birth defects in the baby. If you have low calcium intake from food, talk to your health care provider about whether you should take a daily calcium supplement. Limit foods that are high in fat and processed sugars, such as fried and sweet foods. To prevent constipation: Drink enough fluid to keep your urine clear or pale yellow. Eat foods that are high in fiber, such as fresh fruits and vegetables, whole grains, and beans. Activity Exercise only as directed by your health care provider. Most women can continue their usual exercise routine during pregnancy. Try to exercise for 30 minutes at least 5 days a week. Stop exercising if you experience uterine contractions. Avoid heavy lifting, wear low heel shoes, and practice good posture. A sexual relationship may be continued unless your health care provider directs you otherwise. Relieving pain and discomfort Wear a good support bra to prevent discomfort from breast tenderness. Take warm sitz baths to soothe any pain or discomfort caused by hemorrhoids. Use hemorrhoid cream if your health care provider approves. Rest with your legs elevated if you have leg cramps or low back pain. If you develop varicose veins, wear support hose. Elevate your feet for 15 minutes, 3-4 times a day. Limit salt in your diet. Prenatal Care Write down your questions. Take them to your prenatal visits. Keep all your prenatal visits as told by your health  care provider. This is important. Safety Wear your seat belt at all times when driving. Make a list of emergency phone numbers, including numbers for family, friends, the hospital, and police and fire departments. General instructions Ask your health care provider for a referral to a local prenatal education class. Begin classes no later than the beginning of month 6 of your pregnancy. Ask for help if you have counseling or nutritional needs during pregnancy. Your health care provider can offer advice or refer you to specialists for help with various needs. Do not use hot tubs, steam rooms, or saunas. Do not douche or use tampons or scented sanitary pads. Do not cross your legs for long periods of time. Avoid cat litter boxes and soil used by cats. These carry germs that can cause birth defects in the baby and possibly loss of the   fetus by miscarriage or stillbirth. Avoid all smoking, herbs, alcohol, and unprescribed drugs. Chemicals in these products can affect the formation and growth of the baby. Do not use any products that contain nicotine or tobacco, such as cigarettes and e-cigarettes. If you need help quitting, ask your health care provider. Visit your dentist if you have not gone yet during your pregnancy. Use a soft toothbrush to brush your teeth and be gentle when you floss. Contact a health care provider if: You have dizziness. You have mild pelvic cramps, pelvic pressure, or nagging pain in the abdominal area. You have persistent nausea, vomiting, or diarrhea. You have a bad smelling vaginal discharge. You have pain when you urinate. Get help right away if: You have a fever. You are leaking fluid from your vagina. You have spotting or bleeding from your vagina. You have severe abdominal cramping or pain. You have rapid weight gain or weight loss. You have shortness of breath with chest pain. You notice sudden or extreme swelling of your face, hands, ankles, feet, or legs. You  have not felt your baby move in over an hour. You have severe headaches that do not go away when you take medicine. You have vision changes. Summary The second trimester is from week 14 through week 27 (months 4 through 6). It is also a time when the fetus is growing rapidly. Your body goes through many changes during pregnancy. The changes vary from woman to woman. Avoid all smoking, herbs, alcohol, and unprescribed drugs. These chemicals affect the formation and growth your baby. Do not use any tobacco products, such as cigarettes, chewing tobacco, and e-cigarettes. If you need help quitting, ask your health care provider. Contact your health care provider if you have any questions. Keep all prenatal visits as told by your health care provider. This is important. This information is not intended to replace advice given to you by your health care provider. Make sure you discuss any questions you have with your health care provider. Document Released: 08/30/2001 Document Revised: 02/11/2016 Document Reviewed: 11/06/2012 Elsevier Interactive Patient Education  2017 [employer]  

## 2021-02-25 NOTE — Progress Notes (Signed)
LOW-RISK PREGNANCY VISIT Patient name: Katelyn Martinez MRN [id number]  Date of birth: 1995-10-25 Chief Complaint:   Routine Prenatal Visit  History of Present Illness:   Katelyn Martinez is a 25 y.o. G24P1001 female at [redacted]w[redacted]d with an Estimated Date of Delivery: 08/09/21 being seen today for ongoing management of a low-risk pregnancy.   Today she reports no complaints. Contractions: Not present. Vag. Bleeding: None.  Movement: Present. denies leaking of fluid.  Depression screen [facility] 2/9 01/27/2021 08/03/2020 05/15/2020 12/15/2016  Decreased Interest 2 1 3  0  Down, Depressed, Hopeless 2 1 1  0  PHQ - 2 Score 4 2 4  0  Altered sleeping 2 1 3 2   Tired, decreased energy 2 1 3 1   Change in appetite 2 1 0 0  Feeling bad or failure about yourself  2 1 0 0  Trouble concentrating 2 1 0 0  Moving slowly or fidgety/restless 2 1 0 0  Suicidal thoughts 2 0 0 0  PHQ-9 Score 18 8 10 3   Difficult doing work/chores - - Not difficult at all -     GAD 7 : Generalized Anxiety Score 01/27/2021 08/03/2020 05/15/2020  Nervous, Anxious, on Edge 2 1 1   Control/stop worrying 2 1 0  Worry too much - different things 2 1 1   Trouble relaxing 2 1 1   Restless 2 1 0  Easily annoyed or irritable 2 1 1   Afraid - awful might happen 2 1 0  Total GAD 7 Score 14 7 4       Review of Systems:   Pertinent items are noted in HPI Denies abnormal vaginal discharge w/ itching/odor/irritation, headaches, visual changes, shortness of breath, chest pain, abdominal pain, severe nausea/vomiting, or problems with urination or bowel movements unless otherwise stated above. Pertinent History Reviewed:  Reviewed past medical,surgical, social, obstetrical and family history.  Reviewed problem list, medications and allergies. Physical Assessment:   Vitals:   02/25/21 1114  BP: 107/65  Pulse: 92  Weight: 114 lb (51.7 kg)  Body mass index is 18.97 kg/m.        Physical Examination:   General appearance: Well appearing, and  in no distress  Mental status: Alert, oriented to person, place, and time  Skin: Warm & dry  Cardiovascular: Normal heart rate noted  Respiratory: Normal respiratory effort, no distress  Abdomen: Soft, gravid, nontender  Pelvic: Cervical exam deferred         Extremities: Edema: None  Fetal Status: Fetal Heart Rate (bpm): 140   Movement: Present    Chaperone: N/A   No results found for this or any previous visit (from the past 24 hour(s)).  Assessment & Plan:  1) Low-risk pregnancy G2P1001 at [redacted]w[redacted]d with an Estimated Date of Delivery: 08/09/21   2) H/O GDM, early A1C 5.0   Meds: No orders of the defined types were placed in this encounter.  Labs/procedures today: 2nd IT  Plan:  Continue routine obstetrical care  Next visit: prefers will be in person for u/s     Reviewed: Preterm labor symptoms and general obstetric precautions including but not limited to vaginal bleeding, contractions, leaking of fluid and fetal movement were reviewed in detail with the patient.  All questions were answered. Does have home bp cuff. Office bp cuff given: not applicable. Check bp weekly, let 03/29/2021 know if consistently >140 and/or >90.  Follow-up: Return in about 2 weeks (around 03/11/2021) for LROB, 05/17/2020, CNM, in person.  No future appointments.  Orders Placed This  Encounter  Procedures   US OB Comp + 14 Wk   INTEGRATED 2   Cheral Marker CNM, [facility] 02/25/2021 11:35 AM

## 2021-02-27 LAB — INTEGRATED 2
AFP MoM: 0.84
Alpha-Fetoprotein: 37.1 ng/mL
Crown Rump Length: 66.8 mm
DIA MoM: 0.51
DIA Value: 92.7 pg/mL
Estriol, Unconjugated: 1.49 ng/mL
Gest. Age on Collection Date: 13.1 weeks
Gestational Age: 17 weeks
Maternal Age at EDD: 24.4 yr
Nuchal Translucency (NT): 1.5 mm
Nuchal Translucency MoM: 1
Number of Fetuses: 1
PAPP-A MoM: 0.44
PAPP-A Value: 756.1 ng/mL
Test Results:: NEGATIVE
Weight: 114 [lb_av]
Weight: 114 [lb_av]
hCG MoM: 1.44
hCG Value: 50.1 IU/mL
uE3 MoM: 1.26

## 2021-03-24 ENCOUNTER — Other Ambulatory Visit: Payer: Self-pay

## 2021-03-24 ENCOUNTER — Other Ambulatory Visit (HOSPITAL_COMMUNITY)
Admission: RE | Admit: 2021-03-24 | Discharge: 2021-03-24 | Disposition: A | Discharge status: Home / Self Care | Payer: Medicaid Other | Source: Ambulatory Visit | Attending: Obstetrics & Gynecology | Admitting: Obstetrics & Gynecology

## 2021-03-24 ENCOUNTER — Ambulatory Visit (INDEPENDENT_AMBULATORY_CARE_PROVIDER_SITE_OTHER): Payer: Medicaid Other

## 2021-03-24 ENCOUNTER — Encounter: Payer: Self-pay | Admitting: Women's Health

## 2021-03-24 ENCOUNTER — Ambulatory Visit (INDEPENDENT_AMBULATORY_CARE_PROVIDER_SITE_OTHER): Payer: Medicaid Other | Admitting: Women's Health

## 2021-03-24 VITALS — BP 107/57 | HR 87 | Wt 119.2 lb

## 2021-03-24 DIAGNOSIS — N898 Other specified noninflammatory disorders of vagina: Secondary | ICD-10-CM | POA: Insufficient documentation

## 2021-03-24 DIAGNOSIS — O26892 Other specified pregnancy related conditions, second trimester: Secondary | ICD-10-CM | POA: Insufficient documentation

## 2021-03-24 DIAGNOSIS — Z3A20 20 weeks gestation of pregnancy: Secondary | ICD-10-CM

## 2021-03-24 DIAGNOSIS — Z3480 Encounter for supervision of other normal pregnancy, unspecified trimester: Secondary | ICD-10-CM

## 2021-03-24 DIAGNOSIS — Z3482 Encounter for supervision of other normal pregnancy, second trimester: Secondary | ICD-10-CM | POA: Diagnosis not present

## 2021-03-24 DIAGNOSIS — Z363 Encounter for antenatal screening for malformations: Secondary | ICD-10-CM

## 2021-03-24 NOTE — Progress Notes (Signed)
[place] 20+2 wks,cephalic,anterior placenta gr 0,fhr 142 bpm,normal ovaries,cx 3.2 cm,svp of fluid 3.9 cm,EFW 348 g 48%,anatomy complete,no obvious abnormalities

## 2021-03-24 NOTE — Patient Instructions (Addendum)
[place], thank you for choosing our office today! We appreciate the opportunity to meet your healthcare needs. You may receive a short survey by mail, e-mail, or through MyChart. If you are happy with your care we would appreciate if you could take just a few minutes to complete the survey questions. We read all of your comments and take your feedback very seriously. Thank you again for choosing our office.  [facility] Team at [facility] ([address] [place], [place] [address]) Entrance C, located off of [address] Free 24/7 valet parking  Go to [email or web address] to register for FREE online childbirth classes  Call the office ([phone]) or go to [facility] if: You begin to severe cramping Your water breaks.  Sometimes it is a big gush of fluid, sometimes it is just a trickle that keeps getting your panties wet or running down your legs You have vaginal bleeding.  It is normal to have a small amount of spotting if your cervix was checked.   [place] Pediatricians/Family Doctors [facility] ([facility]): [address], [phone]           [facility]: [address], [phone]                [facility] ([facility]): [address], [phone] (call to ask if accepting patients) [facility]: [address], [place], [phone]    [place] Pediatricians/Family Doctors [facility] ([facility]): [address], [phone] [facility]: [address], [phone] [facility]: [address], [phone]  [place] [facility]  [facility] ([facility]): [phone] [facility]: [address], [phone]   [place] Family Doctors [facility]: [address], [phone]  [place] [facility]  [facility]: [address], [phone]  Home Blood Pressure Monitoring for Patients   Your provider has recommended that you check your blood pressure (BP) at least once a week at home. If you do not have a blood pressure cuff at home, one will be provided for you. Contact your provider if you have not received your monitor within 1 week.   Helpful Tips for Accurate Home Blood Pressure Checks  Don't smoke, exercise, or drink caffeine 30 minutes before checking your BP Use the restroom before checking your BP (a full bladder can raise your pressure) Relax in a comfortable upright chair Feet on the ground Left arm resting comfortably on a flat surface at the level of your heart Legs uncrossed Back supported Sit quietly and don't talk Place the cuff on your bare arm Adjust snuggly, so that only two fingertips can fit between your skin and the top of the cuff Check 2 readings separated by at least one minute Keep a log of your BP readings For a visual, please reference this diagram: http://ccnc.care/bpdiagram  Provider Name: [facility]     Phone: [phone]  Zone 1: ALL CLEAR  Continue to monitor your symptoms:  BP reading is less than 140 (top number) or less than 90 (bottom number)  No right upper stomach pain No headaches or seeing spots No feeling nauseated or throwing up No swelling in face and hands  Zone 2: CAUTION Call your doctor's office for any of the following:  BP reading is greater than 140 (top number) or greater than   90 (bottom number)  Stomach pain under your ribs in the middle or right side Headaches or seeing spots Feeling nauseated or throwing up Swelling in face and hands  Zone 3: EMERGENCY  Seek immediate medical care if you have any of the following:  BP reading is greater than160 (top number) or greater than 110 (bottom number) Severe headaches not improving with Tylenol Serious difficulty catching your breath Any worsening symptoms from  Zone 2     Second Trimester of Pregnancy The second trimester is from week 14 through week 27 (months 4 through 6). The second trimester is often a time when you feel your best. Your body has adjusted to being pregnant, and you begin to feel better physically. Usually, morning sickness has lessened or quit completely, you may have more energy, and you may have an increase in appetite. The second trimester is also a time when the fetus is growing rapidly. At the end of the sixth month, the fetus is about 9 inches long and weighs about 1 pounds. You will likely begin to feel the baby move (quickening) between 16 and 20 weeks of pregnancy. Body changes during your second trimester Your body continues to go through many changes during your second trimester. The changes vary from woman to woman. Your weight will continue to increase. You will notice your lower abdomen bulging out. You may begin to get stretch marks on your hips, abdomen, and breasts. You may develop headaches that can be relieved by medicines. The medicines should be approved by your health care provider. You may urinate more often because the fetus is pressing on your bladder. You may develop or continue to have heartburn as a result of your pregnancy. You may develop constipation because certain hormones are causing the muscles that push waste through your intestines to slow down. You may develop hemorrhoids or swollen, bulging veins (varicose veins). You may have back pain. This is caused by: Weight gain. Pregnancy hormones that are relaxing the joints in your pelvis. A shift in weight and the muscles that support your balance. Your breasts will continue to grow and they will continue to become tender. Your gums may bleed and may be sensitive to brushing and flossing. Dark spots or blotches (chloasma, mask of pregnancy) may develop on your face. This will likely fade after the baby is born. A dark line from your belly button to  the pubic area (linea nigra) may appear. This will likely fade after the baby is born. You may have changes in your hair. These can include thickening of your hair, rapid growth, and changes in texture. Some women also have hair loss during or after pregnancy, or hair that feels dry or thin. Your hair will most likely return to normal after your baby is born.  What to expect at prenatal visits During a routine prenatal visit: You will be weighed to make sure you and the fetus are growing normally. Your blood pressure will be taken. Your abdomen will be measured to track your baby's growth. The fetal heartbeat will be listened to. Any test results from the previous visit will be discussed.  Your health care provider may ask you: How you are feeling. If you are feeling the baby move. If you have had any abnormal symptoms, such as leaking fluid, bleeding, severe headaches, or abdominal cramping. If you are using any tobacco products, including cigarettes, chewing tobacco, and electronic cigarettes. If you have any questions.  Other tests that may be performed during   your second trimester include: Blood tests that check for: Low iron levels (anemia). High blood sugar that affects pregnant women (gestational diabetes) between 24 and 28 weeks. Rh antibodies. This is to check for a protein on red blood cells (Rh factor). Urine tests to check for infections, diabetes, or protein in the urine. An ultrasound to confirm the proper growth and development of the baby. An amniocentesis to check for possible genetic problems. Fetal screens for spina bifida and Down syndrome. HIV (human immunodeficiency virus) testing. Routine prenatal testing includes screening for HIV, unless you choose not to have this test.  Follow these instructions at home: Medicines Follow your health care provider's instructions regarding medicine use. Specific medicines may be either safe or unsafe to take during  pregnancy. Take a prenatal vitamin that contains at least 600 micrograms (mcg) of folic acid. If you develop constipation, try taking a stool softener if your health care provider approves. Eating and drinking Eat a balanced diet that includes fresh fruits and vegetables, whole grains, good sources of protein such as meat, eggs, or tofu, and low-fat dairy. Your health care provider will help you determine the amount of weight gain that is right for you. Avoid raw meat and uncooked cheese. These carry germs that can cause birth defects in the baby. If you have low calcium intake from food, talk to your health care provider about whether you should take a daily calcium supplement. Limit foods that are high in fat and processed sugars, such as fried and sweet foods. To prevent constipation: Drink enough fluid to keep your urine clear or pale yellow. Eat foods that are high in fiber, such as fresh fruits and vegetables, whole grains, and beans. Activity Exercise only as directed by your health care provider. Most women can continue their usual exercise routine during pregnancy. Try to exercise for 30 minutes at least 5 days a week. Stop exercising if you experience uterine contractions. Avoid heavy lifting, wear low heel shoes, and practice good posture. A sexual relationship may be continued unless your health care provider directs you otherwise. Relieving pain and discomfort Wear a good support bra to prevent discomfort from breast tenderness. Take warm sitz baths to soothe any pain or discomfort caused by hemorrhoids. Use hemorrhoid cream if your health care provider approves. Rest with your legs elevated if you have leg cramps or low back pain. If you develop varicose veins, wear support hose. Elevate your feet for 15 minutes, 3-4 times a day. Limit salt in your diet. Prenatal Care Write down your questions. Take them to your prenatal visits. Keep all your prenatal visits as told by your health  care provider. This is important. Safety Wear your seat belt at all times when driving. Make a list of emergency phone numbers, including numbers for family, friends, the hospital, and police and fire departments. General instructions Ask your health care provider for a referral to a local prenatal education class. Begin classes no later than the beginning of month 6 of your pregnancy. Ask for help if you have counseling or nutritional needs during pregnancy. Your health care provider can offer advice or refer you to specialists for help with various needs. Do not use hot tubs, steam rooms, or saunas. Do not douche or use tampons or scented sanitary pads. Do not cross your legs for long periods of time. Avoid cat litter boxes and soil used by cats. These carry germs that can cause birth defects in the baby and possibly loss of the   fetus by miscarriage or stillbirth. Avoid all smoking, herbs, alcohol, and unprescribed drugs. Chemicals in these products can affect the formation and growth of the baby. Do not use any products that contain nicotine or tobacco, such as cigarettes and e-cigarettes. If you need help quitting, ask your health care provider. Visit your dentist if you have not gone yet during your pregnancy. Use a soft toothbrush to brush your teeth and be gentle when you floss. Contact a health care provider if: You have dizziness. You have mild pelvic cramps, pelvic pressure, or nagging pain in the abdominal area. You have persistent nausea, vomiting, or diarrhea. You have a bad smelling vaginal discharge. You have pain when you urinate. Get help right away if: You have a fever. You are leaking fluid from your vagina. You have spotting or bleeding from your vagina. You have severe abdominal cramping or pain. You have rapid weight gain or weight loss. You have shortness of breath with chest pain. You notice sudden or extreme swelling of your face, hands, ankles, feet, or legs. You  have not felt your baby move in over an hour. You have severe headaches that do not go away when you take medicine. You have vision changes. Summary The second trimester is from week 14 through week 27 (months 4 through 6). It is also a time when the fetus is growing rapidly. Your body goes through many changes during pregnancy. The changes vary from woman to woman. Avoid all smoking, herbs, alcohol, and unprescribed drugs. These chemicals affect the formation and growth your baby. Do not use any tobacco products, such as cigarettes, chewing tobacco, and e-cigarettes. If you need help quitting, ask your health care provider. Contact your health care provider if you have any questions. Keep all prenatal visits as told by your health care provider. This is important. This information is not intended to replace advice given to you by your health care provider. Make sure you discuss any questions you have with your health care provider. Document Released: 08/30/2001 Document Revised: 02/11/2016 Document Reviewed: 11/06/2012 Elsevier Interactive Patient Education  2017 [employer].    Considering [place]? Guide for patients at [facility]) Why consider waterbirth? Gentle birth for babies  Less pain medicine used in labor  May allow for passive descent/less pushing  May reduce perineal tears  More mobility and instinctive maternal position changes  Increased maternal relaxation   Is waterbirth safe? What are the risks of infection, drowning or other complications? Infection:  Very low risk (3.7 % for tub vs 4.8% for bed)  7 in 8000 waterbirths with documented infection  Poorly cleaned equipment most common cause  Slightly lower group B strep transmission rate  Drowning  Maternal:  Very low risk  Related to seizures or fainting  Newborn:  Very low risk. No evidence of increased risk of respiratory problems in multiple large studies  Physiological protection  from breathing under water  Avoid underwater birth if there are any fetal complications  Once baby's head is out of the water, keep it out.  Birth complication  Some reports of cord trauma, but risk decreased by bringing baby to surface gradually  No evidence of increased risk of shoulder dystocia. Mothers can usually change positions faster in water than in a bed, possibly aiding the maneuvers to free the shoulder.   There are 2 things you MUST do to have a waterbirth with [facility]: Attend a waterbirth class at [employer] [facility]  3rd Wednesday of every month from 7-9 pm (virtual during COVID) [employer] at [email or web address] or [email or web address] or by calling [phone] Bring [place] the certificate from the class to your prenatal appointment or send via MyChart Meet with a midwife at 36 weeks* to see if you can still plan a waterbirth and to sign the consent.   *We also recommend that you schedule as many of your prenatal visits with a midwife as possible.    Helpful information: You may want to bring a bathing suit top to the hospital to wear during labor but this is optional.  All other supplies are provided by the hospital. Please arrive at the hospital with signs of active labor, and do not wait at home until late in labor. It takes 45 min- 2 hours for COVID testing, fetal monitoring, and check in to your room to take place, plus transport and filling of the waterbirth tub.    Things that would prevent you from having a waterbirth: Unknown or Positive COVID-19 diagnosis upon admission to hospital* Premature, <37wks  Previous cesarean birth  Presence of thick meconium-stained fluid  Multiple gestation (Twins, triplets, etc.)  Uncontrolled diabetes or gestational diabetes requiring medication  Hypertension diagnosed in pregnancy or preexisting hypertension (gestational hypertension, preeclampsia, or chronic hypertension) Fetal growth  restriction (your baby measures less than 10th percentile on ultrasound) Heavy vaginal bleeding  Non-reassuring fetal heart rate  Active infection (MRSA, etc.). Group B Strep is NOT a contraindication for waterbirth.  If your labor has to be induced and induction method requires continuous monitoring of the baby's heart rate  Other risks/issues identified by your obstetrical provider   Please remember that birth is unpredictable. Under certain unforeseeable circumstances your provider may advise against giving birth in the tub. These decisions will be made on a case-by-case basis and with the safety of you and your baby as our highest priority.   *Please remember that in order to have a waterbirth, you must test Negative to COVID-19 upon admission to the hospital.  Updated 12/28/20

## 2021-03-24 NOTE — Progress Notes (Signed)
LOW-RISK PREGNANCY VISIT Patient name: Katelyn Martinez MRN [id number]  Date of birth: 1996/08/14 Chief Complaint:   Routine Prenatal Visit (Anatomy scan/Vaginal irritation)  History of Present Illness:   Katelyn Martinez is a 25 y.o. G88P1001 female at [redacted]w[redacted]d with an Estimated Date of Delivery: 08/09/21 being seen today for ongoing management of a low-risk pregnancy.   Today she reports vaginal itching and irritation. Interested in Systems developer. Contractions: Not present. Vag. Bleeding: None.  Movement: Present. denies leaking of fluid.  Depression screen [facility] 2/9 01/27/2021 08/03/2020 05/15/2020 12/15/2016  Decreased Interest 2 1 3  0  Down, Depressed, Hopeless 2 1 1  0  PHQ - 2 Score 4 2 4  0  Altered sleeping 2 1 3 2   Tired, decreased energy 2 1 3 1   Change in appetite 2 1 0 0  Feeling bad or failure about yourself  2 1 0 0  Trouble concentrating 2 1 0 0  Moving slowly or fidgety/restless 2 1 0 0  Suicidal thoughts 2 0 0 0  PHQ-9 Score 18 8 10 3   Difficult doing work/chores - - Not difficult at all -     GAD 7 : Generalized Anxiety Score 01/27/2021 08/03/2020 05/15/2020  Nervous, Anxious, on Edge 2 1 1   Control/stop worrying 2 1 0  Worry too much - different things 2 1 1   Trouble relaxing 2 1 1   Restless 2 1 0  Easily annoyed or irritable 2 1 1   Afraid - awful might happen 2 1 0  Total GAD 7 Score 14 7 4       Review of Systems:   Pertinent items are noted in HPI Denies abnormal vaginal discharge w/ itching/odor/irritation, headaches, visual changes, shortness of breath, chest pain, abdominal pain, severe nausea/vomiting, or problems with urination or bowel movements unless otherwise stated above. Pertinent History Reviewed:  Reviewed past medical,surgical, social, obstetrical and family history.  Reviewed problem list, medications and allergies. Physical Assessment:   Vitals:   03/24/21 1146  BP: (!) 107/57  Pulse: 87  Weight: 119 lb 3.2 oz (54.1 kg)  Body mass index is  19.84 kg/m.        Physical Examination:   General appearance: Well appearing, and in no distress  Mental status: Alert, oriented to person, place, and time  Skin: Warm & dry  Cardiovascular: Normal heart rate noted  Respiratory: Normal respiratory effort, no distress  Abdomen: Soft, gravid, nontender  Pelvic: exam normal, CV swab collected        Extremities: Edema: None  Fetal Status: Fetal Heart Rate (bpm): 142 u/s   Movement: Present   20+2 wks,cephalic,anterior placenta gr 0,fhr 142 bpm,normal ovaries,cx 3.2 cm,svp of fluid 3.9 cm,EFW 348 g 48%,anatomy complete,no obvious abnormalities  Chaperone: Latisha Cresenzo   No results found for this or any previous visit (from the past 24 hour(s)).  Assessment & Plan:  1) Low-risk pregnancy G2P1001 at [redacted]w[redacted]d with an Estimated Date of Delivery: 08/09/21   2) Vaginal irritation/itching, CV swab sent   Meds: No orders of the defined types were placed in this encounter.  Labs/procedures today: spec exam, CV swab, and U/S  Plan:  Continue routine obstetrical care  Next visit: prefers in person    Reviewed: Preterm labor symptoms and general obstetric precautions including but not limited to vaginal bleeding, contractions, leaking of fluid and fetal movement were reviewed in detail with the patient.  All questions were answered. Does have home bp cuff. Office bp cuff given: not applicable.  Check bp weekly, let us know if consistently >140 and/or >90.  Follow-up: Return in about 4 weeks (around 04/21/2021) for LROB, CNM, in person.  Future Appointments  Date Time Provider Department Center  04/21/2021 10:30 AM Arabella Merles, CNM [facility]    No orders of the defined types were placed in this encounter.  Cheral Marker CNM, [facility] 03/24/2021 12:14 PM

## 2021-03-25 LAB — CERVICOVAGINAL ANCILLARY ONLY
Bacterial Vaginitis (gardnerella): NEGATIVE
Candida Glabrata: NEGATIVE
Candida Vaginitis: POSITIVE — AB
Chlamydia: NEGATIVE
Comment: NEGATIVE
Comment: NEGATIVE
Comment: NEGATIVE
Comment: NEGATIVE
Comment: NEGATIVE
Comment: NORMAL
Neisseria Gonorrhea: NEGATIVE
Trichomonas: NEGATIVE

## 2021-04-21 ENCOUNTER — Ambulatory Visit (INDEPENDENT_AMBULATORY_CARE_PROVIDER_SITE_OTHER): Payer: Medicaid Other | Admitting: Advanced Practice Midwife

## 2021-04-21 ENCOUNTER — Other Ambulatory Visit: Payer: Self-pay

## 2021-04-21 ENCOUNTER — Encounter: Payer: Self-pay | Admitting: Advanced Practice Midwife

## 2021-04-21 VITALS — BP 106/68 | HR 81 | Wt 124.0 lb

## 2021-04-21 DIAGNOSIS — Z3A24 24 weeks gestation of pregnancy: Secondary | ICD-10-CM

## 2021-04-21 DIAGNOSIS — Z3480 Encounter for supervision of other normal pregnancy, unspecified trimester: Secondary | ICD-10-CM

## 2021-04-21 NOTE — Progress Notes (Signed)
   LOW-RISK PREGNANCY VISIT Patient name: Katelyn Martinez MRN [id number]  Date of birth: [birth year] Chief Complaint:   Routine Prenatal Visit  History of Present Illness:   Katelyn Martinez is a 25 y.o. G8P1001 female at [redacted]w[redacted]d with an Estimated Date of Delivery: 08/09/21 being seen today for ongoing management of a low-risk pregnancy.  Today she reports  occ BH ctx, no pattern; still interested in waterbirth and has class on 8/22 . Contractions: Irritability. Vag. Bleeding: None.  Movement: Present. denies leaking of fluid. Review of Systems:   Pertinent items are noted in HPI Denies abnormal vaginal discharge w/ itching/odor/irritation, headaches, visual changes, shortness of breath, chest pain, abdominal pain, severe nausea/vomiting, or problems with urination or bowel movements unless otherwise stated above. Pertinent History Reviewed:  Reviewed past medical,surgical, social, obstetrical and family history.  Reviewed problem list, medications and allergies. Physical Assessment:   Vitals:   04/21/21 1045  BP: 106/68  Pulse: 81  Weight: 124 lb (56.2 kg)  Body mass index is 20.63 kg/m.        Physical Examination:   General appearance: Well appearing, and in no distress  Mental status: Alert, oriented to person, place, and time  Skin: Warm & dry  Cardiovascular: Normal heart rate noted  Respiratory: Normal respiratory effort, no distress  Abdomen: Soft, gravid, nontender  Pelvic: Cervical exam deferred         Extremities: Edema: None  Fetal Status: Fetal Heart Rate (bpm): 143 Fundal Height: 24 cm Movement: Present    No results found for this or any previous visit (from the past 24 hour(s)).  Assessment & Plan:  1) Low-risk pregnancy G2P1001 at [redacted]w[redacted]d with an Estimated Date of Delivery: 08/09/21   2) Interested in waterbirth, has class on 8/22; discussed if has GDMA2 (had A1 last preg), would be excluded from being a candidate   Meds: No orders of the defined types were  placed in this encounter.  Labs/procedures today: none  Plan:  Continue routine obstetrical care   Reviewed: Preterm labor symptoms and general obstetric precautions including but not limited to vaginal bleeding, contractions, leaking of fluid and fetal movement were reviewed in detail with the patient.  All questions were answered. Didn't ask about home bp cuff. Check bp weekly, let us know if >140/90.   Follow-up: Return in about 3 weeks (around 05/12/2021) for LROB, PN2.  No orders of the defined types were placed in this encounter.  Arabella Merles CNM 04/21/2021 11:05 AM

## 2021-04-21 NOTE — Patient Instructions (Signed)
Katelyn Martinez, I greatly value your feedback.  If you receive a survey following your visit with us today, we appreciate you taking the time to fill it out.  Thanks, Kim Matthew Cina, CNM   You will have your sugar test next visit.  Please do not eat or drink anything after midnight the night before you come, not even water.  You will be here for at least two hours.  Please make an appointment online for the bloodwork at [email or web address] for 8:30am (or as close to this as possible). Make sure you select the [address] service center. The day of the appointment, check in with our office first, then you will go to [facility] to start the sugar test.    [facility] It is now [facility] ([address] [place],  [address]) Entrance C, located off of [address] Free 24/7 valet parking  Go to [email or web address] to register for FREE online childbirth classes   Call the office ([phone]) or go to [facility] if: You begin to have strong, frequent contractions Your water breaks.  Sometimes it is a big gush of fluid, sometimes it is just a trickle that keeps getting your panties wet or running down your legs You have vaginal bleeding.  It is normal to have a small amount of spotting if your cervix was checked.  You don't feel your baby moving like normal.  If you don't, get you something to eat and drink and lay down and focus on feeling your baby move.   If your baby is still not moving like normal, you should call the office or go to [facility].  [place] Pediatricians/Family Doctors: [facility] [phone]           [facility] [phone]                [facility] [phone] (usually not accepting new patients unless you have family there already, you are always welcome to call and ask)      [facility] [phone]       [place] Pediatricians/Family Doctors:  [facility]: [phone] [facility]: [phone] [facility]: [phone]  [place] Family Doctors:  [facility]: [phone]  [facility]: [phone]  [place] Family [facility]: [facility]: [phone]   Home Blood Pressure Monitoring for Patients   Your provider has recommended that you check your blood pressure (BP) at least once a week at home. If you do not have a blood pressure cuff at home, one will be provided for you. Contact your provider if you have not received your monitor within 1 week.   Helpful Tips for Accurate Home Blood Pressure Checks  Don't smoke, exercise, or drink caffeine 30 minutes before checking your BP Use the restroom before checking your BP (a full bladder can raise your pressure) Relax in a comfortable upright chair Feet on the ground Left arm resting comfortably on a flat surface at the level of your heart Legs uncrossed Back supported Sit quietly and don't talk Place the cuff on your bare arm Adjust snuggly, so that only two fingertips can fit between your skin and the top of the cuff Check 2 readings separated by at least one minute Keep a log of your BP readings For a visual, please reference this diagram: http://ccnc.care/bpdiagram  Provider Name: [facility]     Phone: [phone]  Zone 1: ALL CLEAR    Continue to monitor your symptoms:  BP reading is less than 140 (top number) or less than 90 (bottom number)  No right upper stomach pain No headaches or seeing spots No feeling nauseated or throwing up No swelling in face and hands  Zone 2: CAUTION Call your doctor's office for any of the following:  BP reading is greater than 140 (top number) or greater than 90 (bottom number)  Stomach pain under your ribs in the middle or right side Headaches or seeing spots Feeling nauseated or throwing up Swelling in face and hands  Zone 3: EMERGENCY   Seek immediate medical care if you have any of the following:  BP reading is greater than160 (top number) or greater than 110 (bottom number) Severe headaches not improving with Tylenol Serious difficulty catching your breath Any worsening symptoms from Zone 2   Second Trimester of Pregnancy The second trimester is from week 13 through week 28, months 4 through 6. The second trimester is often a time when you feel your best. Your body has also adjusted to being pregnant, and you begin to feel better physically. Usually, morning sickness has lessened or quit completely, you may have more energy, and you may have an increase in appetite. The second trimester is also a time when the fetus is growing rapidly. At the end of the sixth month, the fetus is about 9 inches long and weighs about 1 pounds. You will likely begin to feel the baby move (quickening) between 18 and 20 weeks of the pregnancy. BODY CHANGES Your body goes through many changes during pregnancy. The changes vary from woman to woman.  Your weight will continue to increase. You will notice your lower abdomen bulging out. You may begin to get stretch marks on your hips, abdomen, and breasts. You may develop headaches that can be relieved by medicines approved by your health care provider. You may urinate more often because the fetus is pressing on your bladder. You may develop or continue to have heartburn as a result of your pregnancy. You may develop constipation because certain hormones are causing the muscles that push waste through your intestines to slow down. You may develop hemorrhoids or swollen, bulging veins (varicose veins). You may have back pain because of the weight gain and pregnancy hormones relaxing your joints between the bones in your pelvis and as a result of a shift in weight and the muscles that support your balance. Your breasts will continue to grow and be tender. Your gums may bleed and may be sensitive to  brushing and flossing. Dark spots or blotches (chloasma, mask of pregnancy) may develop on your face. This will likely fade after the baby is born. A dark line from your belly button to the pubic area (linea nigra) may appear. This will likely fade after the baby is born. You may have changes in your hair. These can include thickening of your hair, rapid growth, and changes in texture. Some women also have hair loss during or after pregnancy, or hair that feels dry or thin. Your hair will most likely return to normal after your baby is born. WHAT TO EXPECT AT YOUR PRENATAL VISITS During a routine prenatal visit: You will be weighed to make sure you and the fetus are growing normally. Your blood pressure will be taken. Your abdomen will be measured to track your baby's growth. The fetal heartbeat will be listened to. Any test results from the previous visit will be discussed. Your health care provider  may ask you: How you are feeling. If you are feeling the baby move. If you have had any abnormal symptoms, such as leaking fluid, bleeding, severe headaches, or abdominal cramping. If you have any questions. Other tests that may be performed during your second trimester include: Blood tests that check for: Low iron levels (anemia). Gestational diabetes (between 24 and 28 weeks). Rh antibodies. Urine tests to check for infections, diabetes, or protein in the urine. An ultrasound to confirm the proper growth and development of the baby. An amniocentesis to check for possible genetic problems. Fetal screens for spina bifida and Down syndrome. HOME CARE INSTRUCTIONS  Avoid all smoking, herbs, alcohol, and unprescribed drugs. These chemicals affect the formation and growth of the baby. Follow your health care provider's instructions regarding medicine use. There are medicines that are either safe or unsafe to take during pregnancy. Exercise only as directed by your health care provider.  Experiencing uterine cramps is a good sign to stop exercising. Continue to eat regular, healthy meals. Wear a good support bra for breast tenderness. Do not use hot tubs, steam rooms, or saunas. Wear your seat belt at all times when driving. Avoid raw meat, uncooked cheese, cat litter boxes, and soil used by cats. These carry germs that can cause birth defects in the baby. Take your prenatal vitamins. Try taking a stool softener (if your health care provider approves) if you develop constipation. Eat more high-fiber foods, such as fresh vegetables or fruit and whole grains. Drink plenty of fluids to keep your urine clear or pale yellow. Take warm sitz baths to soothe any pain or discomfort caused by hemorrhoids. Use hemorrhoid cream if your health care provider approves. If you develop varicose veins, wear support hose. Elevate your feet for 15 minutes, 3-4 times a day. Limit salt in your diet. Avoid heavy lifting, wear low heel shoes, and practice good posture. Rest with your legs elevated if you have leg cramps or low back pain. Visit your dentist if you have not gone yet during your pregnancy. Use a soft toothbrush to brush your teeth and be gentle when you floss. A sexual relationship may be continued unless your health care provider directs you otherwise. Continue to go to all your prenatal visits as directed by your health care provider. SEEK MEDICAL CARE IF:  You have dizziness. You have mild pelvic cramps, pelvic pressure, or nagging pain in the abdominal area. You have persistent nausea, vomiting, or diarrhea. You have a bad smelling vaginal discharge. You have pain with urination. SEEK IMMEDIATE MEDICAL CARE IF:  You have a fever. You are leaking fluid from your vagina. You have spotting or bleeding from your vagina. You have severe abdominal cramping or pain. You have rapid weight gain or loss. You have shortness of breath with chest pain. You notice sudden or extreme swelling  of your face, hands, ankles, feet, or legs. You have not felt your baby move in over an hour. You have severe headaches that do not go away with medicine. You have vision changes. Document Released: 08/30/2001 Document Revised: 09/10/2013 Document Reviewed: 11/06/2012 [facility] Patient Information 2015 [place], [place]. This information is not intended to replace advice given to you by your health care provider. Make sure you discuss any questions you have with your health care provider.

## 2021-05-12 ENCOUNTER — Other Ambulatory Visit: Payer: Self-pay

## 2021-05-12 ENCOUNTER — Encounter: Payer: Self-pay | Admitting: Advanced Practice Midwife

## 2021-05-12 ENCOUNTER — Ambulatory Visit (INDEPENDENT_AMBULATORY_CARE_PROVIDER_SITE_OTHER): Payer: Medicaid Other | Admitting: Advanced Practice Midwife

## 2021-05-12 ENCOUNTER — Other Ambulatory Visit: Payer: Medicaid Other

## 2021-05-12 VITALS — BP 119/67 | HR 74

## 2021-05-12 DIAGNOSIS — Z3480 Encounter for supervision of other normal pregnancy, unspecified trimester: Secondary | ICD-10-CM

## 2021-05-12 DIAGNOSIS — Z23 Encounter for immunization: Secondary | ICD-10-CM

## 2021-05-12 DIAGNOSIS — Z3A27 27 weeks gestation of pregnancy: Secondary | ICD-10-CM

## 2021-05-12 NOTE — Patient Instructions (Signed)
[name], I greatly value your feedback.  If you receive a survey following your visit with [place] today, we appreciate you taking the time to fill it out.  Thanks, Cathie Beams, CNM   [facility] HAS MOVED!!! It is now [facility] ([address] [place], [place] [address]) Entrance located off of [address] Free 24/7 valet parking   Go to [email or web address] to register for FREE online childbirth classes    Call the office [phone]) or go to [facility] if: You begin to have strong, frequent contractions Your water breaks.  Sometimes it is a big gush of fluid, sometimes it is just a trickle that keeps getting your panties wet or running down your legs You have vaginal bleeding.  It is normal to have a small amount of spotting if your cervix was checked.  You don't feel your baby moving like normal.  If you don't, get you something to eat and drink and lay down and focus on feeling your baby move.  You should feel at least 10 movements in 2 hours.  If you don't, you should call the office or go to [facility]    Tdap Vaccine It is recommended that you get the Tdap vaccine during the third trimester of EACH pregnancy to help protect your baby from getting pertussis (whooping cough) 27-36 weeks is the BEST time to do this so that you can pass the protection on to your baby. During pregnancy is better than after pregnancy, but if you are unable to get it during pregnancy it will be offered at the hospital.  You will be offered this vaccine in the office after 27 weeks. If you do not have health insurance, you can get this vaccine at the health department or your family doctor Everyone who will be around your baby should also be up-to-date on their vaccines. Adults (who are not pregnant) only need 1 dose of Tdap during adulthood.   Third Trimester of Pregnancy The third trimester is from week 29 through week 42, months 7 through 9. The third  trimester is a time when the fetus is growing rapidly. At the end of the ninth month, the fetus is about 20 inches in length and weighs 6-10 pounds.  BODY CHANGES Your body goes through many changes during pregnancy. The changes vary from woman to woman.  Your weight will continue to increase. You can expect to gain 25-35 pounds (11-16 kg) by the end of the pregnancy. You may begin to get stretch marks on your hips, abdomen, and breasts. You may urinate more often because the fetus is moving lower into your pelvis and pressing on your bladder. You may develop or continue to have heartburn as a result of your pregnancy. You may develop constipation because certain hormones are causing the muscles that push waste through your intestines to slow down. You may develop hemorrhoids or swollen, bulging veins (varicose veins). You may have pelvic pain because of the weight gain and pregnancy hormones relaxing your joints between the bones in your pelvis. Backaches may result from overexertion of the muscles supporting your posture. You may have changes in your hair. These can include thickening of your hair, rapid growth, and changes in texture. Some women also have hair loss during or after pregnancy, or hair that feels dry or thin. Your hair will most likely return to normal after your baby is born. Your breasts will continue to grow and be tender. A  yellow discharge may leak from your breasts called colostrum. Your belly button may stick out. You may feel short of breath because of your expanding uterus. You may notice the fetus "dropping," or moving lower in your abdomen. You may have a bloody mucus discharge. This usually occurs a few days to a week before labor begins. Your cervix becomes thin and soft (effaced) near your due date. WHAT TO EXPECT AT YOUR PRENATAL EXAMS  You will have prenatal exams every 2 weeks until week 36. Then, you will have weekly prenatal exams. During a routine prenatal  visit: You will be weighed to make sure you and the fetus are growing normally. Your blood pressure is taken. Your abdomen will be measured to track your baby's growth. The fetal heartbeat will be listened to. Any test results from the previous visit will be discussed. You may have a cervical check near your due date to see if you have effaced. At around 36 weeks, your caregiver will check your cervix. At the same time, your caregiver will also perform a test on the secretions of the vaginal tissue. This test is to determine if a type of bacteria, Group B streptococcus, is present. Your caregiver will explain this further. Your caregiver may ask you: What your birth plan is. How you are feeling. If you are feeling the baby move. If you have had any abnormal symptoms, such as leaking fluid, bleeding, severe headaches, or abdominal cramping. If you have any questions. Other tests or screenings that may be performed during your third trimester include: Blood tests that check for low iron levels (anemia). Fetal testing to check the health, activity level, and growth of the fetus. Testing is done if you have certain medical conditions or if there are problems during the pregnancy. FALSE LABOR You may feel small, irregular contractions that eventually go away. These are called Braxton Hicks contractions, or false labor. Contractions may last for hours, days, or even weeks before true labor sets in. If contractions come at regular intervals, intensify, or become painful, it is best to be seen by your caregiver.  SIGNS OF LABOR  Menstrual-like cramps. Contractions that are 5 minutes apart or less. Contractions that start on the top of the uterus and spread down to the lower abdomen and back. A sense of increased pelvic pressure or back pain. A watery or bloody mucus discharge that comes from the vagina. If you have any of these signs before the 37th week of pregnancy, call your caregiver right away.  You need to go to the hospital to get checked immediately. HOME CARE INSTRUCTIONS  Avoid all smoking, herbs, alcohol, and unprescribed drugs. These chemicals affect the formation and growth of the baby. Follow your caregiver's instructions regarding medicine use. There are medicines that are either safe or unsafe to take during pregnancy. Exercise only as directed by your caregiver. Experiencing uterine cramps is a good sign to stop exercising. Continue to eat regular, healthy meals. Wear a good support bra for breast tenderness. Do not use hot tubs, steam rooms, or saunas. Wear your seat belt at all times when driving. Avoid raw meat, uncooked cheese, cat litter boxes, and soil used by cats. These carry germs that can cause birth defects in the baby. Take your prenatal vitamins. Try taking a stool softener (if your caregiver approves) if you develop constipation. Eat more high-fiber foods, such as fresh vegetables or fruit and whole grains. Drink plenty of fluids to keep your urine clear or pale yellow.  Take warm sitz baths to soothe any pain or discomfort caused by hemorrhoids. Use hemorrhoid cream if your caregiver approves. If you develop varicose veins, wear support hose. Elevate your feet for 15 minutes, 3-4 times a day. Limit salt in your diet. Avoid heavy lifting, wear low heal shoes, and practice good posture. Rest a lot with your legs elevated if you have leg cramps or low back pain. Visit your dentist if you have not gone during your pregnancy. Use a soft toothbrush to brush your teeth and be gentle when you floss. A sexual relationship may be continued unless your caregiver directs you otherwise. Do not travel far distances unless it is absolutely necessary and only with the approval of your caregiver. Take prenatal classes to understand, practice, and ask questions about the labor and delivery. Make a trial run to the hospital. Pack your hospital bag. Prepare the baby's  nursery. Continue to go to all your prenatal visits as directed by your caregiver. SEEK MEDICAL CARE IF: You are unsure if you are in labor or if your water has broken. You have dizziness. You have mild pelvic cramps, pelvic pressure, or nagging pain in your abdominal area. You have persistent nausea, vomiting, or diarrhea. You have a bad smelling vaginal discharge. You have pain with urination. SEEK IMMEDIATE MEDICAL CARE IF:  You have a fever. You are leaking fluid from your vagina. You have spotting or bleeding from your vagina. You have severe abdominal cramping or pain. You have rapid weight loss or gain. You have shortness of breath with chest pain. You notice sudden or extreme swelling of your face, hands, ankles, feet, or legs. You have not felt your baby move in over an hour. You have severe headaches that do not go away with medicine. You have vision changes. Document Released: 08/30/2001 Document Revised: 09/10/2013 Document Reviewed: 11/06/2012 [facility] Patient Information 2015 [place], [place]. This information is not intended to replace advice given to you by your health care provider. Make sure you discuss any questions you have with your health care provider. [phone] is the phone number for Pregnancy Classes or hospital tours at [facility].

## 2021-05-12 NOTE — Progress Notes (Signed)
   LOW-RISK PREGNANCY VISIT Patient name: Katelyn Martinez MRN [id number]  Date of birth: [birth year] Chief Complaint:   Routine Prenatal Visit (PN2)  History of Present Illness:   Katelyn Martinez is a 25 y.o. G36P1001 female at [redacted]w[redacted]d with an Estimated Date of Delivery: 08/09/21 being seen today for ongoing management of a low-risk pregnancy.  Today she reports no complaints. Contractions: Irritability. Vag. Bleeding: None.  Movement: Present. denies leaking of fluid. Review of Systems:   Pertinent items are noted in HPI Denies abnormal vaginal discharge w/ itching/odor/irritation, headaches, visual changes, shortness of breath, chest pain, abdominal pain, severe nausea/vomiting, or problems with urination or bowel movements unless otherwise stated above. Pertinent History Reviewed:  Reviewed past medical,surgical, social, obstetrical and family history.  Reviewed problem list, medications and allergies. Physical Assessment:   Vitals:   05/12/21 0923  BP: 119/67  Pulse: 74  There is no height or weight on file to calculate BMI.        Physical Examination:   General appearance: Well appearing, and in no distress  Mental status: Alert, oriented to person, place, and time  Skin: Warm & dry  Cardiovascular: Normal heart rate noted  Respiratory: Normal respiratory effort, no distress  Abdomen: Soft, gravid, nontender  Pelvic: Cervical exam deferred         Extremities: Edema: None  Fetal Status: Fetal Heart Rate (bpm): 148 Fundal Height: 27 cm Movement: Present    Chaperone: n/a    No results found for this or any previous visit (from the past 24 hour(s)).  Assessment & Plan:  1) Low-risk pregnancy G2P1001 at [redacted]w[redacted]d with an Estimated Date of Delivery: 08/09/21   2) Plans waterbirth, class taken, showed me the certificate on her phone   Meds: No orders of the defined types were placed in this encounter.  Labs/procedures today: PN2, tDap  Plan:  Continue routine obstetrical care   Next visit: prefers in person    Reviewed: Preterm labor symptoms and general obstetric precautions including but not limited to vaginal bleeding, contractions, leaking of fluid and fetal movement were reviewed in detail with the patient.  All questions were answered. Given home bp cuff. Check bp weekly, let us know if >140/90.   Follow-up: Return in about 3 weeks (around 06/02/2021) for LROB.  Orders Placed This Encounter  Procedures   Tdap vaccine greater than or equal to 7yo IM   Jacklyn Shell DNP, CNM 05/12/2021 10:13 AM

## 2021-05-13 LAB — GLUCOSE TOLERANCE, 2 HOURS W/ 1HR
Glucose, 1 hour: 91 mg/dL (ref 65–179)
Glucose, 2 hour: 86 mg/dL (ref 65–152)
Glucose, Fasting: 64 mg/dL — ABNORMAL LOW (ref 65–91)

## 2021-05-13 LAB — CBC
Hematocrit: 33.2 % — ABNORMAL LOW (ref 34.0–46.6)
Hemoglobin: 11.2 g/dL (ref 11.1–15.9)
MCH: 28.4 pg (ref 26.6–33.0)
MCHC: 33.7 g/dL (ref 31.5–35.7)
MCV: 84 fL (ref 79–97)
Platelets: 214 10*3/uL (ref 150–450)
RBC: 3.95 x10E6/uL (ref 3.77–5.28)
RDW: 13.1 % (ref 11.7–15.4)
WBC: 9.1 10*3/uL (ref 3.4–10.8)

## 2021-05-13 LAB — RPR: RPR Ser Ql: NONREACTIVE

## 2021-05-13 LAB — ANTIBODY SCREEN: Antibody Screen: NEGATIVE

## 2021-05-13 LAB — HIV ANTIBODY (ROUTINE TESTING W REFLEX): HIV Screen 4th Generation wRfx: NONREACTIVE

## 2021-06-02 ENCOUNTER — Encounter: Payer: Medicaid Other | Admitting: Obstetrics & Gynecology

## 2021-06-18 ENCOUNTER — Other Ambulatory Visit (INDEPENDENT_AMBULATORY_CARE_PROVIDER_SITE_OTHER): Payer: Medicaid Other

## 2021-06-18 ENCOUNTER — Other Ambulatory Visit: Payer: Self-pay

## 2021-06-18 ENCOUNTER — Other Ambulatory Visit (HOSPITAL_COMMUNITY)
Admission: RE | Admit: 2021-06-18 | Discharge: 2021-06-18 | Disposition: A | Discharge status: Home / Self Care | Payer: Medicaid Other | Source: Ambulatory Visit | Attending: Obstetrics & Gynecology | Admitting: Obstetrics & Gynecology

## 2021-06-18 VITALS — BP 106/65 | HR 78

## 2021-06-18 DIAGNOSIS — N898 Other specified noninflammatory disorders of vagina: Secondary | ICD-10-CM | POA: Insufficient documentation

## 2021-06-18 DIAGNOSIS — Z3480 Encounter for supervision of other normal pregnancy, unspecified trimester: Secondary | ICD-10-CM

## 2021-06-18 DIAGNOSIS — Z3A32 32 weeks gestation of pregnancy: Secondary | ICD-10-CM

## 2021-06-18 NOTE — Progress Notes (Addendum)
   NURSE VISIT- VAGINITIS/STD  SUBJECTIVE:  Katelyn Martinez is a 25 y.o. G2P1001 [redacted]w[redacted]d pregnantfemale here for a vaginal swab for vaginitis screening, STD screen.  She reports the following symptoms: mild cramping, burning, odor, and vulvar itching for 4-5 days.  Had a yeast infection back in July but states these symptoms are different.  Denies abnormal vaginal bleeding, significant pelvic pain, fever, or UTI symptoms.  OBJECTIVE:  LMP 11/02/2020 (Within Days)   Appears well, in no apparent distress  ASSESSMENT: Vaginal swab for vaginitis screening and std screening.  PLAN: Self-collected vaginal probe for Gonorrhea, Chlamydia, Trichomonas, Bacterial Vaginosis, Yeastn sent to lab Treatment: to be determined once results are received Follow-up as needed if symptoms persist/worsen, or new symptoms develop  Jobe Marker  06/18/2021 12:46 PM  Chart reviewed for nurse visit. Agree with plan of care.  Cheral Marker, [place] 06/18/2021 12:57 PM

## 2021-06-22 ENCOUNTER — Other Ambulatory Visit: Payer: Self-pay | Admitting: Women's Health

## 2021-06-22 LAB — CERVICOVAGINAL ANCILLARY ONLY
Bacterial Vaginitis (gardnerella): POSITIVE — AB
Candida Glabrata: NEGATIVE
Candida Vaginitis: POSITIVE — AB
Chlamydia: NEGATIVE
Comment: NEGATIVE
Comment: NEGATIVE
Comment: NEGATIVE
Comment: NEGATIVE
Comment: NEGATIVE
Comment: NORMAL
Neisseria Gonorrhea: NEGATIVE
Trichomonas: NEGATIVE

## 2021-06-22 MED ORDER — METRONIDAZOLE 500 MG PO TABS: 500.0000 mg | ORAL_TABLET | Freq: Two times a day (BID) | ORAL | 0 refills | Status: DC

## 2021-06-28 ENCOUNTER — Other Ambulatory Visit: Payer: Self-pay

## 2021-06-28 ENCOUNTER — Encounter: Payer: Self-pay | Admitting: Obstetrics & Gynecology

## 2021-06-28 ENCOUNTER — Ambulatory Visit (INDEPENDENT_AMBULATORY_CARE_PROVIDER_SITE_OTHER): Payer: Medicaid Other | Admitting: Obstetrics & Gynecology

## 2021-06-28 VITALS — BP 108/70 | HR 71 | Wt 134.0 lb

## 2021-06-28 DIAGNOSIS — Z3A34 34 weeks gestation of pregnancy: Secondary | ICD-10-CM

## 2021-06-28 DIAGNOSIS — Z3480 Encounter for supervision of other normal pregnancy, unspecified trimester: Secondary | ICD-10-CM

## 2021-06-28 NOTE — Progress Notes (Signed)
   LOW-RISK PREGNANCY VISIT Patient name: Katelyn Martinez MRN [id number]  Date of birth: [birth year] Chief Complaint:   Routine Prenatal Visit  History of Present Illness:   Katelyn Martinez is a 25 y.o. G19P1001 female at [redacted]w[redacted]d with an Estimated Date of Delivery: 08/09/21 being seen today for ongoing management of a low-risk pregnancy.  Depression screen [facility] 2/9 05/12/2021 01/27/2021 08/03/2020 05/15/2020 12/15/2016  Decreased Interest 0 2 1 3  0  Down, Depressed, Hopeless 0 2 1 1  0  PHQ - 2 Score 0 4 2 4  0  Altered sleeping 1 2 1 3 2   Tired, decreased energy 1 2 1 3 1   Change in appetite 0 2 1 0 0  Feeling bad or failure about yourself  0 2 1 0 0  Trouble concentrating 0 2 1 0 0  Moving slowly or fidgety/restless 0 2 1 0 0  Suicidal thoughts 0 2 0 0 0  PHQ-9 Score 2 18 8 10 3   Difficult doing work/chores - - - Not difficult at all -    Today she reports no complaints. Contractions: Not present. Vag. Bleeding: None.  Movement: Present. denies leaking of fluid. Review of Systems:   Pertinent items are noted in HPI Denies abnormal vaginal discharge w/ itching/odor/irritation, headaches, visual changes, shortness of breath, chest pain, abdominal pain, severe nausea/vomiting, or problems with urination or bowel movements unless otherwise stated above. Pertinent History Reviewed:  Reviewed past medical,surgical, social, obstetrical and family history.  Reviewed problem list, medications and allergies. Physical Assessment:   Vitals:   06/28/21 0916  BP: 108/70  Pulse: 71  Weight: 134 lb (60.8 kg)  Body mass index is 22.3 kg/m.        Physical Examination:   General appearance: Well appearing, and in no distress  Mental status: Alert, oriented to person, place, and time  Skin: Warm & dry  Cardiovascular: Normal heart rate noted  Respiratory: Normal respiratory effort, no distress  Abdomen: Soft, gravid, nontender  Pelvic: Cervical exam deferred         Extremities: Edema:  None  Fetal Status: Fetal Heart Rate (bpm): 142 Fundal Height: 34 cm Movement: Present    Chaperone: n/a    No results found for this or any previous visit (from the past 24 hour(s)).  Assessment & Plan:  1) Low-risk pregnancy G2P1001 at [redacted]w[redacted]d with an Estimated Date of Delivery: 08/09/21      Meds: No orders of the defined types were placed in this encounter.  Labs/procedures today:   Plan:  Continue routine obstetrical care  Next visit: prefers in person    Reviewed: Preterm labor symptoms and general obstetric precautions including but not limited to vaginal bleeding, contractions, leaking of fluid and fetal movement were reviewed in detail with the patient.  All questions were answered. Has home bp cuff. Rx faxed to . Check bp weekly, let know if >140/90.   Follow-up: Return in about 2 weeks (around 07/12/2021) for LROB.  No orders of the defined types were placed in this encounter.   , MD 06/28/2021 9:26 AM

## 2021-07-05 ENCOUNTER — Encounter (HOSPITAL_COMMUNITY): Payer: Self-pay | Admitting: Obstetrics & Gynecology

## 2021-07-05 ENCOUNTER — Other Ambulatory Visit: Payer: Self-pay

## 2021-07-05 ENCOUNTER — Inpatient Hospital Stay (HOSPITAL_COMMUNITY)
Admission: AD | Admit: 2021-07-05 | Discharge: 2021-07-05 | Disposition: A | Discharge status: Home / Self Care | Payer: Medicaid Other | Attending: Obstetrics & Gynecology | Admitting: Obstetrics & Gynecology

## 2021-07-05 DIAGNOSIS — Z3A35 35 weeks gestation of pregnancy: Secondary | ICD-10-CM

## 2021-07-05 DIAGNOSIS — O4703 False labor before 37 completed weeks of gestation, third trimester: Secondary | ICD-10-CM

## 2021-07-05 DIAGNOSIS — Z3480 Encounter for supervision of other normal pregnancy, unspecified trimester: Secondary | ICD-10-CM

## 2021-07-05 LAB — URINALYSIS, ROUTINE W REFLEX MICROSCOPIC
Bilirubin Urine: NEGATIVE
Glucose, UA: NEGATIVE mg/dL
Hgb urine dipstick: NEGATIVE
Ketones, ur: NEGATIVE mg/dL
Nitrite: NEGATIVE
Protein, ur: NEGATIVE mg/dL
Specific Gravity, Urine: 1.008 (ref 1.005–1.030)
pH: 7 (ref 5.0–8.0)

## 2021-07-05 MED ORDER — TERBUTALINE SULFATE 1 MG/ML IJ SOLN
0.2500 mg | Freq: Once | INTRAMUSCULAR | Status: AC
  Administered 2021-07-05: 0.25 mg via SUBCUTANEOUS
  Filled 2021-07-05: qty 1

## 2021-07-05 MED ORDER — NIFEDIPINE 10 MG PO CAPS
10.0000 mg | ORAL_CAPSULE | ORAL | Status: AC
  Administered 2021-07-05 (×2): 10 mg via ORAL
  Filled 2021-07-05 (×2): qty 1

## 2021-07-05 MED ORDER — NIFEDIPINE 10 MG PO CAPS: 10.0000 mg | ORAL_CAPSULE | Freq: Once | ORAL | Status: DC

## 2021-07-05 NOTE — MAU Provider Note (Addendum)
History     CSN: [id number]  Arrival date and time: 07/05/21 1843   Event Date/Time   First Provider Initiated Contact with Patient 07/05/21 1916      Chief Complaint  Patient presents with   Contractions   HPI  [name] is a 25 y.o. female G2P1001 [redacted]w[redacted]d here in MAU with contractions that started this morning. She feels the contractions every 3-4 minuets. Not all of the contractions are painful. She feels pressure in her pelvis. No recent intercourse. No vaginal bleeding. + fetal movement. She rates the pressure/pain in her pelvis 8/10.  States this pain feels different than braxton hick contractions. Her mother is present and contributing to the history taking.   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    Nexplanon insertion 04/03/2018   Inserted right arm 04/03/18   Recurrent upper respiratory infection (URI)    Trauma 12/22/2014   feel at [facility] and had skull Fracture     Past Surgical History:  Procedure Laterality Date   BLADDER SURGERY     uteral reimplantation   ORIF TIBIA PLATEAU Right 12/29/2014   Procedure: OPEN REDUCTION INTERNAL FIXATION (ORIF) TIBIAL PLATEAU;  Surgeon: Sheral Apley, MD;  Location: [facility] OR;  Service: Orthopedics;  Laterality: Right;   SALIVARY GLAND SURGERY     TONSILLECTOMY      Family History  Problem Relation Age of Onset   Ovarian cysts Mother    Endometriosis Mother    Fibromyalgia Mother    Hepatitis C Mother    Diabetes Mother    Cancer Maternal Aunt        ovarian   Cervical cancer Maternal Aunt    Cancer Maternal Grandfather        bladder   Cancer Paternal Grandmother        lung   Bipolar disorder Father    Suicidality Paternal Grandfather    Cancer Maternal Grandmother    Other  Sister        mood disorder; patella femoral syndrome    Social History   Tobacco Use   Smoking status: Former    Types: Cigarettes    Passive exposure: Yes   Smokeless tobacco: Never  Vaping Use   Vaping Use: Never used  Substance Use Topics   Alcohol use: Not Currently    Comment: socially   Drug use: Not Currently    Frequency: 7.0 times per week    Types: Marijuana    Allergies: No Known Allergies  Medications Prior to Admission  Medication Sig Dispense Refill Last Dose   Blood Pressure Monitor MISC For regular home bp monitoring during pregnancy (Patient not taking: No sig reported) 1 each 0    metroNIDAZOLE (FLAGYL) 500 MG tablet Take 1 tablet (500 mg total) by mouth 2 (two) times daily. 14 tablet 0    Prenatal Vit-Fe Fumarate-FA (PRENATAL VITAMIN) 27-0.8 MG TABS Take 1 tablet by mouth daily. (Patient not taking: Reported on 06/28/2021) 30  tablet 2    Results for orders placed or performed during the hospital encounter of 07/05/21 (from the past 48 hour(s))  Urinalysis, Routine w reflex microscopic Urine, Clean Catch     Status: Abnormal   Collection Time: 07/05/21  6:59 PM  Result Value Ref Range   Color, Urine YELLOW YELLOW   APPearance HAZY (A) CLEAR   Specific Gravity, Urine 1.008 1.005 - 1.030   pH 7.0 5.0 - 8.0   Glucose, UA NEGATIVE NEGATIVE mg/dL   Hgb urine dipstick NEGATIVE NEGATIVE   Bilirubin Urine NEGATIVE NEGATIVE   Ketones, ur NEGATIVE NEGATIVE mg/dL   Protein, ur NEGATIVE NEGATIVE mg/dL   Nitrite NEGATIVE NEGATIVE   Leukocytes,Ua LARGE (A) NEGATIVE   RBC / HPF 0-5 0 - 5 RBC/hpf   WBC, UA 6-10 0 - 5 WBC/hpf   Bacteria, UA FEW (A) NONE SEEN   Squamous Epithelial / LPF 21-50 0 - 5    Comment: Performed at [facility], [address] [place], [place] [address]    Review of Systems  Gastrointestinal:  Positive for abdominal pain.  Genitourinary:  Negative for vaginal bleeding and vaginal discharge.  Physical Exam   Blood pressure 110/67,  pulse (!) 102, temperature 98.2 F (36.8 C), temperature source Oral, resp. rate 18, height 5\' 5"  (1.651 m), weight 59.7 kg, last menstrual period 11/02/2020, SpO2 98 %.  Physical Exam Constitutional:      General: She is not in acute distress.    Appearance: Normal appearance. She is not ill-appearing, toxic-appearing or diaphoretic.  HENT:     Head: Normocephalic.  Abdominal:     Palpations: Abdomen is soft.     Tenderness: There is no abdominal tenderness.  Genitourinary:    Comments: Dilation: 1 Effacement (%): 50 Station: -3 Exam by:: Rasch,NP  Skin:    General: Skin is warm.  Neurological:     Mental Status: She is alert and oriented to person, place, and time.  Psychiatric:        Behavior: Behavior normal.   Fetal Tracing: Baseline: 130 bpm Variability: Moderate  Accelerations: 15x15 Decelerations: None Toco: frequent Q 3-5 mins apart.   Cervical Recheck @ 2130: cervix unchanged MAU Course  Procedures MDM  Procardia X 2 doses & Terb 0.25 given. Oral hydration.  Plan for cervical @ 2130 Report given to 2131 CNM who resumes care of the patient.   Rasch, Raelyn Mora, NP  Assessment and Plan  Preterm uterine contractions in third trimester, antepartum  - Information provided on PTL and preventing PTB   [redacted] weeks gestation of pregnancy   - Discharge patient - Keep scheduled appt with [facility] on  07/13/2021 - Patient verbalized an understanding of the plan of care and agrees.   07/15/2021, CNM  07/05/2021 10:00 PM

## 2021-07-05 NOTE — MAU Note (Signed)
Po hydrating 

## 2021-07-05 NOTE — MAU Note (Signed)
Started contracting this morning. Now every 4 min. No bleeding or leaking, has some mucous d/c. Ctxs don't hurt- just feels pressure, but her back has been hurting the last 3 dasy.

## 2021-07-06 ENCOUNTER — Encounter (HOSPITAL_COMMUNITY): Payer: Self-pay | Admitting: Obstetrics and Gynecology

## 2021-07-06 ENCOUNTER — Other Ambulatory Visit: Payer: Self-pay

## 2021-07-06 ENCOUNTER — Inpatient Hospital Stay (HOSPITAL_COMMUNITY)
Admission: AD | Admit: 2021-07-06 | Discharge: 2021-07-07 | Disposition: A | Discharge status: Home / Self Care | Payer: Medicaid Other | Attending: Obstetrics and Gynecology | Admitting: Obstetrics and Gynecology

## 2021-07-06 DIAGNOSIS — O26893 Other specified pregnancy related conditions, third trimester: Secondary | ICD-10-CM | POA: Diagnosis not present

## 2021-07-06 DIAGNOSIS — R1032 Left lower quadrant pain: Secondary | ICD-10-CM | POA: Diagnosis not present

## 2021-07-06 DIAGNOSIS — R1031 Right lower quadrant pain: Secondary | ICD-10-CM | POA: Insufficient documentation

## 2021-07-06 DIAGNOSIS — O99891 Other specified diseases and conditions complicating pregnancy: Secondary | ICD-10-CM

## 2021-07-06 DIAGNOSIS — O4700 False labor before 37 completed weeks of gestation, unspecified trimester: Secondary | ICD-10-CM

## 2021-07-06 DIAGNOSIS — Z87891 Personal history of nicotine dependence: Secondary | ICD-10-CM | POA: Diagnosis not present

## 2021-07-06 DIAGNOSIS — N949 Unspecified condition associated with female genital organs and menstrual cycle: Secondary | ICD-10-CM

## 2021-07-06 DIAGNOSIS — Z3A35 35 weeks gestation of pregnancy: Secondary | ICD-10-CM | POA: Insufficient documentation

## 2021-07-06 DIAGNOSIS — M545 Low back pain, unspecified: Secondary | ICD-10-CM | POA: Insufficient documentation

## 2021-07-06 DIAGNOSIS — N858 Other specified noninflammatory disorders of uterus: Secondary | ICD-10-CM

## 2021-07-06 DIAGNOSIS — M549 Dorsalgia, unspecified: Secondary | ICD-10-CM

## 2021-07-06 MED ORDER — CYCLOBENZAPRINE HCL 5 MG PO TABS: 5.0000 mg | ORAL_TABLET | Freq: Three times a day (TID) | ORAL | 0 refills | Status: DC | PRN

## 2021-07-06 MED ORDER — CYCLOBENZAPRINE HCL 5 MG PO TABS
5.0000 mg | ORAL_TABLET | Freq: Once | ORAL | Status: AC
  Administered 2021-07-06: 5 mg via ORAL
  Filled 2021-07-06: qty 1

## 2021-07-06 NOTE — MAU Provider Note (Addendum)
Chief Complaint:  Abdominal Pain   Event Date/Time   First Provider Initiated Contact with Patient 07/06/21 2113     HPI: Katelyn Martinez is a 25 y.o. G2P1001 at 63w1dwho presents to maternity admissions reporting bilateral groin pain and low back pain.  Was running after her child and lunged forward to pick him up.  Did not fall.  Has had back Pain but this is a bit worse.  Seen yesterday for preterm contractions. . She reports good fetal movement, denies LOF, vaginal bleeding, urinary symptoms, h/a, n/v, fever/chills.  .  Abdominal Pain This is a recurrent problem. The onset quality is gradual. The problem occurs intermittently. The problem has been unchanged. The quality of the pain is cramping. The abdominal pain radiates to the back. Pertinent negatives include no fever, headaches or myalgias. Associated symptoms comments: Bilateral groin pain, worse with moving legs . Nothing aggravates the pain. The pain is relieved by Nothing. She has tried nothing for the symptoms.   RN Note: Pt reports she was at the store and her other child decided to run out the doors and into the parking lot. Pt reports going after him and reaching to get him quickly and feels like she has pulled a muscle in her belly.   Pt denies falling.    Pt reports ctx's  Pt reports brown mucous. Denies vaginal bleeding. 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    Nexplanon insertion 04/03/2018   Inserted right arm 04/03/18   Recurrent upper respiratory infection (URI)    Trauma 12/22/2014   feel at [facility] and had skull Fracture     Past obstetric history: OB History  Gravida Para Term Preterm AB Living  2 1 1     1   SAB IAB Ectopic Multiple Live Births        0 1    # Outcome Date GA Lbr Len/2nd Weight Sex Delivery Anes PTL Lv  2 Current           1 Term 06/19/17 [redacted]w[redacted]d 04:41 / 00:21 2614 g M Vag-Spont EPI N LIV      Complications: Gestational diabetes    Past Surgical History: Past Surgical History:  Procedure Laterality Date   BLADDER SURGERY     uteral reimplantation   ORIF TIBIA PLATEAU Right 12/29/2014   Procedure: OPEN REDUCTION INTERNAL FIXATION (ORIF) TIBIAL PLATEAU;  Surgeon: 02/28/2015, MD;  Location: [facility] OR;  Service: Orthopedics;  Laterality: Right;   SALIVARY GLAND SURGERY     TONSILLECTOMY      Family History: Family History  Problem Relation Age of Onset   Ovarian cysts Mother    Endometriosis Mother    Fibromyalgia Mother    Hepatitis C Mother    Diabetes Mother    Cancer Maternal Aunt        ovarian   Cervical cancer Maternal Aunt    Cancer Maternal Grandfather        bladder   Cancer Paternal Grandmother        lung   Bipolar disorder Father    Suicidality Paternal Grandfather    Cancer Maternal Grandmother    Other Sister        mood disorder; patella femoral syndrome    Social History: Social History   Tobacco Use   Smoking status: Former    Types: Cigarettes    Passive exposure:  Yes   Smokeless tobacco: Never  Vaping Use   Vaping Use: Never used  Substance Use Topics   Alcohol use: Not Currently    Comment: socially   Drug use: Not Currently    Frequency: 7.0 times per week    Types: Marijuana    Allergies: No Known Allergies  Meds:  Medications Prior to Admission  Medication Sig Dispense Refill Last Dose   Blood Pressure Monitor MISC For regular home bp monitoring during pregnancy (Patient not taking: No sig reported) 1 each 0    Prenatal Vit-Fe Fumarate-FA (PRENATAL VITAMIN) 27-0.8 MG TABS Take 1 tablet by mouth daily. (Patient not taking: Reported on 06/28/2021) 30 tablet 2     I have reviewed patient's Past Medical Hx, Surgical Hx, Family Hx, Social Hx, medications and allergies.   ROS:  Review of Systems  Constitutional:  Negative for fever.  Gastrointestinal:  Positive for abdominal pain.  Musculoskeletal:  Negative for myalgias.   Neurological:  Negative for headaches.  Other systems negative  Physical Exam  Patient Vitals for the past 24 hrs:  BP Temp Pulse Resp SpO2  07/06/21 2105 121/77 98.2 F (36.8 C) 85 16 97 %   Constitutional: Well-developed, well-nourished female in no acute distress.  Cardiovascular: normal rate  Respiratory: normal effort GI: Abd soft, non-tender except for mild tenderness over round ligaments, gravid appropriate for gestational age.   No rebound or guarding. MS: Extremities nontender, no edema, normal ROM Neurologic: Alert and oriented x 4.  GU: Neg CVAT.  PELVIC EXAM:  Dilation: Fingertip Effacement (%): 40 Exam by:: Artelia Laroche, CNM  FHT:  Baseline 130 , moderate variability, accelerations present, no decelerations Contractions: q 6-7 mins Irregular  Patient does not have to breathe through UCs.   Labs: UA  yesterday showed leukocytes and few bacteria. O/Positive/-- (05/13 1412)  Imaging:  No results found.  MAU Course/MDM: I have ordered labs and reviewed results.  NST reviewed, reactive with irregular contractions and some irritability.  No change in cervix since yesterday  Treatments in MAU included Flexeril which did alleviate her groin pain and low back pain.  States UCs not very painful .    Assessment: Single IUP at [redacted]w[redacted]d S/P strain of round ligaments and groin muscles Low back pain   Plan: Discharge home Conservative care, has already paused work  Rx for limited # Flexeril for back pain provided Labor precautions and fetal kick counts Follow up in Office for prenatal visits  Encouraged to return if she develops worsening of symptoms, increase in pain, fever, or other concerning symptoms.  Pt stable at time of discharge.  Wynelle Bourgeois CNM, MSN Certified Nurse-Midwife 07/06/2021 9:14 PM

## 2021-07-06 NOTE — MAU Note (Addendum)
Pt reports she was at the store and her other child decided to run out the doors and into the parking lot. Pt reports going after him and reaching to get him quickly and feels like she has pulled a muscle in her belly.   Pt denies falling.   Pt reports ctx's  Pt reports brown mucous.   Denies vaginal bleeding.   Reports +FM

## 2021-07-13 ENCOUNTER — Encounter: Payer: Self-pay | Admitting: Women's Health

## 2021-07-13 ENCOUNTER — Other Ambulatory Visit (HOSPITAL_COMMUNITY)
Admission: RE | Admit: 2021-07-13 | Discharge: 2021-07-13 | Disposition: A | Discharge status: Home / Self Care | Payer: Medicaid Other | Source: Ambulatory Visit | Attending: Women's Health | Admitting: Women's Health

## 2021-07-13 ENCOUNTER — Other Ambulatory Visit: Payer: Self-pay

## 2021-07-13 ENCOUNTER — Ambulatory Visit (INDEPENDENT_AMBULATORY_CARE_PROVIDER_SITE_OTHER): Payer: Medicaid Other | Admitting: Women's Health

## 2021-07-13 VITALS — BP 106/70 | HR 87 | Wt 135.3 lb

## 2021-07-13 DIAGNOSIS — Z3A36 36 weeks gestation of pregnancy: Secondary | ICD-10-CM | POA: Diagnosis not present

## 2021-07-13 DIAGNOSIS — Z3483 Encounter for supervision of other normal pregnancy, third trimester: Secondary | ICD-10-CM | POA: Insufficient documentation

## 2021-07-13 DIAGNOSIS — Z1389 Encounter for screening for other disorder: Secondary | ICD-10-CM

## 2021-07-13 DIAGNOSIS — Z3480 Encounter for supervision of other normal pregnancy, unspecified trimester: Secondary | ICD-10-CM

## 2021-07-13 NOTE — Addendum Note (Signed)
Addended by: Colen Darling on: 07/13/2021 10:17 AM   Modules accepted: Orders

## 2021-07-13 NOTE — Patient Instructions (Signed)
Katelyn Martinez, thank you for choosing our office today! We appreciate the opportunity to meet your healthcare needs. You may receive a short survey by mail, e-mail, or through MyChart. If you are happy with your care we would appreciate if you could take just a few minutes to complete the survey questions. We read all of your comments and take your feedback very seriously. Thank you again for choosing our office.  [facility] ([address] [place], [place] [address]) Entrance C, located off of [address] Free 24/7 valet parking   CLASSES: Go to [email or web address] to register for classes (childbirth, breastfeeding, waterbirth, infant CPR, daddy bootcamp, etc.)  Call the office ([phone]) or go to [facility] if: You begin to have strong, frequent contractions Your water breaks.  Sometimes it is a big gush of fluid, sometimes it is just a trickle that keeps getting your panties wet or running down your legs You have vaginal bleeding.  It is normal to have a small amount of spotting if your cervix was checked.  You don't feel your baby moving like normal.  If you don't, get you something to eat and drink and lay down and focus on feeling your baby move.   If your baby is still not moving like normal, you should call the office or go to [facility].  Call the office ([phone]) or go to [facility] for these signs of pre-eclampsia: Severe headache that does not go away with Tylenol Visual changes- seeing spots, double, blurred vision Pain under your right breast or upper abdomen that does not go away with Tums or heartburn medicine Nausea and/or vomiting Severe swelling in your hands, feet, and face   Tdap Vaccine It is recommended that you get the Tdap vaccine during the third trimester of EACH pregnancy to help protect your baby from getting pertussis (whooping cough) 27-36 weeks is the BEST time to do  this so that you can pass the protection on to your baby. During pregnancy is better than after pregnancy, but if you are unable to get it during pregnancy it will be offered at the hospital.  You can get this vaccine with us, at the health department, your family doctor, or some local pharmacies Everyone who will be around your baby should also be up-to-date on their vaccines before the baby comes. Adults (who are not pregnant) only need 1 dose of Tdap during adulthood.   [place] Pediatricians/Family Doctors [facility] ([facility]): [address], [phone]           [facility]: [address], [phone]                [facility] ([facility]): [address], [phone] (call to ask if accepting patients) [facility]: [address], [place], [phone]    [place] Pediatricians/Family Doctors [facility] ([facility]): [address], [phone] [facility]: [address], [phone] [facility]: [address], [phone]  [place] [facility]  [facility] ([facility]): [phone] [facility]: [address], [phone]   [place] Family Doctors [facility]: [address], [phone]  [place] [facility]  [facility]: [phone], [phone]  Home Blood Pressure Monitoring for Patients   Your provider has recommended that you check your   blood pressure (BP) at least once a week at home. If you do not have a blood pressure cuff at home, one will be provided for you. Contact your provider if you have not received your monitor within 1 week.   Helpful Tips for Accurate Home Blood Pressure Checks  Don't smoke, exercise, or drink caffeine 30 minutes before checking your BP Use the restroom before checking your BP (a full bladder can raise your  pressure) Relax in a comfortable upright chair Feet on the ground Left arm resting comfortably on a flat surface at the level of your heart Legs uncrossed Back supported Sit quietly and don't talk Place the cuff on your bare arm Adjust snuggly, so that only two fingertips can fit between your skin and the top of the cuff Check 2 readings separated by at least one minute Keep a log of your BP readings For a visual, please reference this diagram: http://ccnc.care/bpdiagram  Provider Name: [facility]     Phone: [phone]  Zone 1: ALL CLEAR  Continue to monitor your symptoms:  BP reading is less than 140 (top number) or less than 90 (bottom number)  No right upper stomach pain No headaches or seeing spots No feeling nauseated or throwing up No swelling in face and hands  Zone 2: CAUTION Call your doctor's office for any of the following:  BP reading is greater than 140 (top number) or greater than 90 (bottom number)  Stomach pain under your ribs in the middle or right side Headaches or seeing spots Feeling nauseated or throwing up Swelling in face and hands  Zone 3: EMERGENCY  Seek immediate medical care if you have any of the following:  BP reading is greater than160 (top number) or greater than 110 (bottom number) Severe headaches not improving with Tylenol Serious difficulty catching your breath Any worsening symptoms from Zone 2  Preterm Labor and Birth Information  The normal length of a pregnancy is 39-41 weeks. Preterm labor is when labor starts before 37 completed weeks of pregnancy. What are the risk factors for preterm labor? Preterm labor is more likely to occur in women who: Have certain infections during pregnancy such as a bladder infection, sexually transmitted infection, or infection inside the uterus (chorioamnionitis). Have a shorter-than-normal cervix. Have gone into preterm labor before. Have had surgery on their cervix. Are younger than age 17  or older than age 35. Are African American. Are pregnant with twins or multiple babies (multiple gestation). Take street drugs or smoke while pregnant. Do not gain enough weight while pregnant. Became pregnant shortly after having been pregnant. What are the symptoms of preterm labor? Symptoms of preterm labor include: Cramps similar to those that can happen during a menstrual period. The cramps may happen with diarrhea. Pain in the abdomen or lower back. Regular uterine contractions that may feel like tightening of the abdomen. A feeling of increased pressure in the pelvis. Increased watery or bloody mucus discharge from the vagina. Water breaking (ruptured amniotic sac). Why is it important to recognize signs of preterm labor? It is important to recognize signs of preterm labor because babies who are born prematurely may not be fully developed. This can put them at an increased risk for: Long-term (chronic) heart and lung problems. Difficulty immediately after birth with regulating body systems, including blood sugar, body temperature, heart rate, and breathing rate. Bleeding in the brain. Cerebral palsy. Learning difficulties. Death. These risks are highest for babies who are born before 34 weeks   of pregnancy. How is preterm labor treated? Treatment depends on the length of your pregnancy, your condition, and the health of your baby. It may involve: Having a stitch (suture) placed in your cervix to prevent your cervix from opening too early (cerclage). Taking or being given medicines, such as: Hormone medicines. These may be given early in pregnancy to help support the pregnancy. Medicine to stop contractions. Medicines to help mature the baby's lungs. These may be prescribed if the risk of delivery is high. Medicines to prevent your baby from developing cerebral palsy. If the labor happens before 34 weeks of pregnancy, you may need to stay in the hospital. What should I do if I  think I am in preterm labor? If you think that you are going into preterm labor, call your health care provider right away. How can I prevent preterm labor in future pregnancies? To increase your chance of having a full-term pregnancy: Do not use any tobacco products, such as cigarettes, chewing tobacco, and e-cigarettes. If you need help quitting, ask your health care provider. Do not use street drugs or medicines that have not been prescribed to you during your pregnancy. Talk with your health care provider before taking any herbal supplements, even if you have been taking them regularly. Make sure you gain a healthy amount of weight during your pregnancy. Watch for infection. If you think that you might have an infection, get it checked right away. Make sure to tell your health care provider if you have gone into preterm labor before. This information is not intended to replace advice given to you by your health care provider. Make sure you discuss any questions you have with your health care provider. Document Revised: 12/28/2018 Document Reviewed: 01/27/2016 Elsevier Patient Education  [place]? Guide for patients at [facility] [employer] [facility]) Why consider waterbirth? Gentle birth for babies  Less pain medicine used in labor  May allow for passive descent/less pushing  May reduce perineal tears  More mobility and instinctive maternal position changes  Increased maternal relaxation   Is waterbirth safe? What are the risks of infection, drowning or other complications? Infection:  Very low risk (3.7 % for tub vs 4.8% for bed)  7 in 8000 waterbirths with documented infection  Poorly cleaned equipment most common cause  Slightly lower group B strep transmission rate  Drowning  Maternal:  Very low risk  Related to seizures or fainting  Newborn:  Very low risk. No evidence of increased risk of respiratory problems in multiple large  studies  Physiological protection from breathing under water  Avoid underwater birth if there are any fetal complications  Once baby's head is out of the water, keep it out.  Birth complication  Some reports of cord trauma, but risk decreased by bringing baby to surface gradually  No evidence of increased risk of shoulder dystocia. Mothers can usually change positions faster in water than in a bed, possibly aiding the maneuvers to free the shoulder.   There are 2 things you MUST do to have a waterbirth with [facility]: Attend a waterbirth class at [place] at [facility]   3rd Wednesday of every month from 7-9 pm (virtual during [place]) [employer] at [email or web address] or [email or web address] or by calling [phone] Bring [place] the certificate from the class to your prenatal appointment or send via [place] with a midwife at 36 weeks* to see if you can still plan a waterbirth and  to sign the consent.   *We also recommend that you schedule as many of your prenatal visits with a midwife as possible.    Helpful information: You may want to bring a bathing suit top to the hospital to wear during labor but this is optional.  All other supplies are provided by the hospital. Please arrive at the hospital with signs of active labor, and do not wait at home until late in labor. It takes 45 min- 2 hours for COVID testing, fetal monitoring, and check in to your room to take place, plus transport and filling of the waterbirth tub.    Things that would prevent you from having a waterbirth: Unknown or Positive COVID-19 diagnosis upon admission to hospital* Premature, <37wks  Previous cesarean birth  Presence of thick meconium-stained fluid  Multiple gestation (Twins, triplets, etc.)  Uncontrolled diabetes or gestational diabetes requiring medication  Hypertension diagnosed in pregnancy or preexisting hypertension (gestational hypertension, preeclampsia, or chronic  hypertension) Fetal growth restriction (your baby measures less than 10th percentile on ultrasound) Heavy vaginal bleeding  Non-reassuring fetal heart rate  Active infection (MRSA, etc.). Group B Strep is NOT a contraindication for waterbirth.  If your labor has to be induced and induction method requires continuous monitoring of the baby's heart rate  Other risks/issues identified by your obstetrical provider   Please remember that birth is unpredictable. Under certain unforeseeable circumstances your provider may advise against giving birth in the tub. These decisions will be made on a case-by-case basis and with the safety of you and your baby as our highest priority.   *Please remember that in order to have a waterbirth, you must test Negative to COVID-19 upon admission to the hospital.  Updated 12/28/20

## 2021-07-13 NOTE — Progress Notes (Signed)
LOW-RISK PREGNANCY VISIT Patient name: Katelyn Martinez MRN [id number]  Date of birth: 02-28-1996 Chief Complaint:   Routine Prenatal Visit (Cultures /contractions since last night)  History of Present Illness:   Katelyn Martinez is a 25 y.o. G30P1001 female at [redacted]w[redacted]d with an Estimated Date of Delivery: 08/09/21 being seen today for ongoing management of a low-risk pregnancy.   Today she reports  contractions since last night. Still wants waterbirth, attended class. . Contractions: Irregular.  .  Movement: Present. denies leaking of fluid.  Depression screen [facility] 2/9 05/12/2021 01/27/2021 08/03/2020 05/15/2020 12/15/2016  Decreased Interest 0 2 1 3  0  Down, Depressed, Hopeless 0 2 1 1  0  PHQ - 2 Score 0 4 2 4  0  Altered sleeping 1 2 1 3 2   Tired, decreased energy 1 2 1 3 1   Change in appetite 0 2 1 0 0  Feeling bad or failure about yourself  0 2 1 0 0  Trouble concentrating 0 2 1 0 0  Moving slowly or fidgety/restless 0 2 1 0 0  Suicidal thoughts 0 2 0 0 0  PHQ-9 Score 2 18 8 10 3   Difficult doing work/chores - - - Not difficult at all -     GAD 7 : Generalized Anxiety Score 05/12/2021 01/27/2021 08/03/2020 05/15/2020  Nervous, Anxious, on Edge 1 2 1 1   Control/stop worrying 1 2 1  0  Worry too much - different things 1 2 1 1   Trouble relaxing 1 2 1 1   Restless 1 2 1  0  Easily annoyed or irritable 1 2 1 1   Afraid - awful might happen 0 2 1 0  Total GAD 7 Score 6 14 7 4       Review of Systems:   Pertinent items are noted in HPI Denies abnormal vaginal discharge w/ itching/odor/irritation, headaches, visual changes, shortness of breath, chest pain, abdominal pain, severe nausea/vomiting, or problems with urination or bowel movements unless otherwise stated above. Pertinent History Reviewed:  Reviewed past medical,surgical, social, obstetrical and family history.  Reviewed problem list, medications and allergies. Physical Assessment:   Vitals:   07/13/21 0935  BP: 106/70   Pulse: 87  Weight: 135 lb 4.8 oz (61.4 kg)  Body mass index is 22.52 kg/m.        Physical Examination:   General appearance: Well appearing, and in no distress  Mental status: Alert, oriented to person, place, and time  Skin: Warm & dry  Cardiovascular: Normal heart rate noted  Respiratory: Normal respiratory effort, no distress  Abdomen: Soft, gravid, nontender  Pelvic: Cervical exam performed  Dilation: 1 Effacement (%): Thick Station: -3  Extremities: Edema: None  Fetal Status: Fetal Heart Rate (bpm): 152 Fundal Height: 36 cm Movement: Present Presentation: Vertex  Chaperone:   No results found for this or any previous visit (from the past 24 hour(s)).  Assessment & Plan:  1) Low-risk pregnancy G2P1001 at [redacted]w[redacted]d with an Estimated Date of Delivery: 08/09/21   2) Wants waterbirth, attended class, reviewed risks/benefits, signed consent today   Meds: No orders of the defined types were placed in this encounter.  Labs/procedures today: GBS, GC/CT, and SVE  Plan:  Continue routine obstetrical care  Next visit: prefers in person    Reviewed: Preterm labor symptoms and general obstetric precautions including but not limited to vaginal bleeding, contractions, leaking of fluid and fetal movement were reviewed in detail with the patient.  All questions were answered.   Follow-up: Return  for weekly, CNM, in person.  No future appointments.  Orders Placed This Encounter  Procedures   Culture, beta strep (group b only)   Cheral Marker CNM, [facility] 07/13/2021 9:59 AM

## 2021-07-14 LAB — CERVICOVAGINAL ANCILLARY ONLY
Bacterial Vaginitis (gardnerella): NEGATIVE
Candida Glabrata: NEGATIVE
Candida Vaginitis: NEGATIVE
Chlamydia: NEGATIVE
Comment: NEGATIVE
Comment: NEGATIVE
Comment: NEGATIVE
Comment: NEGATIVE
Comment: NEGATIVE
Comment: NORMAL
Neisseria Gonorrhea: NEGATIVE
Trichomonas: NEGATIVE

## 2021-07-17 LAB — CULTURE, BETA STREP (GROUP B ONLY): Strep Gp B Culture: NEGATIVE

## 2021-07-17 LAB — SPECIMEN STATUS REPORT

## 2021-07-20 ENCOUNTER — Encounter: Payer: Self-pay | Admitting: Women's Health

## 2021-07-20 ENCOUNTER — Other Ambulatory Visit: Payer: Self-pay

## 2021-07-20 ENCOUNTER — Ambulatory Visit (INDEPENDENT_AMBULATORY_CARE_PROVIDER_SITE_OTHER): Payer: Medicaid Other | Admitting: Women's Health

## 2021-07-20 VITALS — BP 117/54 | HR 85 | Wt 132.0 lb

## 2021-07-20 DIAGNOSIS — Z3483 Encounter for supervision of other normal pregnancy, third trimester: Secondary | ICD-10-CM

## 2021-07-20 DIAGNOSIS — F418 Other specified anxiety disorders: Secondary | ICD-10-CM

## 2021-07-20 NOTE — Progress Notes (Signed)
LOW-RISK PREGNANCY VISIT Patient name: Katelyn Martinez MRN [id number]  Date of birth: 04-11-1996 Chief Complaint:   Routine Prenatal Visit  History of Present Illness:   Katelyn Martinez is a 25 y.o. G78P1001 female at [redacted]w[redacted]d with an Estimated Date of Delivery: 08/09/21 being seen today for ongoing management of a low-risk pregnancy.   Today she reports  'just over it', not eating well/decreased appetite. +dep/anx, denies SI/HI, states we tried meds in past and made her feel worse. Declines meds/IBH referral today, will let us know if she changes her mind  . Contractions: Irregular. Vag. Bleeding: None.  Movement: Present. denies leaking of fluid.  Depression screen [facility] 2/9 05/12/2021 01/27/2021 08/03/2020 05/15/2020 12/15/2016  Decreased Interest 0 2 1 3  0  Down, Depressed, Hopeless 0 2 1 1  0  PHQ - 2 Score 0 4 2 4  0  Altered sleeping 1 2 1 3 2   Tired, decreased energy 1 2 1 3 1   Change in appetite 0 2 1 0 0  Feeling bad or failure about yourself  0 2 1 0 0  Trouble concentrating 0 2 1 0 0  Moving slowly or fidgety/restless 0 2 1 0 0  Suicidal thoughts 0 2 0 0 0  PHQ-9 Score 2 18 8 10 3   Difficult doing work/chores - - - Not difficult at all -     GAD 7 : Generalized Anxiety Score 05/12/2021 01/27/2021 08/03/2020 05/15/2020  Nervous, Anxious, on Edge 1 2 1 1   Control/stop worrying 1 2 1  0  Worry too much - different things 1 2 1 1   Trouble relaxing 1 2 1 1   Restless 1 2 1  0  Easily annoyed or irritable 1 2 1 1   Afraid - awful might happen 0 2 1 0  Total GAD 7 Score 6 14 7 4       Review of Systems:   Pertinent items are noted in HPI Denies abnormal vaginal discharge w/ itching/odor/irritation, headaches, visual changes, shortness of breath, chest pain, abdominal pain, severe nausea/vomiting, or problems with urination or bowel movements unless otherwise stated above. Pertinent History Reviewed:  Reviewed past medical,surgical, social, obstetrical and family history.  Reviewed  problem list, medications and allergies. Physical Assessment:   Vitals:   07/20/21 1355  BP: (!) 117/54  Pulse: 85  Weight: 132 lb (59.9 kg)  Body mass index is 21.97 kg/m.        Physical Examination:   General appearance: Well appearing, and in no distress  Mental status: Alert, oriented to person, place, and time  Skin: Warm & dry  Cardiovascular: Normal heart rate noted  Respiratory: Normal respiratory effort, no distress  Abdomen: Soft, gravid, nontender  Pelvic: Cervical exam performed  Dilation: 1.5 Effacement (%): 50 Station: -2  Extremities: Edema: None  Fetal Status: Fetal Heart Rate (bpm): 130 Fundal Height: 36 cm Movement: Present Presentation: Vertex  Chaperone:   No results found for this or any previous visit (from the past 24 hour(s)).  Assessment & Plan:  1) Low-risk pregnancy G2P1001 at [redacted]w[redacted]d with an Estimated Date of Delivery: 08/09/21   2) Dep/anx, declines meds/therapy, will let 08/05/2020 know if changes mind  3) Plans waterbirth> has done class/consent   Meds: No orders of the defined types were placed in this encounter.  Labs/procedures today: SVE  Plan:  Continue routine obstetrical care  Next visit: prefers in person    Reviewed: Preterm labor symptoms and general obstetric precautions including but not limited  to vaginal bleeding, contractions, leaking of fluid and fetal movement were reviewed in detail with the patient.  All questions were answered. Does have home bp cuff. Office bp cuff given: not applicable. Check bp weekly, let us know if consistently >140 and/or >90.  Follow-up: No follow-ups on file.  Future Appointments  Date Time Provider Department Center  07/27/2021  1:50 PM Cheral Marker, CNM [facility]  08/03/2021  1:50 PM Cheral Marker, CNM [facility]    No orders of the defined types were placed in this encounter.  Cheral Marker CNM, [facility] 07/20/2021 2:24 PM

## 2021-07-27 ENCOUNTER — Other Ambulatory Visit: Payer: Self-pay

## 2021-07-27 ENCOUNTER — Ambulatory Visit (INDEPENDENT_AMBULATORY_CARE_PROVIDER_SITE_OTHER): Payer: Medicaid Other | Admitting: Women's Health

## 2021-07-27 ENCOUNTER — Encounter: Payer: Self-pay | Admitting: Women's Health

## 2021-07-27 VITALS — BP 114/74 | HR 75 | Wt 136.0 lb

## 2021-07-27 DIAGNOSIS — Z3483 Encounter for supervision of other normal pregnancy, third trimester: Secondary | ICD-10-CM

## 2021-07-27 NOTE — Patient Instructions (Signed)
Katelyn Martinez, thank you for choosing our office today! We appreciate the opportunity to meet your healthcare needs. You may receive a short survey by mail, e-mail, or through MyChart. If you are happy with your care we would appreciate if you could take just a few minutes to complete the survey questions. We read all of your comments and take your feedback very seriously. Thank you again for choosing our office.  [facility] ([address] [place], [name] [address]) Entrance C, located off of [address] Free 24/7 valet parking   CLASSES: Go to [email or web address] to register for classes (childbirth, breastfeeding, waterbirth, infant CPR, daddy bootcamp, etc.)  Call the office ([phone]) or go to [facility] if: You begin to have strong, frequent contractions Your water breaks.  Sometimes it is a big gush of fluid, sometimes it is just a trickle that keeps getting your panties wet or running down your legs You have vaginal bleeding.  It is normal to have a small amount of spotting if your cervix was checked.  You don't feel your baby moving like normal.  If you don't, get you something to eat and drink and lay down and focus on feeling your baby move.   If your baby is still not moving like normal, you should call the office or go to [facility].  Call the office ([phone]) or go to [facility] for these signs of pre-eclampsia: Severe headache that does not go away with Tylenol Visual changes- seeing spots, double, blurred vision Pain under your right breast or upper abdomen that does not go away with Tums or heartburn medicine Nausea and/or vomiting Severe swelling in your hands, feet, and face   [name] Pediatricians/Family Doctors [facility] ([facility]): [address], [phone]           [facility]: [address], [phone]                 [facility] ([facility]): [address], [phone] (call to ask if accepting patients) [facility]: [address], [place], [phone]    [place] Pediatricians/Family Doctors [facility] ([facility]): [address], [phone] [facility]: [address], [phone] [facility]: [address], [phone]  [place] [facility]  [facility] ([facility]): [phone] [facility]: [address], [phone]   [place] [facility]: [address], [phone]  [place] [facility]  [facility]: [address], [phone]  Home Blood Pressure Monitoring for Patients   Your provider has recommended that you check your blood pressure (BP) at least once a week at home. If you do not have a blood pressure cuff at home, one will be provided for you. Contact your provider if you have not received your monitor within 1 week.   Helpful Tips for Accurate Home Blood Pressure Checks  Don't smoke, exercise, or drink caffeine 30 minutes before checking your BP Use the restroom before checking your BP (a full bladder can raise your pressure) Relax in a comfortable upright chair Feet on the ground Left arm resting comfortably on a flat surface at the level of your heart Legs uncrossed Back supported Sit quietly and don't talk Place the cuff on your bare arm Adjust snuggly, so that only two fingertips   can fit between your skin and the top of the cuff Check 2 readings separated by at least one minute Keep a log of your BP readings For a visual, please reference this diagram: http://ccnc.care/bpdiagram  Provider Name: [facility]     Phone: [phone]  Zone 1: ALL CLEAR  Continue to monitor your symptoms:  BP reading is less than 140 (top number) or less than 90 (bottom number)  No right  upper stomach pain No headaches or seeing spots No feeling nauseated or throwing up No swelling in face and hands  Zone 2: CAUTION Call your doctor's office for any of the following:  BP reading is greater than 140 (top number) or greater than 90 (bottom number)  Stomach pain under your ribs in the middle or right side Headaches or seeing spots Feeling nauseated or throwing up Swelling in face and hands  Zone 3: EMERGENCY  Seek immediate medical care if you have any of the following:  BP reading is greater than160 (top number) or greater than 110 (bottom number) Severe headaches not improving with Tylenol Serious difficulty catching your breath Any worsening symptoms from Zone 2   Braxton Hicks Contractions Contractions of the uterus can occur throughout pregnancy, but they are not always a sign that you are in labor. You may have practice contractions called Braxton Hicks contractions. These false labor contractions are sometimes confused with true labor. What are Katelyn Martinez contractions? Braxton Hicks contractions are tightening movements that occur in the muscles of the uterus before labor. Unlike true labor contractions, these contractions do not result in opening (dilation) and thinning of the cervix. Toward the end of pregnancy (32-34 weeks), Braxton Hicks contractions can happen more often and may become stronger. These contractions are sometimes difficult to tell apart from true labor because they can be very uncomfortable. You should not feel embarrassed if you go to the hospital with false labor. Sometimes, the only way to tell if you are in true labor is for your health care provider to look for changes in the cervix. The health care provider will do a physical exam and may monitor your contractions. If you are not in true labor, the exam should show that your cervix is not dilating and your water has not broken. If there are no other health problems associated with your  pregnancy, it is completely safe for you to be sent home with false labor. You may continue to have Braxton Hicks contractions until you go into true labor. How to tell the difference between true labor and false labor True labor Contractions last 30-70 seconds. Contractions become very regular. Discomfort is usually felt in the top of the uterus, and it spreads to the lower abdomen and low back. Contractions do not go away with walking. Contractions usually become more intense and increase in frequency. The cervix dilates and gets thinner. False labor Contractions are usually shorter and not as strong as true labor contractions. Contractions are usually irregular. Contractions are often felt in the front of the lower abdomen and in the groin. Contractions may go away when you walk around or change positions while lying down. Contractions get weaker and are shorter-lasting as time goes on. The cervix usually does not dilate or become thin. Follow these instructions at home:  Take over-the-counter and prescription medicines only as told by your health care provider. Keep up with your usual exercises and follow other instructions from your health care provider. Eat and drink lightly if you think  you are going into labor. If Braxton Hicks contractions are making you uncomfortable: Change your position from lying down or resting to walking, or change from walking to resting. Sit and rest in a tub of warm water. Drink enough fluid to keep your urine pale yellow. Dehydration may cause these contractions. Do slow and deep breathing several times an hour. Keep all follow-up prenatal visits as told by your health care provider. This is important. Contact a health care provider if: You have a fever. You have continuous pain in your abdomen. Get help right away if: Your contractions become stronger, more regular, and closer together. You have fluid leaking or gushing from your vagina. You pass  blood-tinged mucus (bloody show). You have bleeding from your vagina. You have low back pain that you never had before. You feel your baby's head pushing down and causing pelvic pressure. Your baby is not moving inside you as much as it used to. Summary Contractions that occur before labor are called Braxton Hicks contractions, false labor, or practice contractions. Braxton Hicks contractions are usually shorter, weaker, farther apart, and less regular than true labor contractions. True labor contractions usually become progressively stronger and regular, and they become more frequent. Manage discomfort from [facility] contractions by changing position, resting in a warm bath, drinking plenty of water, or practicing deep breathing. This information is not intended to replace advice given to you by your health care provider. Make sure you discuss any questions you have with your health care provider. Document Revised: 08/18/2017 Document Reviewed: 01/19/2017 Elsevier Patient Education  [place].

## 2021-07-27 NOTE — Progress Notes (Signed)
LOW-RISK PREGNANCY VISIT Patient name: Katelyn Martinez MRN [id number]  Date of birth: 01-11-1996 Chief Complaint:   Routine Prenatal Visit  History of Present Illness:   Katelyn Martinez is a 25 y.o. G65P1001 female at [redacted]w[redacted]d with an Estimated Date of Delivery: 08/09/21 being seen today for ongoing management of a low-risk pregnancy.   Today she reports  miserable, everything hurts . Contractions: Irregular. Vag. Bleeding: None.  Movement: Present. denies leaking of fluid.  Depression screen [facility] 2/9 05/12/2021 01/27/2021 08/03/2020 05/15/2020 12/15/2016  Decreased Interest 0 2 1 3  0  Down, Depressed, Hopeless 0 2 1 1  0  PHQ - 2 Score 0 4 2 4  0  Altered sleeping 1 2 1 3 2   Tired, decreased energy 1 2 1 3 1   Change in appetite 0 2 1 0 0  Feeling bad or failure about yourself  0 2 1 0 0  Trouble concentrating 0 2 1 0 0  Moving slowly or fidgety/restless 0 2 1 0 0  Suicidal thoughts 0 2 0 0 0  PHQ-9 Score 2 18 8 10 3   Difficult doing work/chores - - - Not difficult at all -     GAD 7 : Generalized Anxiety Score 05/12/2021 01/27/2021 08/03/2020 05/15/2020  Nervous, Anxious, on Edge 1 2 1 1   Control/stop worrying 1 2 1  0  Worry too much - different things 1 2 1 1   Trouble relaxing 1 2 1 1   Restless 1 2 1  0  Easily annoyed or irritable 1 2 1 1   Afraid - awful might happen 0 2 1 0  Total GAD 7 Score 6 14 7 4       Review of Systems:   Pertinent items are noted in HPI Denies abnormal vaginal discharge w/ itching/odor/irritation, headaches, visual changes, shortness of breath, chest pain, abdominal pain, severe nausea/vomiting, or problems with urination or bowel movements unless otherwise stated above. Pertinent History Reviewed:  Reviewed past medical,surgical, social, obstetrical and family history.  Reviewed problem list, medications and allergies. Physical Assessment:   Vitals:   07/27/21 1353  BP: 114/74  Pulse: 75  Weight: 136 lb (61.7 kg)  Body mass index is 22.63  kg/m.        Physical Examination:   General appearance: Well appearing, and in no distress  Mental status: Alert, oriented to person, place, and time  Skin: Warm & dry  Cardiovascular: Normal heart rate noted  Respiratory: Normal respiratory effort, no distress  Abdomen: Soft, gravid, nontender  Pelvic: Cervical exam performed  Dilation: 1.5 Effacement (%): 50 Station: -2  Extremities: Edema: None  Fetal Status: Fetal Heart Rate (bpm): 143 Fundal Height: 36 cm Movement: Present Presentation: Vertex  Chaperone: Latisha Cresenzo   No results found for this or any previous visit (from the past 24 hour(s)).  Assessment & Plan:  1) Low-risk pregnancy G2P1001 at [redacted]w[redacted]d with an Estimated Date of Delivery: 08/09/21   2) Wants waterbirth, class/consent done   Meds: No orders of the defined types were placed in this encounter.  Labs/procedures today: SVE  Plan:  Continue routine obstetrical care  Next visit: prefers in person    Reviewed: Term labor symptoms and general obstetric precautions including but not limited to vaginal bleeding, contractions, leaking of fluid and fetal movement were reviewed in detail with the patient.  All questions were answered. Does have home bp cuff. Office bp cuff given: not applicable. Check bp weekly, let 03/29/2021 know if consistently >140 and/or >90.  Follow-up:  Return for weekly, CNM, in person.  Future Appointments  Date Time Provider Department Center  08/03/2021  1:50 PM Cheral Marker, CNM [facility]    No orders of the defined types were placed in this encounter.  Cheral Marker CNM, [facility] 07/27/2021 2:27 PM

## 2021-08-01 ENCOUNTER — Inpatient Hospital Stay (HOSPITAL_COMMUNITY): Payer: Medicaid Other | Admitting: Anesthesiology

## 2021-08-01 ENCOUNTER — Inpatient Hospital Stay (HOSPITAL_COMMUNITY)
Admission: AD | Admit: 2021-08-01 | Discharge: 2021-08-03 | DRG: 807 | Disposition: A | Discharge status: Home / Self Care | Payer: Medicaid Other | Attending: Family Medicine | Admitting: Family Medicine

## 2021-08-01 ENCOUNTER — Encounter (HOSPITAL_COMMUNITY): Payer: Self-pay | Admitting: Obstetrics and Gynecology

## 2021-08-01 ENCOUNTER — Other Ambulatory Visit: Payer: Self-pay

## 2021-08-01 DIAGNOSIS — Z3480 Encounter for supervision of other normal pregnancy, unspecified trimester: Secondary | ICD-10-CM | POA: Diagnosis not present

## 2021-08-01 DIAGNOSIS — O09299 Supervision of pregnancy with other poor reproductive or obstetric history, unspecified trimester: Secondary | ICD-10-CM

## 2021-08-01 DIAGNOSIS — F32A Depression, unspecified: Secondary | ICD-10-CM | POA: Diagnosis present

## 2021-08-01 DIAGNOSIS — O9902 Anemia complicating childbirth: Secondary | ICD-10-CM | POA: Diagnosis present

## 2021-08-01 DIAGNOSIS — Z87891 Personal history of nicotine dependence: Secondary | ICD-10-CM

## 2021-08-01 DIAGNOSIS — O4202 Full-term premature rupture of membranes, onset of labor within 24 hours of rupture: Secondary | ICD-10-CM | POA: Diagnosis not present

## 2021-08-01 DIAGNOSIS — O4292 Full-term premature rupture of membranes, unspecified as to length of time between rupture and onset of labor: Secondary | ICD-10-CM | POA: Diagnosis not present

## 2021-08-01 DIAGNOSIS — O26893 Other specified pregnancy related conditions, third trimester: Secondary | ICD-10-CM | POA: Diagnosis not present

## 2021-08-01 DIAGNOSIS — F419 Anxiety disorder, unspecified: Secondary | ICD-10-CM | POA: Diagnosis present

## 2021-08-01 DIAGNOSIS — Z20822 Contact with and (suspected) exposure to covid-19: Secondary | ICD-10-CM | POA: Diagnosis not present

## 2021-08-01 DIAGNOSIS — D509 Iron deficiency anemia, unspecified: Secondary | ICD-10-CM | POA: Diagnosis present

## 2021-08-01 DIAGNOSIS — Z3A38 38 weeks gestation of pregnancy: Secondary | ICD-10-CM

## 2021-08-01 LAB — CBC
HCT: 31 % — ABNORMAL LOW (ref 36.0–46.0)
Hemoglobin: 10.1 g/dL — ABNORMAL LOW (ref 12.0–15.0)
MCH: 26 pg (ref 26.0–34.0)
MCHC: 32.6 g/dL (ref 30.0–36.0)
MCV: 79.9 fL — ABNORMAL LOW (ref 80.0–100.0)
Platelets: 234 10*3/uL (ref 150–400)
RBC: 3.88 MIL/uL (ref 3.87–5.11)
RDW: 12.6 % (ref 11.5–15.5)
WBC: 10.5 10*3/uL (ref 4.0–10.5)
nRBC: 0 % (ref 0.0–0.2)

## 2021-08-01 LAB — RPR: RPR Ser Ql: NONREACTIVE

## 2021-08-01 LAB — RESP PANEL BY RT-PCR (FLU A&B, COVID) ARPGX2
Influenza A by PCR: NEGATIVE
Influenza B by PCR: NEGATIVE
SARS Coronavirus 2 by RT PCR: NEGATIVE

## 2021-08-01 LAB — TYPE AND SCREEN
ABO/RH(D): O POS
Antibody Screen: NEGATIVE

## 2021-08-01 MED ORDER — PHENYLEPHRINE 40 MCG/ML (10ML) SYRINGE FOR IV PUSH (FOR BLOOD PRESSURE SUPPORT): 80.0000 ug | PREFILLED_SYRINGE | INTRAVENOUS | Status: DC | PRN

## 2021-08-01 MED ORDER — MEASLES, MUMPS & RUBELLA VAC IJ SOLR: 0.5000 mL | Freq: Once | INTRAMUSCULAR | Status: DC

## 2021-08-01 MED ORDER — LACTATED RINGERS IV SOLN: INTRAVENOUS | Status: DC

## 2021-08-01 MED ORDER — ONDANSETRON HCL 4 MG PO TABS: 4.0000 mg | ORAL_TABLET | ORAL | Status: DC | PRN

## 2021-08-01 MED ORDER — SENNOSIDES-DOCUSATE SODIUM 8.6-50 MG PO TABS
2.0000 | ORAL_TABLET | Freq: Every day | ORAL | Status: DC
  Administered 2021-08-02 – 2021-08-03 (×2): 2 via ORAL
  Filled 2021-08-01 (×2): qty 2

## 2021-08-01 MED ORDER — LACTATED RINGERS IV SOLN: 500.0000 mL | INTRAVENOUS | Status: DC | PRN

## 2021-08-01 MED ORDER — OXYTOCIN BOLUS FROM INFUSION
333.0000 mL | Freq: Once | INTRAVENOUS | Status: AC
  Administered 2021-08-01: 333 mL via INTRAVENOUS

## 2021-08-01 MED ORDER — ONDANSETRON HCL 4 MG/2ML IJ SOLN
4.0000 mg | Freq: Four times a day (QID) | INTRAMUSCULAR | Status: DC | PRN
  Administered 2021-08-01: 4 mg via INTRAVENOUS
  Filled 2021-08-01: qty 2

## 2021-08-01 MED ORDER — OXYCODONE-ACETAMINOPHEN 5-325 MG PO TABS: 2.0000 | ORAL_TABLET | ORAL | Status: DC | PRN

## 2021-08-01 MED ORDER — LIDOCAINE HCL (PF) 1 % IJ SOLN
INTRAMUSCULAR | Status: DC | PRN
  Administered 2021-08-01 (×2): 4 mL via EPIDURAL

## 2021-08-01 MED ORDER — WITCH HAZEL-GLYCERIN EX PADS: 1.0000 "application " | MEDICATED_PAD | CUTANEOUS | Status: DC | PRN

## 2021-08-01 MED ORDER — FENTANYL-BUPIVACAINE-NACL 0.5-0.125-0.9 MG/250ML-% EP SOLN
EPIDURAL | Status: AC
  Filled 2021-08-01: qty 250

## 2021-08-01 MED ORDER — DIPHENHYDRAMINE HCL 25 MG PO CAPS: 25.0000 mg | ORAL_CAPSULE | Freq: Four times a day (QID) | ORAL | Status: DC | PRN

## 2021-08-01 MED ORDER — OXYCODONE-ACETAMINOPHEN 5-325 MG PO TABS: 1.0000 | ORAL_TABLET | ORAL | Status: DC | PRN

## 2021-08-01 MED ORDER — ACETAMINOPHEN 325 MG PO TABS
650.0000 mg | ORAL_TABLET | ORAL | Status: DC | PRN
  Filled 2021-08-01: qty 2

## 2021-08-01 MED ORDER — ONDANSETRON HCL 4 MG/2ML IJ SOLN: 4.0000 mg | INTRAMUSCULAR | Status: DC | PRN

## 2021-08-01 MED ORDER — BENZOCAINE-MENTHOL 20-0.5 % EX AERO
1.0000 "application " | INHALATION_SPRAY | CUTANEOUS | Status: DC | PRN
  Administered 2021-08-02: 1 via TOPICAL
  Filled 2021-08-01: qty 56

## 2021-08-01 MED ORDER — LACTATED RINGERS IV SOLN: 500.0000 mL | Freq: Once | INTRAVENOUS | Status: DC

## 2021-08-01 MED ORDER — OXYTOCIN-SODIUM CHLORIDE 30-0.9 UT/500ML-% IV SOLN
2.5000 [IU]/h | INTRAVENOUS | Status: DC
  Administered 2021-08-01: 2.5 [IU]/h via INTRAVENOUS
  Filled 2021-08-01: qty 500

## 2021-08-01 MED ORDER — IBUPROFEN 600 MG PO TABS
600.0000 mg | ORAL_TABLET | Freq: Four times a day (QID) | ORAL | Status: DC
  Administered 2021-08-02 – 2021-08-03 (×7): 600 mg via ORAL
  Filled 2021-08-01 (×7): qty 1

## 2021-08-01 MED ORDER — COCONUT OIL OIL: 1.0000 "application " | TOPICAL_OIL | Status: DC | PRN

## 2021-08-01 MED ORDER — DIBUCAINE (PERIANAL) 1 % EX OINT: 1.0000 "application " | TOPICAL_OINTMENT | CUTANEOUS | Status: DC | PRN

## 2021-08-01 MED ORDER — EPHEDRINE 5 MG/ML INJ: 10.0000 mg | INTRAVENOUS | Status: DC | PRN

## 2021-08-01 MED ORDER — DIPHENHYDRAMINE HCL 50 MG/ML IJ SOLN: 12.5000 mg | INTRAMUSCULAR | Status: DC | PRN

## 2021-08-01 MED ORDER — FENTANYL-BUPIVACAINE-NACL 0.5-0.125-0.9 MG/250ML-% EP SOLN
EPIDURAL | Status: DC | PRN
  Administered 2021-08-01: 12 mL/h via EPIDURAL

## 2021-08-01 MED ORDER — MEDROXYPROGESTERONE ACETATE 150 MG/ML IM SUSP: 150.0000 mg | INTRAMUSCULAR | Status: DC | PRN

## 2021-08-01 MED ORDER — SIMETHICONE 80 MG PO CHEW: 80.0000 mg | CHEWABLE_TABLET | ORAL | Status: DC | PRN

## 2021-08-01 MED ORDER — PHENYLEPHRINE 40 MCG/ML (10ML) SYRINGE FOR IV PUSH (FOR BLOOD PRESSURE SUPPORT)
PREFILLED_SYRINGE | INTRAVENOUS | Status: AC
  Filled 2021-08-01: qty 10

## 2021-08-01 MED ORDER — PRENATAL MULTIVITAMIN CH
1.0000 | ORAL_TABLET | Freq: Every day | ORAL | Status: DC
  Administered 2021-08-02 – 2021-08-03 (×2): 1 via ORAL
  Filled 2021-08-01 (×2): qty 1

## 2021-08-01 MED ORDER — LIDOCAINE HCL (PF) 1 % IJ SOLN: 30.0000 mL | INTRAMUSCULAR | Status: DC | PRN

## 2021-08-01 MED ORDER — FENTANYL CITRATE (PF) 100 MCG/2ML IJ SOLN
50.0000 ug | INTRAMUSCULAR | Status: DC | PRN
  Administered 2021-08-01 (×2): 100 ug via INTRAVENOUS
  Filled 2021-08-01 (×2): qty 2

## 2021-08-01 MED ORDER — FENTANYL-BUPIVACAINE-NACL 0.5-0.125-0.9 MG/250ML-% EP SOLN: 12.0000 mL/h | EPIDURAL | Status: DC | PRN

## 2021-08-01 MED ORDER — TETANUS-DIPHTH-ACELL PERTUSSIS 5-2.5-18.5 LF-MCG/0.5 IM SUSY: 0.5000 mL | PREFILLED_SYRINGE | Freq: Once | INTRAMUSCULAR | Status: DC

## 2021-08-01 MED ORDER — FAMOTIDINE IN NACL 20-0.9 MG/50ML-% IV SOLN
20.0000 mg | Freq: Once | INTRAVENOUS | Status: AC
  Administered 2021-08-01: 20 mg via INTRAVENOUS
  Filled 2021-08-01: qty 50

## 2021-08-01 MED ORDER — SOD CITRATE-CITRIC ACID 500-334 MG/5ML PO SOLN: 30.0000 mL | ORAL | Status: DC | PRN

## 2021-08-01 MED ORDER — ACETAMINOPHEN 325 MG PO TABS: 650.0000 mg | ORAL_TABLET | ORAL | Status: DC | PRN

## 2021-08-01 NOTE — H&P (Signed)
OBSTETRIC ADMISSION HISTORY AND PHYSICAL  Katelyn Martinez is a 25 y.o. female G2P1001 with IUP at [redacted]w[redacted]d presenting for SROM with SOL. She reports +FMs, No LOF, no VB, no blurry vision, headaches or peripheral edema, and RUQ pain.  She plans on breast feeding. She request Phexxi for birth control. 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    Nexplanon insertion 04/03/2018   Inserted right arm 04/03/18   Recurrent upper respiratory infection (URI)    Trauma 12/22/2014   feel at [facility] and had skull Fracture     Past Surgical History: Past Surgical History:  Procedure Laterality Date   BLADDER SURGERY     uteral reimplantation   ORIF TIBIA PLATEAU Right 12/29/2014   Procedure: OPEN REDUCTION INTERNAL FIXATION (ORIF) TIBIAL PLATEAU;  Surgeon: 02/28/2015, MD;  Location: [facility] OR;  Service: Orthopedics;  Laterality: Right;   SALIVARY GLAND SURGERY     TONSILLECTOMY      Obstetrical History: OB History     Gravida  2   Para  1   Term  1   Preterm      AB      Living  1      SAB      IAB      Ectopic      Multiple  0   Live Births  1           Social History Social History   Socioeconomic History   Marital status: Single    Spouse name: Not on file   Number of children: 1   Years of education: Not on file   Highest education level: Not on file  Occupational History   Not on file  Tobacco Use   Smoking status: Former    Types: Cigarettes    Passive exposure: Yes   Smokeless tobacco: Never  Vaping Use   Vaping Use: Never used  Substance and Sexual Activity   Alcohol use: Not Currently    Comment:  socially   Drug use: Not Currently    Frequency: 7.0 times per week    Types: Marijuana   Sexual activity: Yes    Birth control/protection: None    Comment: over s month  Other Topics Concern   Not on file  Social History Narrative   Not on file   Social Determinants of Health   Financial Resource Strain: Low Risk    Difficulty of Paying Living Expenses: Not hard at all  Food Insecurity: No Food Insecurity   Worried About Sheral Apley in the Last Year: Never true   Ran Out of Food in the Last Year: Never true  Transportation Needs: No Transportation Needs   Lack of Transportation (Medical): No   Lack of Transportation (Non-Medical): No  Physical Activity: Inactive   Days of Exercise per Week: 0 days   Minutes of Exercise per Session: 0 min  Stress: No Stress Concern Present   Feeling of Stress : Only a little  Social Connections: Moderately Isolated   Frequency of Communication with Friends and Family: More than three times  a week   Frequency of Social Gatherings with Friends and Family: Three times a week   Attends Religious Services: Never   Active Member of Clubs or Organizations: No   Attends Engineer, structural: Never   Marital Status: Living with partner    Family History: Family History  Problem Relation Age of Onset   Ovarian cysts Mother    Endometriosis Mother    Fibromyalgia Mother    Hepatitis C Mother    Diabetes Mother    Cancer Maternal Aunt        ovarian   Cervical cancer Maternal Aunt    Cancer Maternal Grandfather        bladder   Cancer Paternal Grandmother        lung   Bipolar disorder Father    Suicidality Paternal Grandfather    Cancer Maternal Grandmother    Other Sister        mood disorder; patella femoral syndrome    Allergies: Not on File  Medications Prior to Admission  Medication Sig Dispense Refill Last Dose   Blood Pressure Monitor MISC For regular home bp monitoring during pregnancy (Patient not taking:  No sig reported) 1 each 0    cyclobenzaprine (FLEXERIL) 5 MG tablet Take 1 tablet (5 mg total) by mouth 3 (three) times daily as needed for muscle spasms. (Patient not taking: No sig reported) 20 tablet 0 More than a month     Review of Systems   All systems reviewed and negative except as stated in HPI  Blood pressure 116/76, pulse 79, temperature 97.6 F (36.4 C), temperature source Oral, resp. rate 18, height 5\' 5"  (1.651 m), weight 63 kg, last menstrual period 11/02/2020, SpO2 100 %. General appearance: alert, cooperative, and no distress Lungs: clear to auscultation bilaterally Heart: regular rate and rhythm Abdomen: soft, non-tender; bowel sounds normal Pelvic:  Extremities: Homans sign is negative, no sign of DVT DTR's wnl Presentation: cephalic Fetal monitoringBaseline: 135 bpm Uterine activityFrequency: Every 2-8 minutes Dilation: 2 Effacement (%): 50 Station: -2 Exam by:: [place] RN   Prenatal labs: ABO, Rh: --/--/O POS (11/13 0505) Antibody: NEG (11/13 0505) Rubella: 2.07 (05/13 1412) RPR: Non Reactive (08/24 0846)  HBsAg: Negative (05/13 1412)  HIV: Non Reactive (08/24 0846)  GBS: Negative/-- (10/25 0000)  2 hr Glucola wnl 64, 91, 86 Genetic screening  low risk NIPS Anatomy [birth year]1982 wnl  Prenatal Transfer Tool  Maternal Diabetes: No Genetic Screening: Normal Maternal Ultrasounds/Referrals: Normal Fetal Ultrasounds or other Referrals:  None Maternal Substance Abuse:  No Significant Maternal Medications:  None Significant Maternal Lab Results: Group B Strep negative  Results for orders placed or performed during the hospital encounter of 08/01/21 (from the past 24 hour(s))  Resp Panel by RT-PCR (Flu A&B, Covid) Nasopharyngeal Swab   Collection Time: 08/01/21  5:05 AM   Specimen: Nasopharyngeal Swab; Nasopharyngeal(NP) swabs in vial transport medium  Result Value Ref Range   SARS Coronavirus 2 by RT PCR NEGATIVE NEGATIVE   Influenza A by PCR NEGATIVE NEGATIVE    Influenza B by PCR NEGATIVE NEGATIVE  CBC   Collection Time: 08/01/21  5:05 AM  Result Value Ref Range   WBC 10.5 4.0 - 10.5 K/uL   RBC 3.88 3.87 - 5.11 MIL/uL   Hemoglobin 10.1 (L) 12.0 - 15.0 g/dL   HCT 08/03/21 (L) 31.5 - 17.6 %   MCV 79.9 (L) 80.0 - 100.0 fL   MCH 26.0 26.0 - 34.0 pg   MCHC 32.6 30.0 -  36.0 g/dL   RDW 26.2 03.5 - 59.7 %   Platelets 234 150 - 400 K/uL   nRBC 0.0 0.0 - 0.2 %  Type and screen [facility]   Collection Time: 08/01/21  5:05 AM  Result Value Ref Range   ABO/RH(D) O POS    Antibody Screen NEG    Sample Expiration      08/04/2021,2359 Performed at [facility], [address], [place], [place] [address]     Patient Active Problem List   Diagnosis Date Noted   Indication for care in labor or delivery 08/01/2021   Encounter for supervision of normal pregnancy, antepartum 01/27/2021   History of gestational diabetes in prior pregnancy, currently pregnant 01/27/2021   Anxiety and depression 05/15/2020   Asthma 12/24/2014   Intracranial hemorrhage (HCC)     Assessment/Plan:  Katelyn Martinez is a 25 y.o. G2P1001 at [redacted]w[redacted]d here for SROM/SOL  #Labor:expectant management #Pain: Plans waterbirth #FWB: Category I in MAU, may use intermittent auscultation  #ID:  GBS neg #MOF: breast #MOC:Phexxi #Circ:  Outpatient at [facility], CNM  08/01/2021, 8:12 AM

## 2021-08-01 NOTE — Progress Notes (Signed)
Katelyn Martinez is a 25 y.o. G2P1001 at [redacted]w[redacted]d.  Subjective: Comfortable w/ epidural.   Objective: BP 109/68   Pulse 84   Temp (!) 97.4 F (36.3 C) (Axillary)   Resp 18   Ht 5\' 5"  (1.651 m)   Wt 63 kg   LMP 11/02/2020 (Within Days)   SpO2 100%   BMI 23.13 kg/m    FHT:  FHR: 120 bpm, variability: mod,  accelerations:  15x15,  decelerations:  earlies, few variables,  UC:   Q 4-5  minutes, strong Dilation: 7 Effacement (%): 80 Cervical Position: Middle Station: 0 Presentation: Vertex Exam by:: [email or web address] RN  Labs: NA  Assessment / Plan: [redacted]w[redacted]d week IUP Labor: Transition. Progressing well.  Fetal Wellbeing:  Category I-II, overall reassuring Pain Control:  Epidural Anticipated MOD:  SVD  [redacted]w[redacted]d [name], CNM 08/01/2021 5:30 PM

## 2021-08-01 NOTE — Plan of Care (Signed)
  Problem: Education: Goal: Knowledge of General Education information will improve Description: Including pain rating scale, medication(s)/side effects and non-pharmacologic comfort measures Outcome: Progressing   Problem: Health Behavior/Discharge Planning: Goal: Ability to manage health-related needs will improve Outcome: Progressing   Problem: Clinical Measurements: Goal: Ability to maintain clinical measurements within normal limits will improve Outcome: Progressing Goal: Will remain free from infection Outcome: Progressing Goal: Diagnostic test results will improve Outcome: Progressing Goal: Respiratory complications will improve Outcome: Progressing Goal: Cardiovascular complication will be avoided Outcome: Progressing   Problem: Activity: Goal: Risk for activity intolerance will decrease Outcome: Progressing   Problem: Nutrition: Goal: Adequate nutrition will be maintained Outcome: Progressing   Problem: Coping: Goal: Level of anxiety will decrease Outcome: Progressing   Problem: Elimination: Goal: Will not experience complications related to bowel motility Outcome: Progressing Goal: Will not experience complications related to urinary retention Outcome: Progressing   Problem: Elimination: Goal: Will not experience complications related to urinary retention Outcome: Progressing   Problem: Pain Managment: Goal: General experience of comfort will improve Outcome: Progressing   Problem: Safety: Goal: Ability to remain free from injury will improve Outcome: Progressing   Problem: Skin Integrity: Goal: Risk for impaired skin integrity will decrease Outcome: Progressing   Problem: Education: Goal: Knowledge of Childbirth will improve Outcome: Progressing Goal: Ability to make informed decisions regarding treatment and plan of care will improve Outcome: Progressing Goal: Ability to state and carry out methods to decrease the pain will improve Outcome:  Progressing Goal: Individualized Educational Video(s) Outcome: Progressing   Problem: Coping: Goal: Ability to verbalize concerns and feelings about labor and delivery will improve Outcome: Progressing   Problem: Life Cycle: Goal: Ability to make normal progression through stages of labor will improve Outcome: Progressing Goal: Ability to effectively push during vaginal delivery will improve Outcome: Progressing   Problem: Role Relationship: Goal: Will demonstrate positive interactions with the child Outcome: Progressing   Problem: Safety: Goal: Risk of complications during labor and delivery will decrease Outcome: Progressing   Problem: Pain Management: Goal: Relief or control of pain from uterine contractions will improve Outcome: Progressing   Problem: Education: Goal: Ability to demonstrate appropriate child care will improve Outcome: Progressing Goal: Ability to verbalize an understanding of newborn treatment and procedures will improve Outcome: Progressing Goal: Ability to demonstrate an understanding of appropriate nutrition and feeding will improve Outcome: Progressing Goal: Individualized Educational Video(s) Outcome: Progressing   Problem: Nutritional: Goal: Nutritional status of the infant will improve as evidenced by minimal weight loss and appropriate weight gain for gestational age Outcome: Progressing Goal: Ability to maintain a balanced intake and output will improve Outcome: Progressing   Problem: Clinical Measurements: Goal: Ability to maintain clinical measurements within normal limits will improve Outcome: Progressing   Problem: Skin Integrity: Goal: Risk for impaired skin integrity will decrease Outcome: Progressing Goal: Demonstrates signs of wound healing without infection Outcome: Progressing

## 2021-08-01 NOTE — Anesthesia Procedure Notes (Signed)
Epidural Patient location during procedure: OB Start time: 08/01/2021 5:17 PM End time: 08/01/2021 5:20 PM  Staffing Anesthesiologist: Kaylyn Layer, MD Performed: anesthesiologist   Preanesthetic Checklist Completed: patient identified, IV checked, risks and benefits discussed, monitors and equipment checked, pre-op evaluation and timeout performed  Epidural Patient position: sitting Prep: DuraPrep and site prepped and draped Patient monitoring: continuous pulse ox, blood pressure and heart rate Approach: midline Location: L3-L4 Injection technique: LOR air  Needle:  Needle type: Tuohy  Needle gauge: 17 G Needle length: 9 cm Needle insertion depth: 4 cm Catheter type: closed end flexible Catheter size: 19 Gauge Catheter at skin depth: 9 cm Test dose: negative and Other (1% lidocaine)  Assessment Events: blood not aspirated, injection not painful, no injection resistance, no paresthesia and negative IV test  Additional Notes Patient identified. Risks, benefits, and alternatives discussed with patient including but not limited to bleeding, infection, nerve damage, paralysis, failed block, incomplete pain control, headache, blood pressure changes, nausea, vomiting, reactions to medication, itching, and postpartum back pain. Confirmed with bedside nurse the patient's most recent platelet count. Confirmed with patient that they are not currently taking any anticoagulation, have any bleeding history, or any family history of bleeding disorders. Patient expressed understanding and wished to proceed. All questions were answered. Sterile technique was used throughout the entire procedure. Please see nursing notes for vital signs.   Crisp LOR on first pass. Test dose was given through epidural catheter and negative prior to continuing to dose epidural or start infusion. Warning signs of high block given to the patient including shortness of breath, tingling/numbness in hands, complete  motor block, or any concerning symptoms with instructions to call for help. Patient was given instructions on fall risk and not to get out of bed. All questions and concerns addressed with instructions to call with any issues or inadequate analgesia.  Reason for block:procedure for pain

## 2021-08-01 NOTE — Discharge Summary (Addendum)
Postpartum Discharge Summary  Date of Service updated 08/03/21     Patient Name: Katelyn Martinez DOB: [birth year] MRN: [id number]  Date of admission: 08/01/2021 Delivery date:08/01/2021  Delivering provider: Genia Del  Date of discharge: 08/03/2021  Admitting diagnosis: Indication for care in labor or delivery [O75.9] Intrauterine pregnancy: [redacted]w[redacted]d     Secondary diagnosis:  Principal Problem:   Vaginal delivery Active Problems:   Anxiety and depression   History of gestational diabetes in prior pregnancy, currently pregnant  Additional problems: none    Discharge diagnosis: Term Pregnancy Delivered                                              Post partum procedures: none Augmentation: N/A Complications: None  Hospital course: Onset of Labor With Vaginal Delivery      25 y.o. yo G2P1001 at [redacted]w[redacted]d was admitted in Latent Labor on 08/01/2021 after SROM at home. Patient had an uncomplicated labor and continued to progress well on her own without augmentation. She had an uncomplicated vaginal delivery.   Membrane Rupture Time/Date: 2:00 AM ,08/01/2021   Delivery Method:Vaginal, Spontaneous  Episiotomy: None  Lacerations:  None  Patient had an uncomplicated postpartum course.  She is ambulating, tolerating a regular diet, passing flatus, and urinating well. Patient is discharged home in stable condition on 08/03/21.  Newborn Data: Birth date:08/01/2021  Birth time:9:04 PM  Gender:Female  Living status:Living  Apgars:8 ,9  Weight:3260 g   Magnesium Sulfate received: No BMZ received: No Rhophylac:N/A MMR:N/A - Immune T-DaP:Given prenatally Flu: Yes Transfusion:No  Physical exam  Vitals:   08/02/21 0900 08/02/21 1428 08/02/21 2234 08/03/21 0612  BP: 100/67 111/70 111/73 106/69  Pulse: 67 71 72 63  Resp: $Remo'20 20 18 18  'YknpS$ Temp: 97.8 F (36.6 C) 97.8 F (36.6 C) 98.6 F (37 C) 98.2 F (36.8 C)  TempSrc: Oral Oral Oral Oral  SpO2:   99% 98%  Weight:       Height:       General: alert, cooperative, and no distress Lochia: appropriate Uterine Fundus: firm Incision: N/A DVT Evaluation: No evidence of DVT seen on physical exam. Negative Homan's sign. No cords or calf tenderness. No significant calf/ankle edema.  Labs: Lab Results  Component Value Date   WBC 10.5 08/01/2021   HGB 10.1 (L) 08/01/2021   HCT 31.0 (L) 08/01/2021   MCV 79.9 (L) 08/01/2021   PLT 234 08/01/2021   CMP Latest Ref Rng & Units 12/18/2020  Glucose 70 - 99 mg/dL 106(H)  BUN 6 - 20 mg/dL 8  Creatinine 0.44 - 1.00 mg/dL 0.70  Sodium 135 - 145 mmol/L 136  Potassium 3.5 - 5.1 mmol/L 3.8  Chloride 98 - 111 mmol/L 103  CO2 22 - 32 mmol/L 24  Calcium 8.9 - 10.3 mg/dL 9.0  Total Protein 6.5 - 8.1 g/dL 7.2  Total Bilirubin 0.3 - 1.2 mg/dL 0.7  Alkaline Phos 38 - 126 U/L 43  AST 15 - 41 U/L 18  ALT 0 - 44 U/L 13   Edinburgh Score: Edinburgh Postnatal Depression Scale Screening Tool 08/02/2021  I have been able to laugh and see the funny side of things. 0  I have looked forward with enjoyment to things. 0  I have blamed myself unnecessarily when things went wrong. 1  I have been anxious or worried for no  good reason. 1  I have felt scared or panicky for no good reason. 0  Things have been getting on top of me. 1  I have been so unhappy that I have had difficulty sleeping. 1  I have felt sad or miserable. 1  I have been so unhappy that I have been crying. 0  The thought of harming myself has occurred to me. 0  Edinburgh Postnatal Depression Scale Total 5     After visit meds:  Allergies as of 08/03/2021   No Known Allergies      Medication List     STOP taking these medications    Blood Pressure Monitor Misc   cyclobenzaprine 5 MG tablet Commonly known as: FLEXERIL       TAKE these medications    acetaminophen 500 MG tablet Commonly known as: TYLENOL Take 2 tablets (1,000 mg total) by mouth every 8 (eight) hours.   ibuprofen 600 MG  tablet Commonly known as: ADVIL Take 1 tablet (600 mg total) by mouth every 6 (six) hours.   senna-docusate 8.6-50 MG tablet Commonly known as: Senokot-S Take 2 tablets by mouth at bedtime as needed for mild constipation.         Discharge home in stable condition Infant Feeding: Breast Infant Disposition:home with mother Discharge instruction: per After Visit Summary and Postpartum booklet. Activity: Advance as tolerated. Pelvic rest for 6 weeks.  Diet: routine diet Future Appointments: Future Appointments  Date Time Provider [facility]  09/14/2021 11:10 AM Roma Schanz, CNM [facility]   Follow up Visit:  Follow-up Information     [facility] Follow up on 09/14/2021.   Specialty: Obstetrics and Gynecology Why: for postpartum checkup Contact information: [place] [phone]               Message sent to [facility] by Dr. Gwenlyn Perking on 08/01/21.  Please schedule this patient for a In person postpartum visit in 6 weeks with the following provider: Any provider. Additional Postpartum F/U:  None   Low risk pregnancy complicated by:  None Delivery mode:  Vaginal, Spontaneous  Anticipated Birth Control:  Phexxi   08/03/2021 Araceli Bouche, MD  I personally saw and evaluated the patient, performing the key elements of the service. I developed and verified the management plan that is described in the resident's/student's note, and I agree with the content with my edits above. VSS, HRR&R, Resp unlabored, Legs neg.  Nigel Berthold, CNM 08/04/2021 12:34 PM

## 2021-08-01 NOTE — Progress Notes (Signed)
Katelyn Martinez is a 25 y.o. G2P1001 at [redacted]w[redacted]d.  Subjective: More uncomfortable w/ UC's.   Objective: BP 102/67   Pulse 87   Temp (!) 96.7 F (35.9 C) (Axillary)   Resp 16   Ht 5\' 5"  (1.651 m)   Wt 63 kg   LMP 11/02/2020 (Within Days)   SpO2 100%   BMI 23.13 kg/m    FHT:  FHR: 130 bpm, variability: mod UC:   Q 3-5 minutes, mod Dilation: 3.5 Effacement (%): 70 Cervical Position: Middle Station: -1 Presentation: Vertex Exam by:: [email or web address], CNM  Labs: Results for orders placed or performed during the hospital encounter of 08/01/21 (from the past 24 hour(s))  Resp Panel by RT-PCR (Flu A&B, Covid) Nasopharyngeal Swab     Status: None   Collection Time: 08/01/21  5:05 AM   Specimen: Nasopharyngeal Swab; Nasopharyngeal(NP) swabs in vial transport medium  Result Value Ref Range   SARS Coronavirus 2 by RT PCR NEGATIVE NEGATIVE   Influenza A by PCR NEGATIVE NEGATIVE   Influenza B by PCR NEGATIVE NEGATIVE  CBC     Status: Abnormal   Collection Time: 08/01/21  5:05 AM  Result Value Ref Range   WBC 10.5 4.0 - 10.5 K/uL   RBC 3.88 3.87 - 5.11 MIL/uL   Hemoglobin 10.1 (L) 12.0 - 15.0 g/dL   HCT 08/03/21 (L) 30.0 - 76.2 %   MCV 79.9 (L) 80.0 - 100.0 fL   MCH 26.0 26.0 - 34.0 pg   MCHC 32.6 30.0 - 36.0 g/dL   RDW 26.3 33.5 - 45.6 %   Platelets 234 150 - 400 K/uL   nRBC 0.0 0.0 - 0.2 %  Type and screen [facility]     Status: None   Collection Time: 08/01/21  5:05 AM  Result Value Ref Range   ABO/RH(D) O POS    Antibody Screen NEG    Sample Expiration      08/04/2021,2359 Performed at [facility], [address] [place], [place]   RPR     Status: None   Collection Time: 08/01/21  5:05 AM  Result Value Ref Range   RPR Ser Ql NON REACTIVE NON REACTIVE    Assessment / Plan: [redacted]w[redacted]d week IUP Labor: Early, PROM x 9 hours. No evidence of Chorio. Progressing w/out augmentation. Will continue expectant management.  Fetal Wellbeing:   Intermittent monitoring reassuring.  Pain Control:  Comfort measures. Plans waterbirth. Is a candidate.  Anticipated MOD:  SVD  [redacted]w[redacted]d [name], CNM 08/01/2021 11:30 AM

## 2021-08-01 NOTE — Progress Notes (Signed)
Katelyn Martinez is a 25 y.o. G2P1001 at [redacted]w[redacted]d.  Subjective: More uncomfortable w. UC's. Used hydrotherapy, got out. Used Nitrous. Coping well.   Objective: BP 110/74   Pulse (!) 102   Temp (!) 97.3 F (36.3 C) (Axillary)   Resp 17   Ht 5\' 5"  (1.651 m)   Wt 63 kg   LMP 11/02/2020 (Within Days)   SpO2 100%   BMI 23.13 kg/m    FHT:  FHR: 130 bpm, variability: mod,  accelerations:  15x15,  decelerations:  earlies UC:   Q 4-6 minutes, mod-strong Dilation: 5.5 Effacement (%): 80 Cervical Position: Middle Station: 0 Presentation: Vertex Exam by:: [email or web address] RN  Labs: NA  Assessment / Plan: [redacted]w[redacted]d week IUP Labor: [place]-->Active Fetal Wellbeing:  Category I Pain Control:  Fentanyl.  Anticipated MOD:  SVD  [redacted]w[redacted]d Katelyn Martinez, [place] 08/01/2021 3:55 PM

## 2021-08-01 NOTE — MAU Note (Signed)
Pt reports her water broke about 2 hrs ago. Clear fluid keeps leaking out. Good fetal movement felt. Mild ctx off and on. 1.5cm on Tuesday.

## 2021-08-01 NOTE — Anesthesia Preprocedure Evaluation (Signed)
Anesthesia Evaluation  Patient identified by MRN, date of birth, ID band Patient awake    Reviewed: Allergy & Precautions, Patient's Chart, lab work & pertinent test results  History of Anesthesia Complications Negative for: history of anesthetic complications  Airway Mallampati: II  TM Distance: >3 FB Neck ROM: Full    Dental no notable dental hx.    Pulmonary former smoker,    Pulmonary exam normal        Cardiovascular negative cardio ROS Normal cardiovascular exam     Neuro/Psych  Headaches, Anxiety Depression    GI/Hepatic negative GI ROS, Neg liver ROS,   Endo/Other  negative endocrine ROS  Renal/GU negative Renal ROS  negative genitourinary   Musculoskeletal negative musculoskeletal ROS (+)   Abdominal   Peds  Hematology negative hematology ROS (+)   Anesthesia Other Findings Day of surgery medications reviewed with patient.  Reproductive/Obstetrics (+) Pregnancy                             Anesthesia Physical Anesthesia Plan  ASA: 2  Anesthesia Plan: Epidural   Post-op Pain Management:    Induction:   PONV Risk Score and Plan: Treatment may vary due to age or medical condition  Airway Management Planned: Natural Airway  Additional Equipment:   Intra-op Plan:   Post-operative Plan:   Informed Consent: I have reviewed the patients History and Physical, chart, labs and discussed the procedure including the risks, benefits and alternatives for the proposed anesthesia with the patient or authorized representative who has indicated his/her understanding and acceptance.       Plan Discussed with:   Anesthesia Plan Comments:         Anesthesia Quick Evaluation

## 2021-08-02 ENCOUNTER — Encounter (HOSPITAL_COMMUNITY): Payer: Self-pay | Admitting: Obstetrics and Gynecology

## 2021-08-02 MED ORDER — CALCIUM CARBONATE ANTACID 500 MG PO CHEW
1.0000 | CHEWABLE_TABLET | ORAL | Status: DC | PRN
  Administered 2021-08-02: 200 mg via ORAL
  Filled 2021-08-02: qty 1

## 2021-08-02 NOTE — Progress Notes (Signed)
Post Partum Day 1 Subjective: no complaints, up ad lib, voiding, tolerating PO, and + flatus  Objective: Blood pressure 108/72, pulse 69, temperature 98.3 F (36.8 C), temperature source Oral, resp. rate 18, height 5\' 5"  (1.651 m), weight 63 kg, last menstrual period 11/02/2020, SpO2 98 %, unknown if currently breastfeeding.  Physical Exam:  General: cooperative, fatigued, and no distress Lochia: appropriate Uterine Fundus: firm Incision: n/a DVT Evaluation: No evidence of DVT seen on physical exam. Negative Homan's sign. No cords or calf tenderness. No significant calf/ankle edema.  Recent Labs    08/01/21 0505  HGB 10.1*  HCT 31.0*    Assessment/Plan: Plan for discharge tomorrow Doing well.  #MOF: breast #MOC: Phexxi #Anemia, iron deficiency: Hgb 10.1 on admit. EBL only 50 mL and asymptomatic. Will supplement with oral iron.   LOS: 1 day   08/03/21 08/02/2021, 7:08 AM

## 2021-08-02 NOTE — Social Work (Signed)
CSW received consult for hx of Anxiety and Depression.  CSW met with MOB to offer support and complete assessment.    CSW met MOB at bedside and introduced CSW role. CSW observed MOB sitting up in bed holding the infant. CSW congratulated MOB. MOB reported presented calm and welcomed CSW visit. CSW inquired how MOB has felt since giving birth. MOB reported feeling tired with some cramps but overall doing okay. MOB reported she has a telephone job interview today and has been preparing. CSW praised MOB for her efforts. CSW inquired how MOB felt during the pregnancy. 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 MOB reported feeling anxious during the pregnancy which she discussed with her doctor and was offered Lexapro that she stopped taking because it made her feel "sluggish." MOB is not sure if she will try other medications. MOB shared she was diagnosed with depression and anxiety during this pregnancy however she does not feel it is new. MOB reported she saw a therapist at age 58 but she did not like therapy because she does not like talking about her problems with strangers. MOB reported that she copes by going out and doing things fun with her son. 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 MOB shared a day after she gave birth her father died so she cried often and found it difficult to think about anything positive at the time. MOB is hopeful she will feel much better this time. MOB was receptive to CSW providing education regarding the baby blues period vs. perinatal mood disorders. CSW discussed treatment and gave resources for mental health follow up if concerns arise.  CSW recommends self-evaluation during the postpartum time period using the New Mom Checklist from Postpartum Progress and encouraged MOB to contact a medical professional  if symptoms are noted at any time. MOB reported she feels comfortable reaching out to her doctor if she has concerns. MOB denied SI/HI/DV. MOB identified FOB and her mom as supports.    CSW provided review of Sudden Infant Death Syndrome (SIDS) precautions. MOB reported she has essential items for the infant including a car seat and bassinet where the infant will sleep. MOB has chosen [employer] [facility] in [place] for infant's follow up care and will have transportation to the appointments. CSW assessed MOB for additional needs. MOB reported no further needs.   CSW identifies no further need for intervention and no barriers to discharge at this time.   Kathrin Greathouse, MSW, LCSW Women's and [place] Worker  [phone] 08/02/2021  1:37PM

## 2021-08-02 NOTE — Anesthesia Postprocedure Evaluation (Signed)
Anesthesia Post Note  Patient: Katelyn Martinez  Procedure(s) Performed: AN AD HOC LABOR EPIDURAL     Patient location during evaluation: Mother Baby Anesthesia Type: Epidural Level of consciousness: awake, oriented and awake and alert Pain management: pain level controlled Vital Signs Assessment: post-procedure vital signs reviewed and stable Respiratory status: spontaneous breathing, respiratory function stable and nonlabored ventilation Cardiovascular status: stable Postop Assessment: no headache, adequate PO intake, able to ambulate, patient able to bend at knees, no backache and no apparent nausea or vomiting Anesthetic complications: no   No notable events documented.  Last Vitals:  Vitals:   08/02/21 0002 08/02/21 0527  BP: 123/77 108/72  Pulse: 78 69  Resp: 18 18  Temp: 37.2 C 36.8 C  SpO2:      Last Pain:  Vitals:   08/02/21 0531  TempSrc:   PainSc: 7    Pain Goal: Patients Stated Pain Goal: 0 (08/01/21 0630)                 Marthe Dant

## 2021-08-03 ENCOUNTER — Other Ambulatory Visit (HOSPITAL_COMMUNITY): Payer: Self-pay

## 2021-08-03 ENCOUNTER — Encounter: Payer: Medicaid Other | Admitting: Women's Health

## 2021-08-03 MED ORDER — ACETAMINOPHEN 500 MG PO TABS
1000.0000 mg | ORAL_TABLET | Freq: Three times a day (TID) | ORAL | 0 refills | Status: DC
  Filled 2021-08-03: qty 30, 5d supply, fill #0

## 2021-08-03 MED ORDER — IBUPROFEN 600 MG PO TABS: 600.0000 mg | ORAL_TABLET | Freq: Four times a day (QID) | ORAL | 0 refills | Status: DC

## 2021-08-03 MED ORDER — ACETAMINOPHEN 500 MG PO TABS
1000.0000 mg | ORAL_TABLET | Freq: Three times a day (TID) | ORAL | Status: DC
  Administered 2021-08-03: 1000 mg via ORAL
  Filled 2021-08-03: qty 2

## 2021-08-03 MED ORDER — SENNOSIDES-DOCUSATE SODIUM 8.6-50 MG PO TABS
2.0000 | ORAL_TABLET | Freq: Every evening | ORAL | 0 refills | Status: DC | PRN
  Filled 2021-08-03: qty 30, 15d supply, fill #0

## 2021-08-04 ENCOUNTER — Telehealth: Payer: Self-pay

## 2021-08-04 NOTE — Telephone Encounter (Signed)
Transition Care Management Follow-up Telephone Call Date of discharge and from where: 08/03/2021-[facility]  How have you been since you were released from the hospital? Patient stated she is doing fine. Feeling a little tired Any questions or concerns? No  Items Reviewed: Did the pt receive and understand the discharge instructions provided? Yes  Medications obtained and verified? Yes  Other? No  Any new allergies since your discharge? No  Dietary orders reviewed? No Do you have support at home? Yes   Home Care and Equipment/Supplies: Were home health services ordered? not applicable If so, what is the name of the agency? N/A  Has the agency set up a time to come to the patient's home? not applicable Were any new equipment or medical supplies ordered?  No What is the name of the medical supply agency? N/A Were you able to get the supplies/equipment? not applicable Do you have any questions related to the use of the equipment or supplies? No  Functional Questionnaire: (I = Independent and D = Dependent) ADLs: I  Bathing/Dressing- I  Meal Prep- I  Eating- I  Maintaining continence- I  Transferring/Ambulation- I  Managing Meds- I  Follow up appointments reviewed:  PCP Hospital f/u appt confirmed? No   Specialist Hospital f/u appt confirmed? Yes  Scheduled to see OBGYN on 09/14/2021 @ 11:10AM. Are transportation arrangements needed? No  If their condition worsens, is the pt aware to call PCP or go to the Emergency Dept.? Yes Was the patient provided with contact information for the PCP's office or ED? Yes Was to pt encouraged to call back with questions or concerns? Yes

## 2021-08-13 ENCOUNTER — Telehealth (HOSPITAL_COMMUNITY): Payer: Self-pay | Admitting: *Deleted

## 2021-08-13 NOTE — Telephone Encounter (Signed)
Attempted hospital discharge follow-up call. Left message for patient to return RN call. Deforest Hoyles, RN, 08/13/21, [phone]

## 2021-09-14 ENCOUNTER — Ambulatory Visit: Payer: Medicaid Other | Admitting: Women's Health

## 2021-09-19 NOTE — L&D Delivery Note (Signed)
OB/GYN Faculty Practice Delivery Note  Katelyn Martinez is a 26 y.o. [phone] s/p VD at [redacted]w[redacted]d. She was admitted for SOL.   ROM: 2h 3m with clear fluid GBS Status:  Negative/-- (10/11 1430)  Labor Progress: Initial SVE: 2/60/-3. She then progressed to complete.   Delivery Date/Time: 07/09/22 1353 Delivery: Called to room and patient was complete and pushing. Head delivered ROA. No nuchal cord present. Shoulder and body delivered in usual fashion. Infant with spontaneous cry, placed on mother's abdomen, dried and stimulated. Cord clamped x 2 after 1-minute delay, and cut by FOB. Cord blood drawn. Placenta delivered spontaneously with gentle cord traction. Fundus firm with massage and Pitocin. Labia, perineum, vagina, and cervix inspected with no lacerations noted.    Post-Placental IUD Insertion Procedure Patient identified, informed consent signed prior to delivery, signed copy in chart, time out was performed.   IUD grasped between sterile gloved fingers. Sterile lubrication applied to sterile gloved hand for ease of insertion. Fundus identified through abdominal wall using non-insertion hand. IUD inserted to fundus with bimanual technique. IUD carefully released at the fundus and insertion hand gently removed from vagina.  Strings trimmed to the level of the introitus. Patient tolerated procedure well.  Lot # [id number] Expiration Date 2025/OCT  Patient given post procedure instructions and IUD care card with expiration date.  Patient is asked to keep IUD strings tucked in her vagina until her postpartum follow up visit in 4-6 weeks. Patient advised to abstain from sexual intercourse and pulling on strings before her follow-up visit. Patient verbalized an understanding of the plan of care and agrees.    Baby Weight: pending  Placenta: 3 vessel, intact. Sent to L&D Complications: None Lacerations: none EBL: 126 mL Analgesia: Epidural   Infant:  APGAR (1 MIN): 9   APGAR (5 MINS): Catherine, DO OB Family Medicine Fellow, [facility] for [employer], [place] [facility] 07/09/2022, 2:15 PM

## 2021-10-11 IMAGING — DX DG RIBS W/ CHEST 3+V*L*
4 series · 4 of 4 positions shown · non-contrast
Comparison: 12/23/2014

CLINICAL DATA: Fall 1 week ago with persistent left-sided chest
pain

EXAM:
LEFT RIBS AND CHEST - 3+ VIEW

[chest pa]
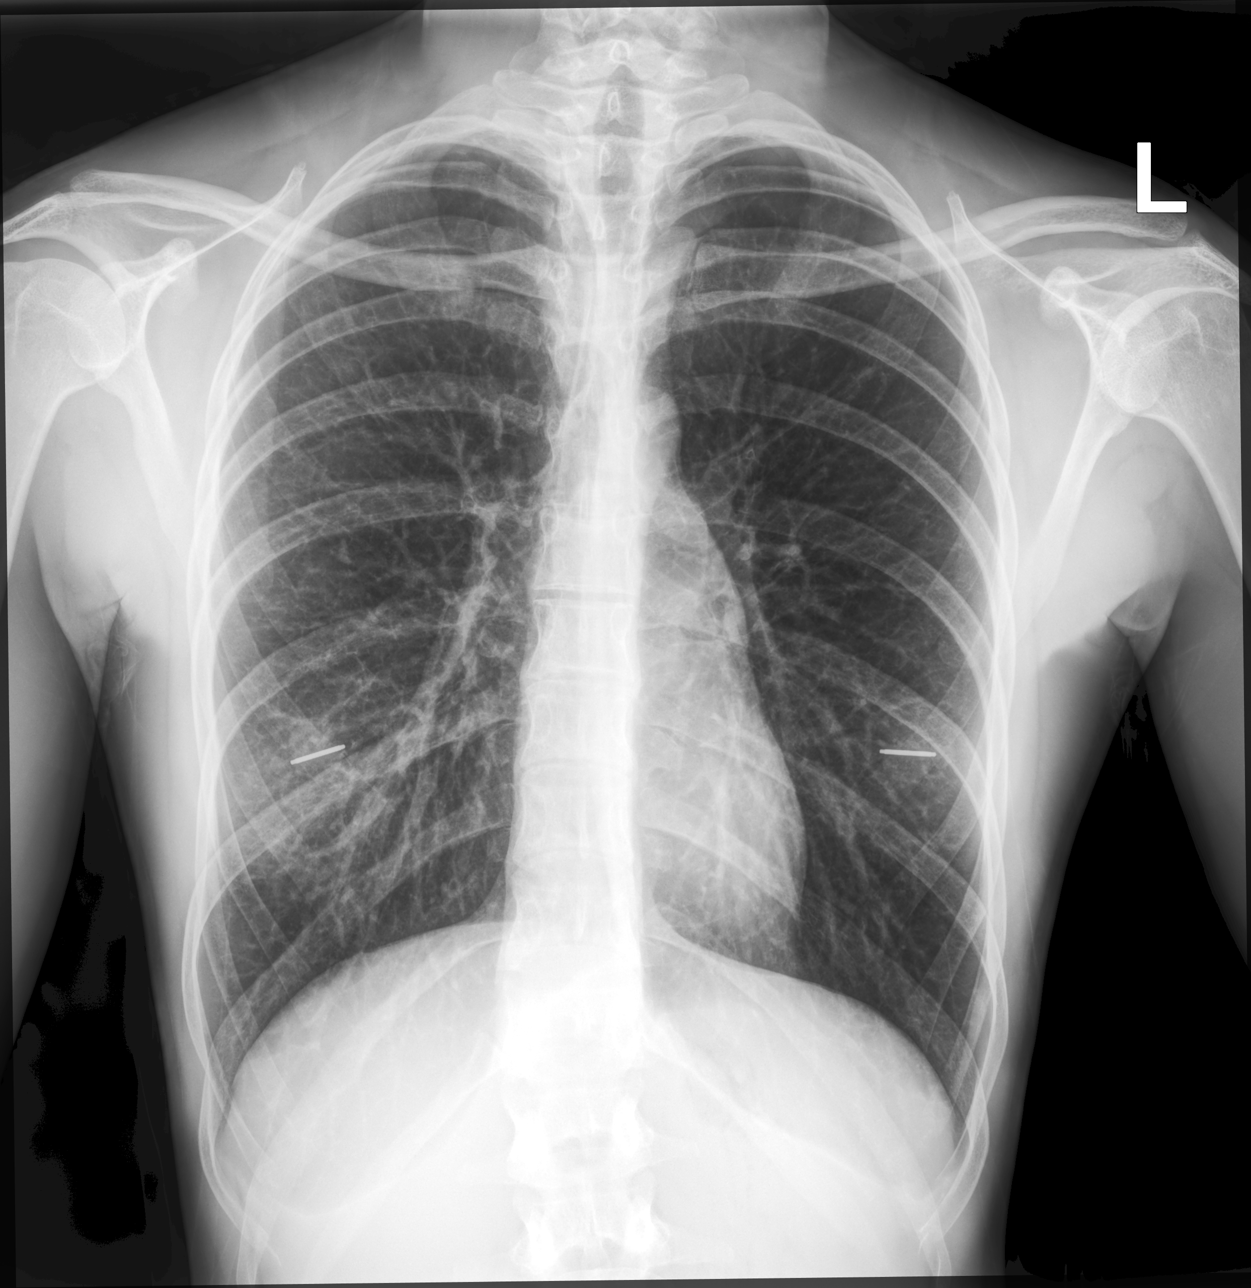

[hemithorax (ribs) ap (1 of 2)]
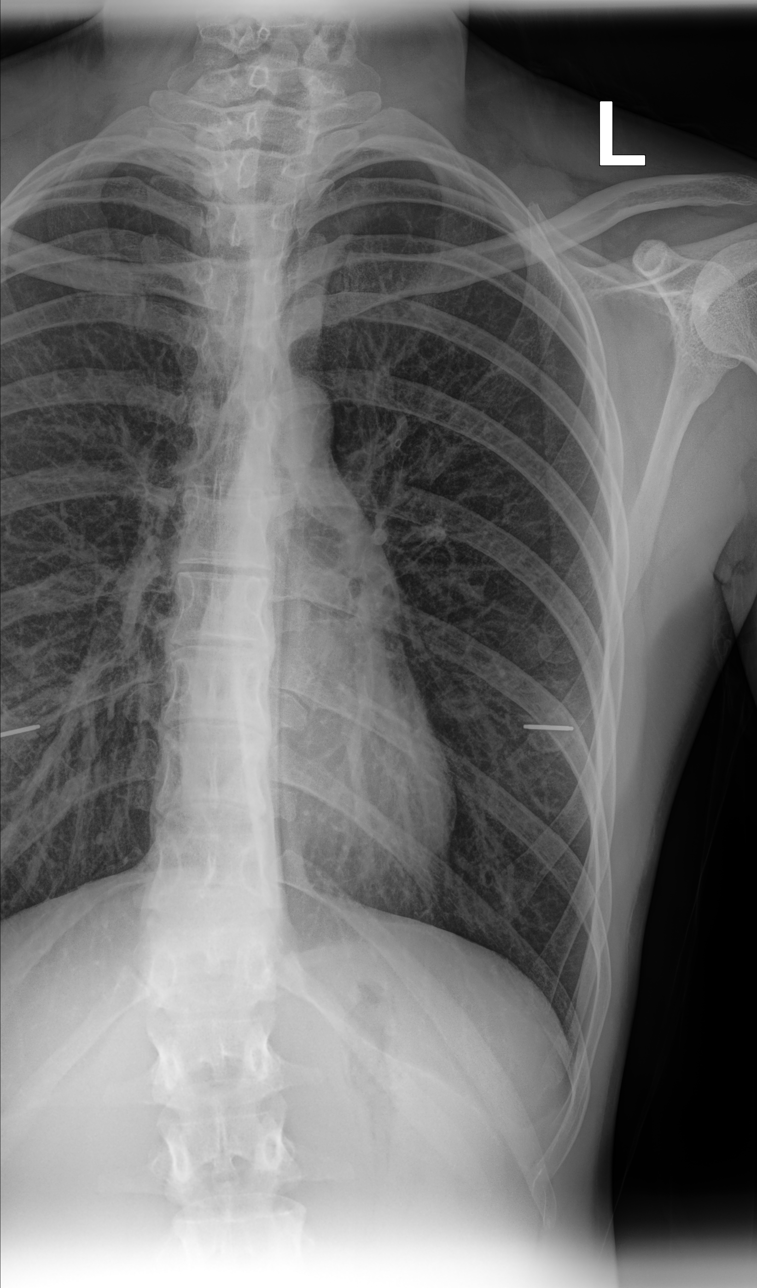

[hemithorax (ribs) mlo]
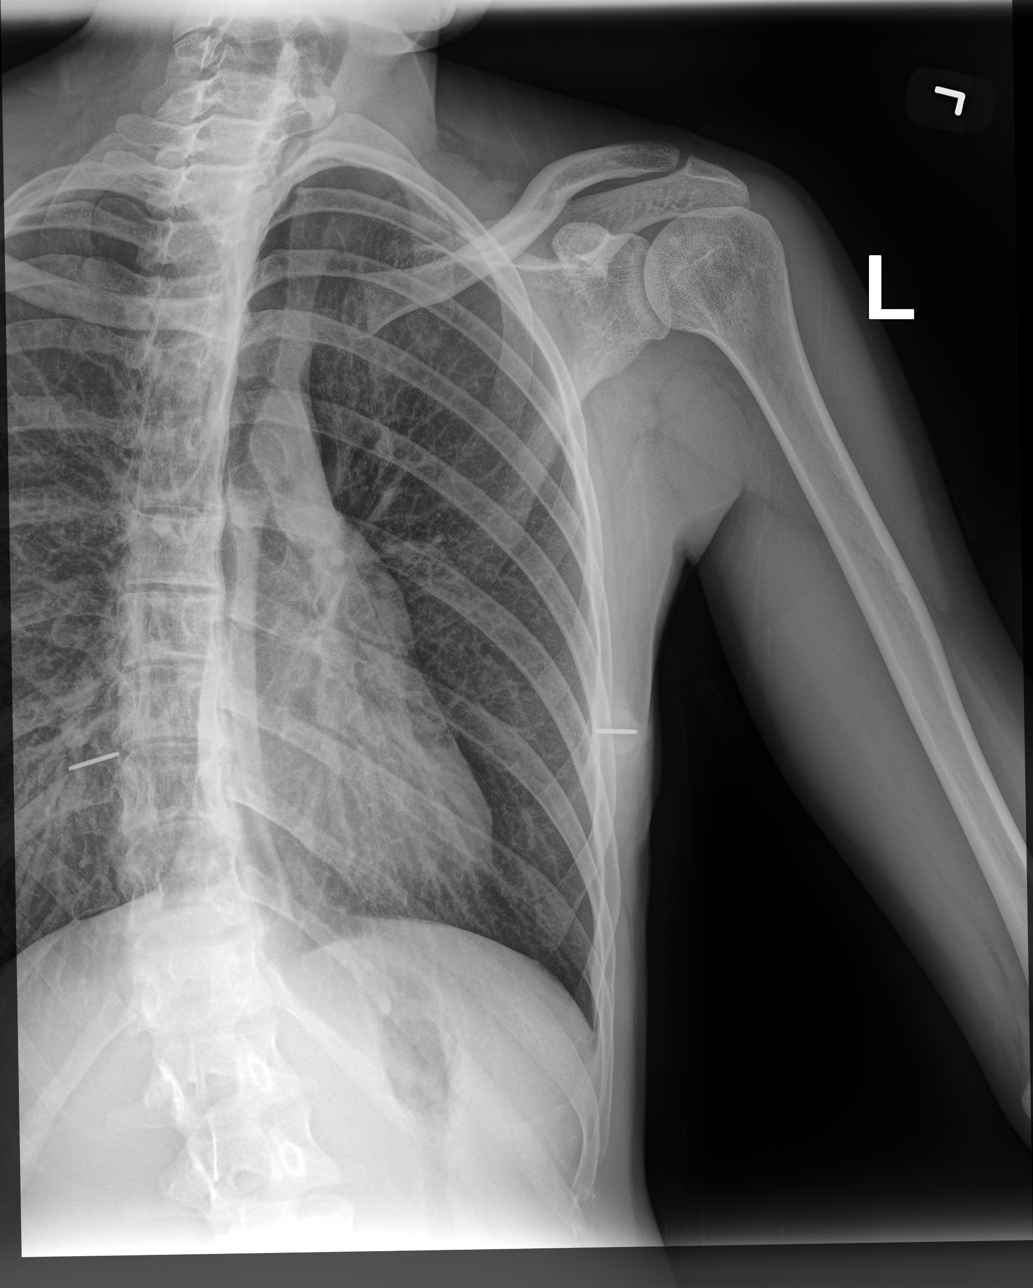

[hemithorax (ribs) ap (2 of 2)]
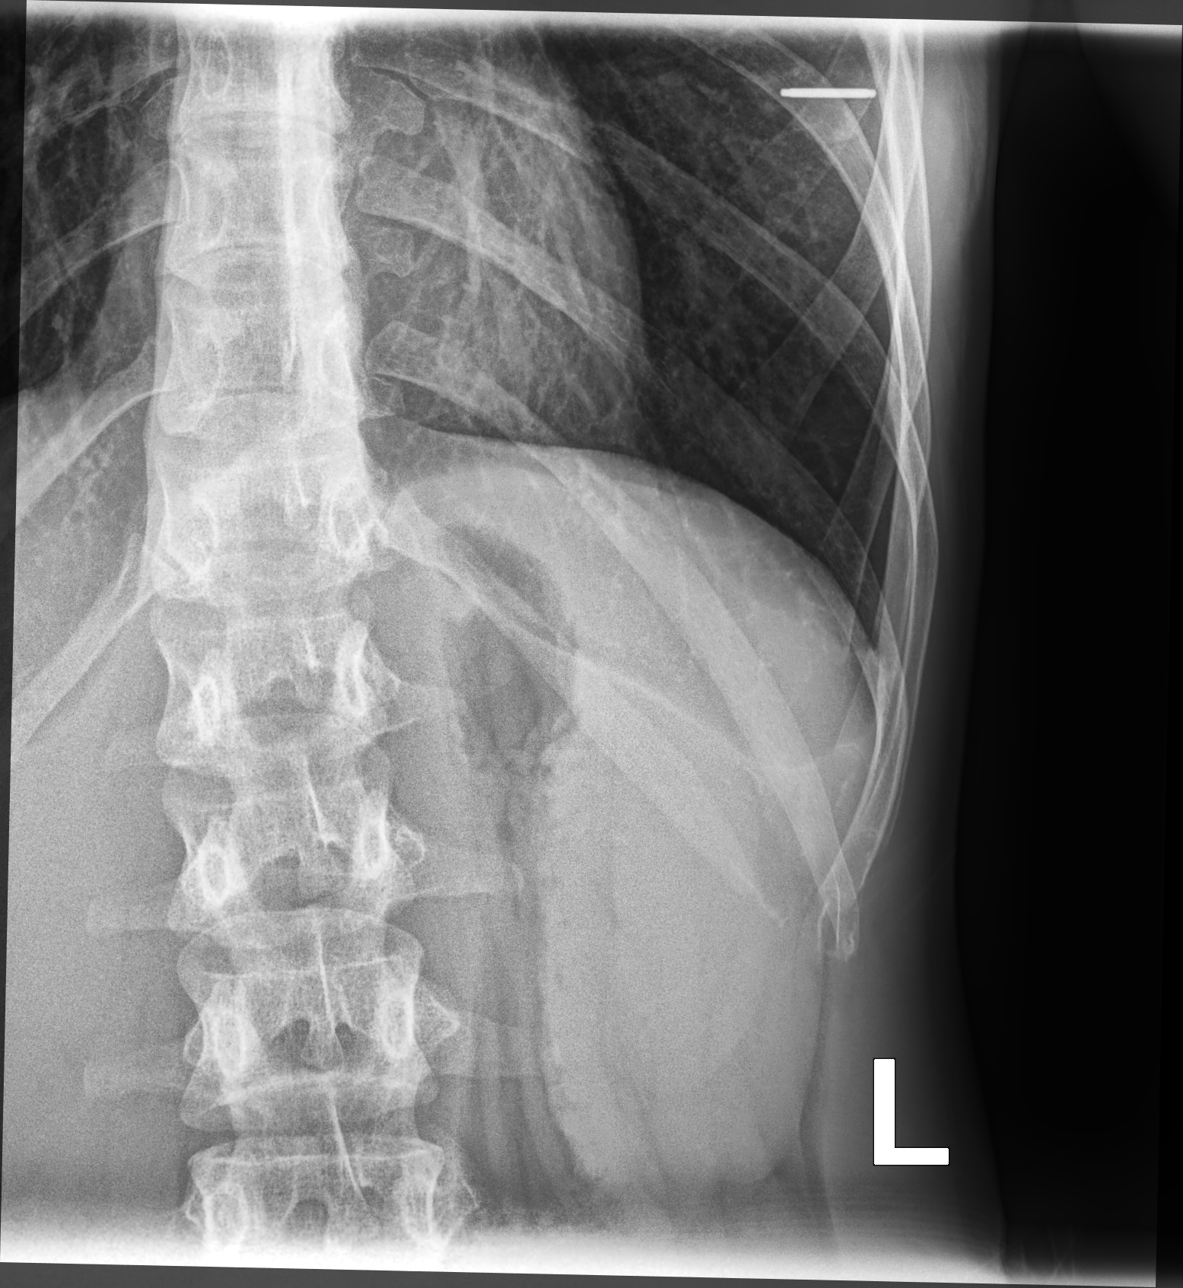

[4 of 4 positions shown; findings below may reference images not displayed]

FINDINGS: Cardiac shadow is within normal limits. The lungs are well aerated
bilaterally. No focal infiltrate or effusion is noted. Bony
structures are within normal limits. No rib fracture is noted.
Nipple piercings are seen.
IMPRESSION: No rib fracture noted.

## 2021-11-18 DIAGNOSIS — B349 Viral infection, unspecified: Secondary | ICD-10-CM | POA: Diagnosis not present

## 2021-11-18 DIAGNOSIS — K529 Noninfective gastroenteritis and colitis, unspecified: Secondary | ICD-10-CM | POA: Diagnosis not present

## 2021-11-24 ENCOUNTER — Ambulatory Visit (INDEPENDENT_AMBULATORY_CARE_PROVIDER_SITE_OTHER): Payer: Medicaid Other | Admitting: *Deleted

## 2021-11-24 ENCOUNTER — Other Ambulatory Visit: Payer: Self-pay

## 2021-11-24 ENCOUNTER — Encounter: Payer: Self-pay | Admitting: *Deleted

## 2021-11-24 VITALS — Ht 65.0 in | Wt 116.4 lb

## 2021-11-24 DIAGNOSIS — Z3201 Encounter for pregnancy test, result positive: Secondary | ICD-10-CM | POA: Diagnosis not present

## 2021-11-24 NOTE — Progress Notes (Signed)
? ?  NURSE VISIT- PREGNANCY CONFIRMATION  ? ?SUBJECTIVE:  ?Katelyn Martinez is a 26 y.o. G50P2002 female at Unknown by uncertain LMP of No LMP recorded (lmp unknown). Patient is pregnant. Here for pregnancy confirmation.  Home pregnancy test: positive x 2   She reports nausea.  She is not taking prenatal vitamins.   ? ?OBJECTIVE:  ?Ht 5\' 5"  (1.651 m)   Wt 116 lb 6.4 oz (52.8 kg)   LMP  (LMP Unknown)   Breastfeeding No   BMI 19.37 kg/m?   ?Appears well, in no apparent distress ? ?No results found for this or any previous visit (from the past 24 hour(s)). ? ?ASSESSMENT: ?Positive pregnancy test, Unknown by LMP   ? ?PLAN: ?Schedule for dating ultrasound: pending quant results ?Prenatal vitamins: plans to begin OTC ASAP   ?Nausea medicines: not currently needed   ?OB packet given: Yes ? ?  ?11/24/2021 ?2:38 PM ? ?

## 2021-11-25 LAB — BETA HCG QUANT (REF LAB): hCG Quant: 2384 m[IU]/mL

## 2021-12-15 ENCOUNTER — Encounter: Payer: Self-pay | Admitting: Women's Health

## 2021-12-16 ENCOUNTER — Other Ambulatory Visit: Payer: Self-pay | Admitting: Women's Health

## 2021-12-16 MED ORDER — DOXYLAMINE-PYRIDOXINE 10-10 MG PO TBEC
DELAYED_RELEASE_TABLET | ORAL | 6 refills | Status: DC
Start: 2021-12-16 — End: 2022-02-16

## 2021-12-17 ENCOUNTER — Other Ambulatory Visit: Payer: Self-pay | Admitting: Obstetrics & Gynecology

## 2021-12-17 DIAGNOSIS — O3680X0 Pregnancy with inconclusive fetal viability, not applicable or unspecified: Secondary | ICD-10-CM

## 2021-12-20 ENCOUNTER — Ambulatory Visit (INDEPENDENT_AMBULATORY_CARE_PROVIDER_SITE_OTHER): Payer: Medicaid Other

## 2021-12-20 DIAGNOSIS — O3680X0 Pregnancy with inconclusive fetal viability, not applicable or unspecified: Secondary | ICD-10-CM | POA: Diagnosis not present

## 2021-12-20 NOTE — Progress Notes (Signed)
[place] 8+4 wks,single IUP with yolk sac,FHR 162 bpm,CRL 19.61 mm,normal ovaries ?

## 2022-01-14 ENCOUNTER — Other Ambulatory Visit: Payer: Self-pay | Admitting: Obstetrics & Gynecology

## 2022-01-14 DIAGNOSIS — Z3682 Encounter for antenatal screening for nuchal translucency: Secondary | ICD-10-CM

## 2022-01-14 DIAGNOSIS — Z3490 Encounter for supervision of normal pregnancy, unspecified, unspecified trimester: Secondary | ICD-10-CM | POA: Insufficient documentation

## 2022-01-14 DIAGNOSIS — O09899 Supervision of other high risk pregnancies, unspecified trimester: Secondary | ICD-10-CM | POA: Insufficient documentation

## 2022-01-17 ENCOUNTER — Ambulatory Visit (INDEPENDENT_AMBULATORY_CARE_PROVIDER_SITE_OTHER): Payer: Medicaid Other

## 2022-01-17 ENCOUNTER — Other Ambulatory Visit (HOSPITAL_COMMUNITY)
Admission: RE | Admit: 2022-01-17 | Discharge: 2022-01-17 | Disposition: A | Discharge status: Home / Self Care | Payer: Medicaid Other | Source: Ambulatory Visit | Attending: Women's Health | Admitting: Women's Health

## 2022-01-17 ENCOUNTER — Encounter: Payer: Self-pay | Admitting: Women's Health

## 2022-01-17 ENCOUNTER — Ambulatory Visit (INDEPENDENT_AMBULATORY_CARE_PROVIDER_SITE_OTHER): Payer: Medicaid Other | Admitting: Women's Health

## 2022-01-17 ENCOUNTER — Ambulatory Visit: Payer: Medicaid Other | Admitting: *Deleted

## 2022-01-17 VITALS — BP 100/60 | HR 73 | Wt 116.0 lb

## 2022-01-17 DIAGNOSIS — O09299 Supervision of pregnancy with other poor reproductive or obstetric history, unspecified trimester: Secondary | ICD-10-CM | POA: Diagnosis not present

## 2022-01-17 DIAGNOSIS — Z3682 Encounter for antenatal screening for nuchal translucency: Secondary | ICD-10-CM

## 2022-01-17 DIAGNOSIS — N898 Other specified noninflammatory disorders of vagina: Secondary | ICD-10-CM | POA: Diagnosis not present

## 2022-01-17 DIAGNOSIS — Z3A12 12 weeks gestation of pregnancy: Secondary | ICD-10-CM

## 2022-01-17 DIAGNOSIS — Z3480 Encounter for supervision of other normal pregnancy, unspecified trimester: Secondary | ICD-10-CM

## 2022-01-17 DIAGNOSIS — O09899 Supervision of other high risk pregnancies, unspecified trimester: Secondary | ICD-10-CM

## 2022-01-17 DIAGNOSIS — Z113 Encounter for screening for infections with a predominantly sexual mode of transmission: Secondary | ICD-10-CM | POA: Insufficient documentation

## 2022-01-17 DIAGNOSIS — Z3490 Encounter for supervision of normal pregnancy, unspecified, unspecified trimester: Secondary | ICD-10-CM | POA: Insufficient documentation

## 2022-01-17 DIAGNOSIS — Z8632 Personal history of gestational diabetes: Secondary | ICD-10-CM | POA: Diagnosis not present

## 2022-01-17 DIAGNOSIS — Z3481 Encounter for supervision of other normal pregnancy, first trimester: Secondary | ICD-10-CM | POA: Diagnosis not present

## 2022-01-17 LAB — POCT URINALYSIS DIPSTICK OB
Blood, UA: NEGATIVE
Glucose, UA: NEGATIVE
Ketones, UA: NEGATIVE
Leukocytes, UA: NEGATIVE
Nitrite, UA: NEGATIVE
POC,PROTEIN,UA: NEGATIVE

## 2022-01-17 NOTE — Progress Notes (Signed)
? ? ?INITIAL OBSTETRICAL VISIT ?Patient name: Katelyn Martinez MRN [id number]  Date of birth: December 17, 1995 ?Chief Complaint:   ?Initial Prenatal Visit ? ?History of Present Illness:   ?Katelyn Martinez is a 26 y.o. G67P2002 Caucasian female at [redacted]w[redacted]d by [place] at 8 weeks with an Estimated Date of Delivery: 07/28/22 being seen today for her initial obstetrical visit.   ?No LMP recorded (lmp unknown). Patient is pregnant. ?Her obstetrical history is significant for  term SVB x 2, GDM 1st pregnancy; short interval pregnancy- last baby born 08/01/21 .   ?Today she reports nausea. Has diclegis, hasn't had to use as much. Vaginal d/c, itching, odor x 1wk.  ?Last pap 08/03/20. Results were: NILM w/ HRHPV not done ? ? ?  01/17/2022  ? 10:43 AM 05/12/2021  ?  9:29 AM 01/27/2021  ?  9:13 AM 08/03/2020  ? 12:03 PM 05/15/2020  ?  9:56 AM  ?Depression screen PHQ 2/9  ?Decreased Interest 0 0 2 1 3   ?Down, Depressed, Hopeless 0 0 2 1 1   ?PHQ - 2 Score 0 0 4 2 4   ?Altered sleeping 1 1 2 1 3   ?Tired, decreased energy 2 1 2 1 3   ?Change in appetite 1 0 2 1 0  ?Feeling bad or failure about yourself  0 0 2 1 0  ?Trouble concentrating 0 0 2 1 0  ?Moving slowly or fidgety/restless 0 0 2 1 0  ?Suicidal thoughts 0 0 2 0 0  ?PHQ-9 Score 4 2 18 8 10   ?Difficult doing work/chores     Not difficult at all  ? ?  ? ?  01/17/2022  ? 10:43 AM 05/12/2021  ?  9:30 AM 01/27/2021  ?  9:14 AM 08/03/2020  ? 12:03 PM  ?GAD 7 : Generalized Anxiety Score  ?Nervous, Anxious, on Edge 2 1 2 1   ?Control/stop worrying 1 1 2 1   ?Worry too much - different things 1 1 2 1   ?Trouble relaxing 1 1 2 1   ?Restless 0 1 2 1   ?Easily annoyed or irritable 0 1 2 1   ?Afraid - awful might happen 0 0 2 1  ?Total GAD 7 Score 5 6 14 7   ? ? ? ?Review of Systems:   ?Pertinent items are noted in HPI ?Denies cramping/contractions, leakage of fluid, vaginal bleeding, abnormal vaginal discharge w/ itching/odor/irritation, headaches, visual changes, shortness of breath, chest pain, abdominal pain,  severe nausea/vomiting, or problems with urination or bowel movements unless otherwise stated above.  ?Pertinent History Reviewed:  ?Reviewed past medical,surgical, social, obstetrical and family history.  ?Reviewed problem list, medications and allergies. ?OB History  ?Gravida Para Term Preterm AB Living  ?3 2 2     2   ?SAB IAB Ectopic Multiple Live Births  ?      0 2  ?  ?# Outcome Date GA Lbr Len/2nd Weight Sex Delivery Anes PTL Lv  ?3 Current           ?2 Term 08/01/21 [redacted]w[redacted]d 16:45 / 00:19 7 lb 3 oz (3.26 kg) M Vag-Spont EPI  LIV  ?1 Term 06/19/17 [redacted]w[redacted]d 04:41 / 00:21 5 lb 12.2 oz (2.614 kg) M Vag-Spont EPI N LIV  ?   Complications: Gestational diabetes  ? ?Physical Assessment:  ? ?Vitals:  ? 01/17/22 1046  ?BP: 100/60  ?Pulse: 73  ?Weight: 116 lb (52.6 kg)  ?Body mass index is 19.3 kg/m?. ? ?     Physical Examination: ? General appearance - well appearing,  and in no distress ? Mental status - alert, oriented to person, place, and time ? Psych:  She has a normal mood and affect ? Skin - warm and dry, normal color, no suspicious lesions noted ? Chest - effort normal, all lung fields clear to auscultation bilaterally ? Heart - normal rate and regular rhythm ? Abdomen - soft, nontender ? Extremities:  No swelling or varicosities noted ? Pelvic - VULVA: normal appearing vulva with no masses, tenderness or lesions  VAGINA: normal appearing vagina with normal color and discharge, no lesions  CERVIX: normal appearing cervix without discharge or lesions, no CMT, CV swab obtained ? Thin prep pap is not done  ? ?Chaperone: Jobe Marker   ? ?TODAY'S NT [place] 12+4 wks,measurements c/w dates,CRL 66.56 mm,NB present,NT 1.5 mm,normal ovaries,FHR 142 bpm,posterior placenta ? ?Results for orders placed or performed in visit on 01/17/22 (from the past 24 hour(s))  ?POC Urinalysis Dipstick OB  ? Collection Time: 01/17/22 11:06 AM  ?Result Value Ref Range  ? Color, UA    ? Clarity, UA    ? Glucose, UA Negative Negative  ? Bilirubin,  UA    ? Ketones, UA neg   ? Spec Grav, UA    ? Blood, UA neg   ? pH, UA    ? POC,PROTEIN,UA Negative Negative, Trace, Small (1+), Moderate (2+), Large (3+), 4+  ? Urobilinogen, UA    ? Nitrite, UA neg   ? Leukocytes, UA Negative Negative  ? Appearance    ? Odor    ?  ?Assessment & Plan:  ?1) Low-Risk Pregnancy G3P2002 at [redacted]w[redacted]d with an Estimated Date of Delivery: 07/28/22  ? ?2) Initial OB visit ? ?3) Short interval pregnancy ? ?4) Vaginal d/c w/ itching/odor> CV swab sent ? ?5) H/O GDM> A1C today ? ?Meds: No orders of the defined types were placed in this encounter. ? ? ?Initial labs obtained ?Continue prenatal vitamins ?Reviewed n/v relief measures and warning s/s to report ?Reviewed recommended weight gain based on pre-gravid BMI ?Encouraged well-balanced diet ?Genetic & carrier screening discussed: requests Panorama and NT/IT ?Ultrasound discussed; fetal survey: requested ?CCNC completed> form faxed if has or is planning to apply for medicaid ?The nature of [place] [facility] for [facility] with multiple MDs and other Advanced Practice Providers was explained to patient; also emphasized that fellows, residents, and students are part of our team. ?Does have home bp cuff. Office bp cuff given: no. Rx sent: no. Check bp weekly, let us know if consistently >140/90.  ? ?Follow-up: Return in about 4 weeks (around 02/14/2022) for LROB, 2nd IT, CNM, in person.  ? ?Orders Placed This Encounter  ?Procedures  ? Urine Culture  ? Integrated 1  ? Genetic Screening  ? Hemoglobin A1c  ? CBC/D/Plt+RPR+Rh+ABO+RubIgG...  ? POC Urinalysis Dipstick OB  ? ? ?Cheral Marker CNM, WHNP-BC ?01/17/2022 ?11:24 AM  ?

## 2022-01-17 NOTE — Patient Instructions (Signed)
Katelyn Martinez, thank you for choosing our office today! We appreciate the opportunity to meet your healthcare needs. You may receive a short survey by mail, e-mail, or through [employer]. If you are happy with your care we would appreciate if you could take just a few minutes to complete the survey questions. We read all of your comments and take your feedback very seriously. Thank you again for choosing our office.  ?Center for [employer] Team at [facility] ? [facility] at [facility] ?([address] [place], [place] [address]) ?Entrance C, located off of [address] ?Free 24/7 valet parking  ? Nausea & Vomiting ?Have saltine crackers or pretzels by your bed and eat a few bites before you raise your head out of bed in the morning ?Eat small frequent meals throughout the day instead of large meals ?Drink plenty of fluids throughout the day to stay hydrated, just don't drink a lot of fluids with your meals.  This can make your stomach fill up faster making you feel sick ?Do not brush your teeth right after you eat ?Products with real ginger are good for nausea, like ginger ale and ginger hard candy Make sure it says made with real ginger! ?Sucking on sour candy like lemon heads is also good for nausea ?If your prenatal vitamins make you nauseated, take them at night so you will sleep through the nausea ?Sea Bands ?If you feel like you need medicine for the nausea & vomiting please let us know ?If you are unable to keep any fluids or food down please let us know ? ? Constipation ?Drink plenty of fluid, preferably water, throughout the day ?Eat foods high in fiber such as fruits, vegetables, and grains ?Exercise, such as walking, is a good way to keep your bowels regular ?Drink warm fluids, especially warm prune juice, or decaf coffee ?Eat a 1/2 cup of real oatmeal (not instant), 1/2 cup applesauce, and 1/2-1 cup warm prune juice every day ?If needed, you may take Colace (docusate sodium) stool  softener once or twice a day to help keep the stool soft.  ?If you still are having problems with constipation, you may take Miralax once daily as needed to help keep your bowels regular.  ? ?Home Blood Pressure Monitoring for Patients  ? ?Your provider has recommended that you check your blood pressure (BP) at least once a week at home. If you do not have a blood pressure cuff at home, one will be provided for you. Contact your provider if you have not received your monitor within 1 week.  ? ?Helpful Tips for Accurate Home Blood Pressure Checks  ?Don't smoke, exercise, or drink caffeine 30 minutes before checking your BP ?Use the restroom before checking your BP (a full bladder can raise your pressure) ?Relax in a comfortable upright chair ?Feet on the ground ?Left arm resting comfortably on a flat surface at the level of your heart ?Legs uncrossed ?Back supported ?Sit quietly and don't talk ?Place the cuff on your bare arm ?Adjust snuggly, so that only two fingertips can fit between your skin and the top of the cuff ?Check 2 readings separated by at least one minute ?Keep a log of your BP readings ?For a visual, please reference this diagram: http://ccnc.care/bpdiagram ? ?Provider Name: [facility]     Phone: [phone] ? ?Zone 1: ALL CLEAR  ?Continue to monitor your symptoms:  ?BP reading is less than 140 (top number) or less than 90 (bottom  number)  ?No right upper stomach pain ?No headaches or seeing spots ?No feeling nauseated or throwing up ?No swelling in face and hands ? ?Zone 2: CAUTION ?Call your doctor's office for any of the following:  ?BP reading is greater than 140 (top number) or greater than 90 (bottom number)  ?Stomach pain under your ribs in the middle or right side ?Headaches or seeing spots ?Feeling nauseated or throwing up ?Swelling in face and hands ? ?Zone 3: EMERGENCY  ?Seek immediate medical care if you have any of the following:  ?BP reading is greater than160 (top number) or  greater than 110 (bottom number) ?Severe headaches not improving with Tylenol ?Serious difficulty catching your breath ?Any worsening symptoms from Zone 2  ? ? First Trimester of Pregnancy ?The first trimester of pregnancy is from week 1 until the end of week 12 (months 1 through 3). A week after a sperm fertilizes an egg, the egg will implant on the wall of the uterus. This embryo will begin to develop into a baby. Genes from you and your partner are forming the baby. The female genes determine whether the baby is a boy or a girl. At 6-8 weeks, the eyes and face are formed, and the heartbeat can be seen on ultrasound. At the end of 12 weeks, all the baby's organs are formed.  ?Now that you are pregnant, you will want to do everything you can to have a healthy baby. Two of the most important things are to get good prenatal care and to follow your health care provider's instructions. Prenatal care is all the medical care you receive before the baby's birth. This care will help prevent, find, and treat any problems during the pregnancy and childbirth. ?BODY CHANGES ?Your body goes through many changes during pregnancy. The changes vary from woman to woman.  ?You may gain or lose a couple of pounds at first. ?You may feel sick to your stomach (nauseous) and throw up (vomit). If the vomiting is uncontrollable, call your health care provider. ?You may tire easily. ?You may develop headaches that can be relieved by medicines approved by your health care provider. ?You may urinate more often. Painful urination may mean you have a bladder infection. ?You may develop heartburn as a result of your pregnancy. ?You may develop constipation because certain hormones are causing the muscles that push waste through your intestines to slow down. ?You may develop hemorrhoids or swollen, bulging veins (varicose veins). ?Your breasts may begin to grow larger and become tender. Your nipples may stick out more, and the tissue that  surrounds them (areola) may become darker. ?Your gums may bleed and may be sensitive to brushing and flossing. ?Dark spots or blotches (chloasma, mask of pregnancy) may develop on your face. This will likely fade after the baby is born. ?Your menstrual periods will stop. ?You may have a loss of appetite. ?You may develop cravings for certain kinds of food. ?You may have changes in your emotions from day to day, such as being excited to be pregnant or being concerned that something may go wrong with the pregnancy and baby. ?You may have more vivid and strange dreams. ?You may have changes in your hair. These can include thickening of your hair, rapid growth, and changes in texture. Some women also have hair loss during or after pregnancy, or hair that feels dry or thin. Your hair will most likely return to normal after your baby is born. ?WHAT TO EXPECT AT YOUR PRENATAL  VISITS ?During a routine prenatal visit: ?You will be weighed to make sure you and the baby are growing normally. ?Your blood pressure will be taken. ?Your abdomen will be measured to track your baby's growth. ?The fetal heartbeat will be listened to starting around week 10 or 12 of your pregnancy. ?Test results from any previous visits will be discussed. ?Your health care provider may ask you: ?How you are feeling. ?If you are feeling the baby move. ?If you have had any abnormal symptoms, such as leaking fluid, bleeding, severe headaches, or abdominal cramping. ?If you have any questions. ?Other tests that may be performed during your first trimester include: ?Blood tests to find your blood type and to check for the presence of any previous infections. They will also be used to check for low iron levels (anemia) and Rh antibodies. Later in the pregnancy, blood tests for diabetes will be done along with other tests if problems develop. ?Urine tests to check for infections, diabetes, or protein in the urine. ?An ultrasound to confirm the proper growth  and development of the baby. ?An amniocentesis to check for possible genetic problems. ?Fetal screens for spina bifida and Down syndrome. ?You may need other tests to make sure you and the baby are doing well. ?HOME C

## 2022-01-17 NOTE — Progress Notes (Signed)
[place] 12+4 wks,measurements c/w dates,CRL 66.56 mm,NB present,NT 1.5 mm,normal ovaries,FHR 142 bpm,posterior placenta, ?

## 2022-01-18 LAB — CERVICOVAGINAL ANCILLARY ONLY
Bacterial Vaginitis (gardnerella): NEGATIVE
Candida Glabrata: NEGATIVE
Candida Vaginitis: POSITIVE — AB
Chlamydia: NEGATIVE
Comment: NEGATIVE
Comment: NEGATIVE
Comment: NEGATIVE
Comment: NEGATIVE
Comment: NEGATIVE
Comment: NORMAL
Neisseria Gonorrhea: NEGATIVE
Trichomonas: NEGATIVE

## 2022-01-19 LAB — CBC/D/PLT+RPR+RH+ABO+RUBIGG...
Antibody Screen: NEGATIVE
Basophils Absolute: 0 10*3/uL (ref 0.0–0.2)
Basos: 0 %
EOS (ABSOLUTE): 0.1 10*3/uL (ref 0.0–0.4)
Eos: 2 %
HCV Ab: NONREACTIVE
HIV Screen 4th Generation wRfx: NONREACTIVE
Hematocrit: 39 % (ref 34.0–46.6)
Hemoglobin: 12.8 g/dL (ref 11.1–15.9)
Hepatitis B Surface Ag: NEGATIVE
Immature Grans (Abs): 0 10*3/uL (ref 0.0–0.1)
Immature Granulocytes: 0 %
Lymphocytes Absolute: 1.4 10*3/uL (ref 0.7–3.1)
Lymphs: 19 %
MCH: 27.2 pg (ref 26.6–33.0)
MCHC: 32.8 g/dL (ref 31.5–35.7)
MCV: 83 fL (ref 79–97)
Monocytes Absolute: 0.5 10*3/uL (ref 0.1–0.9)
Monocytes: 6 %
Neutrophils Absolute: 5.1 10*3/uL (ref 1.4–7.0)
Neutrophils: 73 %
Platelets: 241 10*3/uL (ref 150–450)
RBC: 4.71 x10E6/uL (ref 3.77–5.28)
RDW: 13.9 % (ref 11.7–15.4)
RPR Ser Ql: NONREACTIVE
Rh Factor: POSITIVE
Rubella Antibodies, IGG: 1.91 index (ref >0.99)
WBC: 7.2 10*3/uL (ref 3.4–10.8)

## 2022-01-19 LAB — HEMOGLOBIN A1C
Est. average glucose Bld gHb Est-mCnc: 103 mg/dL
Hgb A1c MFr Bld: 5.2 % (ref 4.8–5.6)

## 2022-01-19 LAB — INTEGRATED 1
Crown Rump Length: 66.6 mm
Gest. Age on Collection Date: 12.7 weeks
Maternal Age at EDD: 25.4 yr
Nuchal Translucency (NT): 1.5 mm
Number of Fetuses: 1
PAPP-A Value: 354.4 ng/mL
Weight: 116 [lb_av]

## 2022-01-19 LAB — URINE CULTURE

## 2022-01-19 LAB — HCV INTERPRETATION

## 2022-02-16 ENCOUNTER — Encounter: Payer: Self-pay | Admitting: Advanced Practice Midwife

## 2022-02-16 ENCOUNTER — Ambulatory Visit (INDEPENDENT_AMBULATORY_CARE_PROVIDER_SITE_OTHER): Payer: Medicaid Other | Admitting: Advanced Practice Midwife

## 2022-02-16 VITALS — BP 112/70 | HR 78 | Wt 120.0 lb

## 2022-02-16 DIAGNOSIS — Z3480 Encounter for supervision of other normal pregnancy, unspecified trimester: Secondary | ICD-10-CM

## 2022-02-16 DIAGNOSIS — Z1379 Encounter for other screening for genetic and chromosomal anomalies: Secondary | ICD-10-CM | POA: Diagnosis not present

## 2022-02-16 DIAGNOSIS — Z3A16 16 weeks gestation of pregnancy: Secondary | ICD-10-CM

## 2022-02-16 DIAGNOSIS — Z363 Encounter for antenatal screening for malformations: Secondary | ICD-10-CM

## 2022-02-16 NOTE — Patient Instructions (Signed)
Teddy, thank you for choosing our office today! We appreciate the opportunity to meet your healthcare needs. You may receive a short survey by mail, e-mail, or through MyChart. If you are happy with your care we would appreciate if you could take just a few minutes to complete the survey questions. We read all of your comments and take your feedback very seriously. Thank you again for choosing our office.  [facility] Team at [facility] ([address] [place], [place] [address]) Entrance C, located off of [address] Free 24/7 valet parking  Go to [email or web address] to register for FREE online childbirth classes  Call the office ([phone]) or go to [facility] if: You begin to severe cramping Your water breaks.  Sometimes it is a big gush of fluid, sometimes it is just a trickle that keeps getting your panties wet or running down your legs You have vaginal bleeding.  It is normal to have a small amount of spotting if your cervix was checked.   [place] Pediatricians/Family Doctors [facility] ([facility]): [address], [phone]           [facility]: [address], [phone]                [facility] ([facility]): [address], [phone] (call to ask if accepting patients) [facility]: [address], [place], [phone]    [place] Pediatricians/Family Doctors [facility] ([facility]): [address], [phone] [facility]: [address], [phone] [facility]: [address], [phone]  [place] [facility]  [facility] ([facility]): [phone] [facility]: [address], [phone]   [place] [facility]: [address], [phone]  [place] [facility]  [facility]: [address], [phone]  Home Blood Pressure Monitoring for Patients   Your provider has recommended that you check your blood pressure (BP) at least once a week at home. If you do not have a blood pressure cuff at home, one will be provided for you. Contact your provider if you have not received your monitor within 1 week.   Helpful Tips for Accurate Home Blood Pressure Checks  Don't smoke, exercise, or drink caffeine 30 minutes before checking your BP Use the restroom before checking your BP (a full bladder can raise your pressure) Relax in a comfortable upright chair Feet on the ground Left arm resting comfortably on a flat surface at the level of your heart Legs uncrossed Back supported Sit quietly and don't talk Place the cuff on your bare arm Adjust snuggly, so that only two fingertips can fit between your skin and the top of the cuff Check 2 readings separated by at least one minute Keep a log of your BP readings For a visual, please reference this diagram: http://[facility].care/bpdiagram  Provider Name: [facility]     Phone: [phone]  Zone 1: ALL CLEAR  Continue to monitor your symptoms:  BP reading is less than 140 (top number) or less than 90 (bottom number)  No right upper stomach pain No headaches or seeing spots No feeling nauseated or throwing up No swelling in face and hands  Zone 2: CAUTION Call your doctor's office for any of the following:  BP reading is greater than 140 (top number) or greater than   90 (bottom number)  Stomach pain under your ribs in the middle or right side Headaches or seeing spots Feeling nauseated or throwing up Swelling in face and hands  Zone 3: EMERGENCY  Seek immediate medical care if you have any of the following:  BP reading is greater than160 (top number) or greater than 110 (bottom number) Severe headaches not improving with Tylenol Serious difficulty catching your breath Any worsening symptoms from  Zone 2     Second Trimester of Pregnancy The second trimester is from week 14 through week 27 (months 4 through 6). The second trimester is often a time when you feel your best. Your body has adjusted to being pregnant, and you begin to feel better physically. Usually, morning sickness has lessened or quit completely, you may have more energy, and you may have an increase in appetite. The second trimester is also a time when the fetus is growing rapidly. At the end of the sixth month, the fetus is about 9 inches long and weighs about 1 pounds. You will likely begin to feel the baby move (quickening) between 16 and 20 weeks of pregnancy. Body changes during your second trimester Your body continues to go through many changes during your second trimester. The changes vary from woman to woman. Your weight will continue to increase. You will notice your lower abdomen bulging out. You may begin to get stretch marks on your hips, abdomen, and breasts. You may develop headaches that can be relieved by medicines. The medicines should be approved by your health care provider. You may urinate more often because the fetus is pressing on your bladder. You may develop or continue to have heartburn as a result of your pregnancy. You may develop constipation because certain hormones are causing the muscles that push waste through your intestines to slow down. You may develop hemorrhoids or swollen, bulging veins (varicose veins). You may have back pain. This is caused by: Weight gain. Pregnancy hormones that are relaxing the joints in your pelvis. A shift in weight and the muscles that support your balance. Your breasts will continue to grow and they will continue to become tender. Your gums may bleed and may be sensitive to brushing and flossing. Dark spots or blotches (chloasma, mask of pregnancy) may develop on your face. This will likely fade after the baby is born. A dark line from your belly button to  the pubic area (linea nigra) may appear. This will likely fade after the baby is born. You may have changes in your hair. These can include thickening of your hair, rapid growth, and changes in texture. Some women also have hair loss during or after pregnancy, or hair that feels dry or thin. Your hair will most likely return to normal after your baby is born.  What to expect at prenatal visits During a routine prenatal visit: You will be weighed to make sure you and the fetus are growing normally. Your blood pressure will be taken. Your abdomen will be measured to track your baby's growth. The fetal heartbeat will be listened to. Any test results from the previous visit will be discussed.  Your health care provider may ask you: How you are feeling. If you are feeling the baby move. If you have had any abnormal symptoms, such as leaking fluid, bleeding, severe headaches, or abdominal cramping. If you are using any tobacco products, including cigarettes, chewing tobacco, and electronic cigarettes. If you have any questions.  Other tests that may be performed during   your second trimester include: Blood tests that check for: Low iron levels (anemia). High blood sugar that affects pregnant women (gestational diabetes) between 24 and 28 weeks. Rh antibodies. This is to check for a protein on red blood cells (Rh factor). Urine tests to check for infections, diabetes, or protein in the urine. An ultrasound to confirm the proper growth and development of the baby. An amniocentesis to check for possible genetic problems. Fetal screens for spina bifida and Down syndrome. HIV (human immunodeficiency virus) testing. Routine prenatal testing includes screening for HIV, unless you choose not to have this test.  Follow these instructions at home: Medicines Follow your health care provider's instructions regarding medicine use. Specific medicines may be either safe or unsafe to take during  pregnancy. Take a prenatal vitamin that contains at least 600 micrograms (mcg) of folic acid. If you develop constipation, try taking a stool softener if your health care provider approves. Eating and drinking Eat a balanced diet that includes fresh fruits and vegetables, whole grains, good sources of protein such as meat, eggs, or tofu, and low-fat dairy. Your health care provider will help you determine the amount of weight gain that is right for you. Avoid raw meat and uncooked cheese. These carry germs that can cause birth defects in the baby. If you have low calcium intake from food, talk to your health care provider about whether you should take a daily calcium supplement. Limit foods that are high in fat and processed sugars, such as fried and sweet foods. To prevent constipation: Drink enough fluid to keep your urine clear or pale yellow. Eat foods that are high in fiber, such as fresh fruits and vegetables, whole grains, and beans. Activity Exercise only as directed by your health care provider. Most women can continue their usual exercise routine during pregnancy. Try to exercise for 30 minutes at least 5 days a week. Stop exercising if you experience uterine contractions. Avoid heavy lifting, wear low heel shoes, and practice good posture. A sexual relationship may be continued unless your health care provider directs you otherwise. Relieving pain and discomfort Wear a good support bra to prevent discomfort from breast tenderness. Take warm sitz baths to soothe any pain or discomfort caused by hemorrhoids. Use hemorrhoid cream if your health care provider approves. Rest with your legs elevated if you have leg cramps or low back pain. If you develop varicose veins, wear support hose. Elevate your feet for 15 minutes, 3-4 times a day. Limit salt in your diet. Prenatal Care Write down your questions. Take them to your prenatal visits. Keep all your prenatal visits as told by your health  care provider. This is important. Safety Wear your seat belt at all times when driving. Make a list of emergency phone numbers, including numbers for family, friends, the hospital, and police and fire departments. General instructions Ask your health care provider for a referral to a local prenatal education class. Begin classes no later than the beginning of month 6 of your pregnancy. Ask for help if you have counseling or nutritional needs during pregnancy. Your health care provider can offer advice or refer you to specialists for help with various needs. Do not use hot tubs, steam rooms, or saunas. Do not douche or use tampons or scented sanitary pads. Do not cross your legs for long periods of time. Avoid cat litter boxes and soil used by cats. These carry germs that can cause birth defects in the baby and possibly loss of the   fetus by miscarriage or stillbirth. Avoid all smoking, herbs, alcohol, and unprescribed drugs. Chemicals in these products can affect the formation and growth of the baby. Do not use any products that contain nicotine or tobacco, such as cigarettes and e-cigarettes. If you need help quitting, ask your health care provider. Visit your dentist if you have not gone yet during your pregnancy. Use a soft toothbrush to brush your teeth and be gentle when you floss. Contact a health care provider if: You have dizziness. You have mild pelvic cramps, pelvic pressure, or nagging pain in the abdominal area. You have persistent nausea, vomiting, or diarrhea. You have a bad smelling vaginal discharge. You have pain when you urinate. Get help right away if: You have a fever. You are leaking fluid from your vagina. You have spotting or bleeding from your vagina. You have severe abdominal cramping or pain. You have rapid weight gain or weight loss. You have shortness of breath with chest pain. You notice sudden or extreme swelling of your face, hands, ankles, feet, or legs. You  have not felt your baby move in over an hour. You have severe headaches that do not go away when you take medicine. You have vision changes. Summary The second trimester is from week 14 through week 27 (months 4 through 6). It is also a time when the fetus is growing rapidly. Your body goes through many changes during pregnancy. The changes vary from woman to woman. Avoid all smoking, herbs, alcohol, and unprescribed drugs. These chemicals affect the formation and growth your baby. Do not use any tobacco products, such as cigarettes, chewing tobacco, and e-cigarettes. If you need help quitting, ask your health care provider. Contact your health care provider if you have any questions. Keep all prenatal visits as told by your health care provider. This is important. This information is not intended to replace advice given to you by your health care provider. Make sure you discuss any questions you have with your health care provider. Document Released: 08/30/2001 Document Revised: 02/11/2016 Document Reviewed: 11/06/2012 Elsevier Interactive Patient Education  2017 [employer]  

## 2022-02-16 NOTE — Progress Notes (Signed)
   LOW-RISK PREGNANCY VISIT Patient name: Katelyn Martinez MRN [id number]  Date of birth: 10/13/1995 Chief Complaint:   Routine Prenatal Visit (2nd IT today; pelvic pain after standing for a long time)  History of Present Illness:   Katelyn Martinez is a 26 y.o. G22P2002 female at [redacted]w[redacted]d with an Estimated Date of Delivery: 07/28/22 being seen today for ongoing management of a low-risk pregnancy.  Today she reports no complaints. Contractions: Not present. Vag. Bleeding: None.  Movement: Present. denies leaking of fluid. Review of Systems:   Pertinent items are noted in HPI Denies abnormal vaginal discharge w/ itching/odor/irritation, headaches, visual changes, shortness of breath, chest pain, abdominal pain, severe nausea/vomiting, or problems with urination or bowel movements unless otherwise stated above. Pertinent History Reviewed:  Reviewed past medical,surgical, social, obstetrical and family history.  Reviewed problem list, medications and allergies. Physical Assessment:   Vitals:   02/16/22 1115  BP: 112/70  Pulse: 78  Weight: 120 lb (54.4 kg)  Body mass index is 19.97 kg/m.        Physical Examination:   General appearance: Well appearing, and in no distress  Mental status: Alert, oriented to person, place, and time  Skin: Warm & dry  Cardiovascular: Normal heart rate noted  Respiratory: Normal respiratory effort, no distress  Abdomen: Soft, gravid, nontender  Pelvic: Cervical exam deferred         Extremities: Edema: None  Fetal Status: Fetal Heart Rate (bpm): 145   Movement: Present    No results found for this or any previous visit (from the past 24 hour(s)).  Assessment & Plan:  1) Low-risk pregnancy G3P2002 at [redacted]w[redacted]d with an Estimated Date of Delivery: 07/28/22   2) Hx GDM, nl early HgbA1c   Meds: No orders of the defined types were placed in this encounter.  Labs/procedures today: 2nd IT  Plan:  Continue routine obstetrical care   Reviewed: Preterm labor  symptoms and general obstetric precautions including but not limited to vaginal bleeding, contractions, leaking of fluid and fetal movement were reviewed in detail with the patient.  All questions were answered. Has home bp cuff. Check bp weekly, let us know if >140/90.   Follow-up: Return in about 3 weeks (around 03/09/2022) for LROB, [place]: Anatomy, in person.  Orders Placed This Encounter  Procedures   US OB Comp + 14 Wk   INTEGRATED 2   Arabella Merles [facility] 02/16/2022 11:32 AM

## 2022-02-18 LAB — INTEGRATED 2
AFP MoM: 0.78
Alpha-Fetoprotein: 33.1 ng/mL
Crown Rump Length: 66.6 mm
DIA MoM: 1.02
DIA Value: 177.2 pg/mL
Estriol, Unconjugated: 1.71 ng/mL
Gest. Age on Collection Date: 12.7 weeks
Gestational Age: 17 weeks
Maternal Age at EDD: 25.4 yr
Nuchal Translucency (NT): 1.5 mm
Nuchal Translucency MoM: 1
Number of Fetuses: 1
PAPP-A MoM: 0.24
PAPP-A Value: 354.4 ng/mL
Test Results:: NEGATIVE
Weight: 116 [lb_av]
Weight: 120 [lb_av]
hCG MoM: 0.75
hCG Value: 25.6 IU/mL
uE3 MoM: 1.43

## 2022-03-23 ENCOUNTER — Ambulatory Visit (INDEPENDENT_AMBULATORY_CARE_PROVIDER_SITE_OTHER): Payer: Medicaid Other

## 2022-03-23 ENCOUNTER — Ambulatory Visit (INDEPENDENT_AMBULATORY_CARE_PROVIDER_SITE_OTHER): Payer: Medicaid Other | Admitting: Advanced Practice Midwife

## 2022-03-23 VITALS — BP 107/66 | HR 78 | Wt 124.0 lb

## 2022-03-23 DIAGNOSIS — Z3A21 21 weeks gestation of pregnancy: Secondary | ICD-10-CM

## 2022-03-23 DIAGNOSIS — Z3480 Encounter for supervision of other normal pregnancy, unspecified trimester: Secondary | ICD-10-CM

## 2022-03-23 DIAGNOSIS — Z363 Encounter for antenatal screening for malformations: Secondary | ICD-10-CM

## 2022-03-23 NOTE — Patient Instructions (Signed)
Katelyn Martinez, I greatly value your feedback.  If you receive a survey following your visit with [place] today, we appreciate you taking the time to fill it out.  Thanks, Philipp Deputy, CNM   You will have your sugar test next visit.  Please do not eat or drink anything after midnight the night before you come, not even water.  You will be here for at least two hours.  Please make an appointment online for the bloodwork at [email or web address] for 8:30am (or as close to this as possible). Make sure you select the [facility] service center. The day of the appointment, check in with our office first, then you will go to [facility] to start the sugar test.    [facility] HAS MOVED!!! It is now [facility] at [facility] ([address] [place], [place] [address]) Entrance C, located off of [address] valet parking  Go to [email or web address] to register for FREE online childbirth classes   Call the office [phone]) or go to [facility] if: You begin to have strong, frequent contractions Your water breaks.  Sometimes it is a big gush of fluid, sometimes it is just a trickle that keeps getting your panties wet or running down your legs You have vaginal bleeding.  It is normal to have a small amount of spotting if your cervix was checked.  You don't feel your baby moving like normal.  If you don't, get you something to eat and drink and lay down and focus on feeling your baby move.   If your baby is still not moving like normal, you should call the office or go to [facility].  [place] Pediatricians/Family Doctors: [facility] [phone]           [facility] [phone]                [facility] [phone] (usually not accepting new patients unless you have family there already, you are always welcome to call and ask)      [facility] Department [phone]       [facility] Pediatricians/Family Doctors:  [facility]: [phone] [facility]: [phone] [facility]: [phone]  [facility] Doctors:  [facility]: [phone]  [facility]: [phone]  [facility] Doctors: [facility]: [phone]   Home Blood Pressure Monitoring for Patients   Your provider has recommended that you check your blood pressure (BP) at least once a week at home. If you do not have a blood pressure cuff at home, one will be provided for you. Contact your provider if you have not received your monitor within 1 week.   Helpful Tips for Accurate Home Blood Pressure Checks  Don't smoke, exercise, or drink caffeine 30 minutes before checking your BP Use the restroom before checking your BP (a full bladder can raise your pressure) Relax in a comfortable upright chair Feet on the ground Left arm resting comfortably on a flat surface at the level of your heart Legs uncrossed Back supported Sit quietly and don't talk Place the cuff on your bare arm Adjust snuggly, so that only two fingertips can fit between your skin and the top of the cuff Check 2 readings separated by at least one minute Keep a log of your BP readings For a visual, please reference this diagram: http://ccnc.care/bpdiagram  Provider Name: [facility]     Phone: [phone]  Zone 1: ALL CLEAR  Continue to monitor your symptoms:  BP reading is less than 140 (top number) or less than 90 (bottom number)  No right upper stomach pain No headaches or seeing spots No feeling nauseated or throwing up No swelling in face and hands  Zone 2: CAUTION Call your doctor's office for any of the following:  BP reading is greater than 140 (top number) or greater than 90 (bottom number)  Stomach pain under your ribs in the middle or right side Headaches or seeing spots Feeling nauseated or throwing up Swelling in face and hands  Zone 3: EMERGENCY   Seek immediate medical care if you have any of the following:  BP reading is greater than160 (top number) or greater than 110 (bottom number) Severe headaches not improving with Tylenol Serious difficulty catching your breath Any worsening symptoms from Zone 2   Second Trimester of Pregnancy The second trimester is from week 13 through week 28, months 4 through 6. The second trimester is often a time when you feel your best. Your body has also adjusted to being pregnant, and you begin to feel better physically. Usually, morning sickness has lessened or quit completely, you may have more energy, and you may have an increase in appetite. The second trimester is also a time when the fetus is growing rapidly. At the end of the sixth month, the fetus is about 9 inches long and weighs about 1 pounds. You will likely begin to feel the baby move (quickening) between 18 and 20 weeks of the pregnancy. BODY CHANGES Your body goes through many changes during pregnancy. The changes vary from woman to woman.  Your weight will continue to increase. You will notice your lower abdomen bulging out. You may begin to get stretch marks on your hips, abdomen, and breasts. You may develop headaches that can be relieved by medicines approved by your health care provider. You may urinate more often because the fetus is pressing on your bladder. You may develop or continue to have heartburn as a result of your pregnancy. You may develop constipation because certain hormones are causing the muscles that push waste through your intestines to slow down. You may develop hemorrhoids or swollen, bulging veins (varicose veins). You may have back pain because of the weight gain and pregnancy hormones relaxing your joints between the bones in your pelvis and as a result of a shift in weight and the muscles that support your balance. Your breasts will continue to grow and be tender. Your gums may bleed and may be sensitive to  brushing and flossing. Dark spots or blotches (chloasma, mask of pregnancy) may develop on your face. This will likely fade after the baby is born. A dark line from your belly button to the pubic area (linea nigra) may appear. This will likely fade after the baby is born. You may have changes in your hair. These can include thickening of your hair, rapid growth, and changes in texture. Some women also have hair loss during or after pregnancy, or hair that feels dry or thin. Your hair will most likely return to normal after your baby is born. WHAT TO EXPECT AT YOUR PRENATAL VISITS During a routine prenatal visit: You will be weighed to make sure you and the fetus are growing normally. Your blood pressure will be taken. Your abdomen will be measured to track your baby's growth. The fetal heartbeat will be listened to. Any test results from the previous visit will be discussed. Your health care provider  may ask you: How you are feeling. If you are feeling the baby move. If you have had any abnormal symptoms, such as leaking fluid, bleeding, severe headaches, or abdominal cramping. If you have any questions. Other tests that may be performed during your second trimester include: Blood tests that check for: Low iron levels (anemia). Gestational diabetes (between 24 and 28 weeks). Rh antibodies. Urine tests to check for infections, diabetes, or protein in the urine. An ultrasound to confirm the proper growth and development of the baby. An amniocentesis to check for possible genetic problems. Fetal screens for spina bifida and Down syndrome. HOME CARE INSTRUCTIONS  Avoid all smoking, herbs, alcohol, and unprescribed drugs. These chemicals affect the formation and growth of the baby. Follow your health care provider's instructions regarding medicine use. There are medicines that are either safe or unsafe to take during pregnancy. Exercise only as directed by your health care provider.  Experiencing uterine cramps is a good sign to stop exercising. Continue to eat regular, healthy meals. Wear a good support bra for breast tenderness. Do not use hot tubs, steam rooms, or saunas. Wear your seat belt at all times when driving. Avoid raw meat, uncooked cheese, cat litter boxes, and soil used by cats. These carry germs that can cause birth defects in the baby. Take your prenatal vitamins. Try taking a stool softener (if your health care provider approves) if you develop constipation. Eat more high-fiber foods, such as fresh vegetables or fruit and whole grains. Drink plenty of fluids to keep your urine clear or pale yellow. Take warm sitz baths to soothe any pain or discomfort caused by hemorrhoids. Use hemorrhoid cream if your health care provider approves. If you develop varicose veins, wear support hose. Elevate your feet for 15 minutes, 3-4 times a day. Limit salt in your diet. Avoid heavy lifting, wear low heel shoes, and practice good posture. Rest with your legs elevated if you have leg cramps or low back pain. Visit your dentist if you have not gone yet during your pregnancy. Use a soft toothbrush to brush your teeth and be gentle when you floss. A sexual relationship may be continued unless your health care provider directs you otherwise. Continue to go to all your prenatal visits as directed by your health care provider. SEEK MEDICAL CARE IF:  You have dizziness. You have mild pelvic cramps, pelvic pressure, or nagging pain in the abdominal area. You have persistent nausea, vomiting, or diarrhea. You have a bad smelling vaginal discharge. You have pain with urination. SEEK IMMEDIATE MEDICAL CARE IF:  You have a fever. You are leaking fluid from your vagina. You have spotting or bleeding from your vagina. You have severe abdominal cramping or pain. You have rapid weight gain or loss. You have shortness of breath with chest pain. You notice sudden or extreme swelling  of your face, hands, ankles, feet, or legs. You have not felt your baby move in over an hour. You have severe headaches that do not go away with medicine. You have vision changes. Document Released: 08/30/2001 Document Revised: 09/10/2013 Document Reviewed: 11/06/2012 [facility] Patient Information 2015 [place], [place]. This information is not intended to replace advice given to you by your health care provider. Make sure you discuss any questions you have with your health care provider.

## 2022-03-23 NOTE — Progress Notes (Signed)
   LOW-RISK PREGNANCY VISIT Patient name: Katelyn Martinez MRN [id number]  Date of birth: 1996-08-20 Chief Complaint:   Routine Prenatal Visit  History of Present Illness:   Katelyn Martinez is a 26 y.o. G5P2002 female at [redacted]w[redacted]d with an Estimated Date of Delivery: 07/28/22 being seen today for ongoing management of a low-risk pregnancy.  Today she reports no complaints. Contractions: Not present. Vag. Bleeding: None.  Movement: Present. denies leaking of fluid. Review of Systems:   Pertinent items are noted in HPI Denies abnormal vaginal discharge w/ itching/odor/irritation, headaches, visual changes, shortness of breath, chest pain, abdominal pain, severe nausea/vomiting, or problems with urination or bowel movements unless otherwise stated above. Pertinent History Reviewed:  Reviewed past medical,surgical, social, obstetrical and family history.  Reviewed problem list, medications and allergies. Physical Assessment:   Vitals:   03/23/22 1134  BP: 107/66  Pulse: 78  Weight: 124 lb (56.2 kg)  Body mass index is 20.63 kg/m.        Physical Examination:   General appearance: Well appearing, and in no distress  Mental status: Alert, oriented to person, place, and time  Skin: Warm & dry  Cardiovascular: Normal heart rate noted  Respiratory: Normal respiratory effort, no distress  Abdomen: Soft, gravid, nontender  Pelvic: Cervical exam deferred         Extremities: Edema: None  Fetal Status: Fetal Heart Rate (bpm): 138 u/s   Movement: Present    Anatomy u/s: [place] 21+6 wks,breech,posterior placenta gr 0,normal ovaries,cx 2.9 cm,FHR 138 bpm,LVEICF,SVP of fluid 4.6 cm,EFW 441 g 34%,anatomy complete,no obvious abnormalities  No results found for this or any previous visit (from the past 24 hour(s)).  Assessment & Plan:  1) Low-risk pregnancy G3P2002 at [redacted]w[redacted]d with an Estimated Date of Delivery: 07/28/22   2) Hx GDM with 1st preg, PN2 at next visit   Meds: No orders of the defined types  were placed in this encounter.  Labs/procedures today: anatomy u/s  Plan:  Continue routine obstetrical care   Reviewed: Preterm labor symptoms and general obstetric precautions including but not limited to vaginal bleeding, contractions, leaking of fluid and fetal movement were reviewed in detail with the patient.  All questions were answered. Has home bp cuff. Check bp weekly, let us know if >140/90.   Follow-up: Return in about 29 days (around 04/21/2022) for LROB, PN2, in person.  No orders of the defined types were placed in this encounter.  Arabella Merles CNM 03/23/2022 12:13 PM

## 2022-03-23 NOTE — Progress Notes (Signed)
[place] 21+6 wks,breech,posterior placenta gr 0,normal ovaries,cx 2.9 cm,FHR 138 bpm,LVEICF,SVP of fluid 4.6 cm,EFW 441 g 34%,anatomy complete,no obvious abnormalities

## 2022-04-20 ENCOUNTER — Other Ambulatory Visit: Payer: Medicaid Other

## 2022-04-20 ENCOUNTER — Encounter: Payer: Self-pay | Admitting: Advanced Practice Midwife

## 2022-04-20 ENCOUNTER — Ambulatory Visit (INDEPENDENT_AMBULATORY_CARE_PROVIDER_SITE_OTHER): Payer: Medicaid Other | Admitting: Advanced Practice Midwife

## 2022-04-20 VITALS — BP 102/68 | HR 81 | Wt 127.4 lb

## 2022-04-20 DIAGNOSIS — Z3A25 25 weeks gestation of pregnancy: Secondary | ICD-10-CM

## 2022-04-20 DIAGNOSIS — Z3A26 26 weeks gestation of pregnancy: Secondary | ICD-10-CM | POA: Diagnosis not present

## 2022-04-20 DIAGNOSIS — Z3480 Encounter for supervision of other normal pregnancy, unspecified trimester: Secondary | ICD-10-CM

## 2022-04-20 DIAGNOSIS — Z131 Encounter for screening for diabetes mellitus: Secondary | ICD-10-CM | POA: Diagnosis not present

## 2022-04-20 DIAGNOSIS — Z23 Encounter for immunization: Secondary | ICD-10-CM

## 2022-04-20 NOTE — Progress Notes (Signed)
   LOW-RISK PREGNANCY VISIT Patient name: Katelyn Martinez MRN [id number]  Date of birth: 1996/09/06 Chief Complaint:   Routine Prenatal Visit (PN2)  History of Present Illness:   Katelyn Martinez is a 26 y.o. G57P2002 female at [redacted]w[redacted]d with an Estimated Date of Delivery: 07/28/22 being seen today for ongoing management of a low-risk pregnancy.  Today she reports no complaints. Contractions: Not present.  .  Movement: Present. denies leaking of fluid. Review of Systems:   Pertinent items are noted in HPI Denies abnormal vaginal discharge w/ itching/odor/irritation, headaches, visual changes, shortness of breath, chest pain, abdominal pain, severe nausea/vomiting, or problems with urination or bowel movements unless otherwise stated above. Pertinent History Reviewed:  Reviewed past medical,surgical, social, obstetrical and family history.  Reviewed problem list, medications and allergies. Physical Assessment:   Vitals:   04/20/22 0939  BP: 102/68  Pulse: 81  Weight: 127 lb 6.4 oz (57.8 kg)  Body mass index is 21.2 kg/m.        Physical Examination:   General appearance: Well appearing, and in no distress  Mental status: Alert, oriented to person, place, and time  Skin: Warm & dry  Cardiovascular: Normal heart rate noted  Respiratory: Normal respiratory effort, no distress  Abdomen: Soft, gravid, nontender  Pelvic: Cervical exam deferred         Extremities: Edema: None  Fetal Status: Fetal Heart Rate (bpm): 135 Fundal Height: 26 cm Movement: Present    No results found for this or any previous visit (from the past 24 hour(s)).  Assessment & Plan:  1) Low-risk pregnancy G3P2002 at [redacted]w[redacted]d with an Estimated Date of Delivery: 07/28/22   2) Hx GDM, PN2 today   Meds: No orders of the defined types were placed in this encounter.  Labs/procedures today: PN2, Tdap  Plan:  Continue routine obstetrical care   Reviewed: Preterm labor symptoms and general obstetric precautions including  but not limited to vaginal bleeding, contractions, leaking of fluid and fetal movement were reviewed in detail with the patient.  All questions were answered. Has home bp cuff. Check bp weekly, let us know if >140/90.   Follow-up: Return in about 4 weeks (around 05/18/2022) for LROB, in person.  Orders Placed This Encounter  Procedures   Tdap vaccine greater than or equal to 7yo IM   Arabella Merles [facility] 04/20/2022 10:18 AM

## 2022-04-20 NOTE — Patient Instructions (Signed)
Katelyn Martinez, thank you for choosing our office today! We appreciate the opportunity to meet your healthcare needs. You may receive a short survey by mail, e-mail, or through [employer]. If you are happy with your care we would appreciate if you could take just a few minutes to complete the survey questions. We read all of your comments and take your feedback very seriously. Thank you again for choosing our office.  Center for [employer] Team at [facility] at [facility] ([address] [place], [place] [address]) Entrance C, located off of [address] Free 24/7 valet parking  Go to [email or web address] to register for FREE online childbirth classes  Call the office [phone]) or go to [facility] if: You begin to severe cramping Your water breaks.  Sometimes it is a big gush of fluid, sometimes it is just a trickle that keeps getting your panties wet or running down your legs You have vaginal bleeding.  It is normal to have a small amount of spotting if your cervix was checked.   [facility] Pediatricians/Family Doctors [facility]): [address] Colette Ribas, [phone]           [facility]: [address], [phone]                [facility]): [address], [phone] (call to ask if accepting patients) [facility] Department: [address], [place], [phone]    [facility] Pediatricians/Family Doctors [facility]): [phone] [address], [phone] [facility]: [address] [place], [phone] [facility]: [address], [phone]  [facility] Doctors  [facility]): [phone] [facility]: [address] [phone]   [facility] Doctors [facility]: [address], [phone]  [facility] Doctors  [facility]: [phone], [phone]  Home Blood Pressure Monitoring for Patients   Your provider has recommended that you check your blood pressure (BP) at least once a week at home. If you do not have a blood pressure cuff at home, one will be provided for you. Contact your provider if you have not received your monitor within 1 week.   Helpful Tips for Accurate Home Blood Pressure Checks  Don't smoke, exercise, or drink caffeine 30 minutes before checking your BP Use the restroom before checking your BP (a full bladder can raise your pressure) Relax in a comfortable upright chair Feet on the ground Left arm resting comfortably on a flat surface at the level of your heart Legs uncrossed Back supported Sit quietly and don't talk Place the cuff on your bare arm Adjust snuggly, so that only two fingertips can fit between your skin and the top of the cuff Check 2 readings separated by at least one minute Keep a log of your BP readings For a visual, please reference this diagram: http://ccnc.care/bpdiagram  Provider Name: [facility]     Phone: [phone]  Zone 1: ALL CLEAR  Continue to monitor your symptoms:  BP reading is less than 140 (top number) or less than 90 (bottom number)  No right upper stomach pain No headaches or seeing spots No feeling nauseated or throwing up No swelling in face and hands  Zone 2: CAUTION Call your doctor's office for any of the following:  BP reading is greater than 140 (top number) or greater than  90 (bottom number)  Stomach pain under your ribs in the middle or right side Headaches or seeing spots Feeling nauseated or throwing up Swelling in face and hands  Zone 3: EMERGENCY  Seek immediate medical care if you have any of the following:  BP reading is greater than160 (top number) or greater than 110 (bottom number) Severe headaches not improving with Tylenol Serious difficulty catching your breath Any worsening symptoms from  Zone 2     Second Trimester of Pregnancy The second trimester is from week 14 through week 27 (months 4 through 6). The second trimester is often a time when you feel your best. Your body has adjusted to being pregnant, and you begin to feel better physically. Usually, morning sickness has lessened or quit completely, you may have more energy, and you may have an increase in appetite. The second trimester is also a time when the fetus is growing rapidly. At the end of the sixth month, the fetus is about 9 inches long and weighs about 1 pounds. You will likely begin to feel the baby move (quickening) between 16 and 20 weeks of pregnancy. Body changes during your second trimester Your body continues to go through many changes during your second trimester. The changes vary from woman to woman. Your weight will continue to increase. You will notice your lower abdomen bulging out. You may begin to get stretch marks on your hips, abdomen, and breasts. You may develop headaches that can be relieved by medicines. The medicines should be approved by your health care provider. You may urinate more often because the fetus is pressing on your bladder. You may develop or continue to have heartburn as a result of your pregnancy. You may develop constipation because certain hormones are causing the muscles that push waste through your intestines to slow down. You may develop hemorrhoids or swollen, bulging veins (varicose veins). You may have back pain. This is caused by: Weight gain. Pregnancy hormones that are relaxing the joints in your pelvis. A shift in weight and the muscles that support your balance. Your breasts will continue to grow and they will continue to become tender. Your gums may bleed and may be sensitive to brushing and flossing. Dark spots or blotches (chloasma, mask of pregnancy) may develop on your face. This will likely fade after the baby is born. A dark line from your belly button to  the pubic area (linea nigra) may appear. This will likely fade after the baby is born. You may have changes in your hair. These can include thickening of your hair, rapid growth, and changes in texture. Some women also have hair loss during or after pregnancy, or hair that feels dry or thin. Your hair will most likely return to normal after your baby is born.  What to expect at prenatal visits During a routine prenatal visit: You will be weighed to make sure you and the fetus are growing normally. Your blood pressure will be taken. Your abdomen will be measured to track your baby's growth. The fetal heartbeat will be listened to. Any test results from the previous visit will be discussed.  Your health care provider may ask you: How you are feeling. If you are feeling the baby move. If you have had any abnormal symptoms, such as leaking fluid, bleeding, severe headaches, or abdominal cramping. If you are using any tobacco products, including cigarettes, chewing tobacco, and electronic cigarettes. If you have any questions.  Other tests that may be performed during   your second trimester include: Blood tests that check for: Low iron levels (anemia). High blood sugar that affects pregnant women (gestational diabetes) between 24 and 28 weeks. Rh antibodies. This is to check for a protein on red blood cells (Rh factor). Urine tests to check for infections, diabetes, or protein in the urine. An ultrasound to confirm the proper growth and development of the baby. An amniocentesis to check for possible genetic problems. Fetal screens for spina bifida and Down syndrome. HIV (human immunodeficiency virus) testing. Routine prenatal testing includes screening for HIV, unless you choose not to have this test.  Follow these instructions at home: Medicines Follow your health care provider's instructions regarding medicine use. Specific medicines may be either safe or unsafe to take during  pregnancy. Take a prenatal vitamin that contains at least 600 micrograms (mcg) of folic acid. If you develop constipation, try taking a stool softener if your health care provider approves. Eating and drinking Eat a balanced diet that includes fresh fruits and vegetables, whole grains, good sources of protein such as meat, eggs, or tofu, and low-fat dairy. Your health care provider will help you determine the amount of weight gain that is right for you. Avoid raw meat and uncooked cheese. These carry germs that can cause birth defects in the baby. If you have low calcium intake from food, talk to your health care provider about whether you should take a daily calcium supplement. Limit foods that are high in fat and processed sugars, such as fried and sweet foods. To prevent constipation: Drink enough fluid to keep your urine clear or pale yellow. Eat foods that are high in fiber, such as fresh fruits and vegetables, whole grains, and beans. Activity Exercise only as directed by your health care provider. Most women can continue their usual exercise routine during pregnancy. Try to exercise for 30 minutes at least 5 days a week. Stop exercising if you experience uterine contractions. Avoid heavy lifting, wear low heel shoes, and practice good posture. A sexual relationship may be continued unless your health care provider directs you otherwise. Relieving pain and discomfort Wear a good support bra to prevent discomfort from breast tenderness. Take warm sitz baths to soothe any pain or discomfort caused by hemorrhoids. Use hemorrhoid cream if your health care provider approves. Rest with your legs elevated if you have leg cramps or low back pain. If you develop varicose veins, wear support hose. Elevate your feet for 15 minutes, 3-4 times a day. Limit salt in your diet. Prenatal Care Write down your questions. Take them to your prenatal visits. Keep all your prenatal visits as told by your health  care provider. This is important. Safety Wear your seat belt at all times when driving. Make a list of emergency phone numbers, including numbers for family, friends, the hospital, and police and fire departments. General instructions Ask your health care provider for a referral to a local prenatal education class. Begin classes no later than the beginning of month 6 of your pregnancy. Ask for help if you have counseling or nutritional needs during pregnancy. Your health care provider can offer advice or refer you to specialists for help with various needs. Do not use hot tubs, steam rooms, or saunas. Do not douche or use tampons or scented sanitary pads. Do not cross your legs for long periods of time. Avoid cat litter boxes and soil used by cats. These carry germs that can cause birth defects in the baby and possibly loss of the   fetus by miscarriage or stillbirth. Avoid all smoking, herbs, alcohol, and unprescribed drugs. Chemicals in these products can affect the formation and growth of the baby. Do not use any products that contain nicotine or tobacco, such as cigarettes and e-cigarettes. If you need help quitting, ask your health care provider. Visit your dentist if you have not gone yet during your pregnancy. Use a soft toothbrush to brush your teeth and be gentle when you floss. Contact a health care provider if: You have dizziness. You have mild pelvic cramps, pelvic pressure, or nagging pain in the abdominal area. You have persistent nausea, vomiting, or diarrhea. You have a bad smelling vaginal discharge. You have pain when you urinate. Get help right away if: You have a fever. You are leaking fluid from your vagina. You have spotting or bleeding from your vagina. You have severe abdominal cramping or pain. You have rapid weight gain or weight loss. You have shortness of breath with chest pain. You notice sudden or extreme swelling of your face, hands, ankles, feet, or legs. You  have not felt your baby move in over an hour. You have severe headaches that do not go away when you take medicine. You have vision changes. Summary The second trimester is from week 14 through week 27 (months 4 through 6). It is also a time when the fetus is growing rapidly. Your body goes through many changes during pregnancy. The changes vary from woman to woman. Avoid all smoking, herbs, alcohol, and unprescribed drugs. These chemicals affect the formation and growth your baby. Do not use any tobacco products, such as cigarettes, chewing tobacco, and e-cigarettes. If you need help quitting, ask your health care provider. Contact your health care provider if you have any questions. Keep all prenatal visits as told by your health care provider. This is important. This information is not intended to replace advice given to you by your health care provider. Make sure you discuss any questions you have with your health care provider. Document Released: 08/30/2001 Document Revised: 02/11/2016 Document Reviewed: 11/06/2012 Elsevier Interactive Patient Education  2017 Elsevier Inc.  

## 2022-04-21 LAB — CBC
Hematocrit: 34.5 % (ref 34.0–46.6)
Hemoglobin: 11.6 g/dL (ref 11.1–15.9)
MCH: 27.4 pg (ref 26.6–33.0)
MCHC: 33.6 g/dL (ref 31.5–35.7)
MCV: 82 fL (ref 79–97)
Platelets: 230 10*3/uL (ref 150–450)
RBC: 4.23 x10E6/uL (ref 3.77–5.28)
RDW: 13 % (ref 11.7–15.4)
WBC: 7 10*3/uL (ref 3.4–10.8)

## 2022-04-21 LAB — RPR: RPR Ser Ql: NONREACTIVE

## 2022-04-21 LAB — GLUCOSE TOLERANCE, 2 HOURS W/ 1HR
Glucose, 1 hour: 113 mg/dL (ref 70–179)
Glucose, 2 hour: 56 mg/dL — ABNORMAL LOW (ref 70–152)
Glucose, Fasting: 67 mg/dL — ABNORMAL LOW (ref 70–91)

## 2022-04-21 LAB — ANTIBODY SCREEN: Antibody Screen: NEGATIVE

## 2022-04-21 LAB — HIV ANTIBODY (ROUTINE TESTING W REFLEX): HIV Screen 4th Generation wRfx: NONREACTIVE

## 2022-05-16 ENCOUNTER — Encounter: Payer: Self-pay | Admitting: Women's Health

## 2022-05-16 ENCOUNTER — Ambulatory Visit (INDEPENDENT_AMBULATORY_CARE_PROVIDER_SITE_OTHER): Payer: Medicaid Other | Admitting: Women's Health

## 2022-05-16 ENCOUNTER — Other Ambulatory Visit (HOSPITAL_COMMUNITY)
Admission: RE | Admit: 2022-05-16 | Discharge: 2022-05-16 | Disposition: A | Discharge status: Home / Self Care | Payer: Medicaid Other | Source: Ambulatory Visit | Attending: Advanced Practice Midwife | Admitting: Advanced Practice Midwife

## 2022-05-16 VITALS — BP 109/64 | HR 89 | Wt 134.0 lb

## 2022-05-16 DIAGNOSIS — Z3483 Encounter for supervision of other normal pregnancy, third trimester: Secondary | ICD-10-CM

## 2022-05-16 DIAGNOSIS — L292 Pruritus vulvae: Secondary | ICD-10-CM

## 2022-05-16 NOTE — Progress Notes (Signed)
LOW-RISK PREGNANCY VISIT Patient name: Katelyn Martinez MRN [id number]  Date of birth: 10/30/1995 Chief Complaint:   Routine Prenatal Visit (Vaginal discharge)  History of Present Illness:   Katelyn Martinez is a 26 y.o. G71P2002 female at [redacted]w[redacted]d with an Estimated Date of Delivery: 07/28/22 being seen today for ongoing management of a low-risk pregnancy.   Today she reports  vulvar itching/irritation x few days, tried otc yeast cream but didn't help . Contractions: Not present. Vag. Bleeding: None.  Movement: Present. denies leaking of fluid.     04/20/2022   10:07 AM 01/17/2022   10:43 AM 05/12/2021    9:29 AM 01/27/2021    9:13 AM 08/03/2020   12:03 PM  Depression screen PHQ 2/9  Decreased Interest 0 0 0 2 1  Down, Depressed, Hopeless 0 0 0 2 1  PHQ - 2 Score 0 0 0 4 2  Altered sleeping 2 1 1 2 1   Tired, decreased energy 1 2 1 2 1   Change in appetite 0 1 0 2 1  Feeling bad or failure about yourself  0 0 0 2 1  Trouble concentrating 0 0 0 2 1  Moving slowly or fidgety/restless 0 0 0 2 1  Suicidal thoughts 0 0 0 2 0  PHQ-9 Score 3 4 2 18 8         04/20/2022   10:07 AM 01/17/2022   10:43 AM 05/12/2021    9:30 AM 01/27/2021    9:14 AM  GAD 7 : Generalized Anxiety Score  Nervous, Anxious, on Edge 0 2 1 2   Control/stop worrying 0 1 1 2   Worry too much - different things 0 1 1 2   Trouble relaxing 2 1 1 2   Restless 0 0 1 2  Easily annoyed or irritable 0 0 1 2  Afraid - awful might happen 0 0 0 2  Total GAD 7 Score 2 5 6 14       Review of Systems:   Pertinent items are noted in HPI Denies abnormal vaginal discharge w/ itching/odor/irritation, headaches, visual changes, shortness of breath, chest pain, abdominal pain, severe nausea/vomiting, or problems with urination or bowel movements unless otherwise stated above. Pertinent History Reviewed:  Reviewed past medical,surgical, social, obstetrical and family history.  Reviewed problem list, medications and allergies. Physical  Assessment:   Vitals:   05/16/22 1554  BP: 109/64  Pulse: 89  Weight: 134 lb (60.8 kg)  Body mass index is 22.3 kg/m.        Physical Examination:   General appearance: Well appearing, and in no distress  Mental status: Alert, oriented to person, place, and time  Skin: Warm & dry  Cardiovascular: Normal heart rate noted  Respiratory: Normal respiratory effort, no distress  Abdomen: Soft, gravid, nontender  Pelvic: spec exam: cx visually long/closed, small amt d/c        Extremities: Edema: None  Fetal Status: Fetal Heart Rate (bpm): 143 Fundal Height: 28 cm Movement: Present    Chaperone: Latisha Cresenzo   No results found for this or any previous visit (from the past 24 hour(s)).  Assessment & Plan:  1) Low-risk pregnancy G3P2002 at [redacted]w[redacted]d with an Estimated Date of Delivery: 07/28/22   2) Vulvar itching/irritation, CV swab sent   Meds: No orders of the defined types were placed in this encounter.  Labs/procedures today: spec exam and CV swab  Plan:  Continue routine obstetrical care  Next visit: prefers in person    Reviewed: Preterm  labor symptoms and general obstetric precautions including but not limited to vaginal bleeding, contractions, leaking of fluid and fetal movement were reviewed in detail with the patient.  All questions were answered. Does have home bp cuff. Office bp cuff given: not applicable. Check bp weekly, let us know if consistently >140 and/or >90.  Follow-up: Return in about 2 weeks (around 05/30/2022) for LROB, CNM, in person.  Future Appointments  Date Time Provider Department Center  06/01/2022  8:50 AM Arabella Merles, CNM [facility]    No orders of the defined types were placed in this encounter.  Cheral Marker CNM, [facility] 05/16/2022 4:12 PM

## 2022-05-16 NOTE — Patient Instructions (Signed)
Joane, thank you for choosing our office today! We appreciate the opportunity to meet your healthcare needs. You may receive a short survey by mail, e-mail, or through MyChart. If you are happy with your care we would appreciate if you could take just a few minutes to complete the survey questions. We read all of your comments and take your feedback very seriously. Thank you again for choosing our office.  [facility] ([address] [place], [place] [address]) Entrance C, located off of [address] Free 24/7 valet parking   CLASSES: Go to [email or web address] to register for classes (childbirth, breastfeeding, waterbirth, infant CPR, daddy bootcamp, etc.)  Call the office ([phone]) or go to [facility] if: You begin to have strong, frequent contractions Your water breaks.  Sometimes it is a big gush of fluid, sometimes it is just a trickle that keeps getting your panties wet or running down your legs You have vaginal bleeding.  It is normal to have a small amount of spotting if your cervix was checked.  You don't feel your baby moving like normal.  If you don't, get you something to eat and drink and lay down and focus on feeling your baby move.   If your baby is still not moving like normal, you should call the office or go to [facility].  Call the office ([phone]) or go to [facility] for these signs of pre-eclampsia: Severe headache that does not go away with Tylenol Visual changes- seeing spots, double, blurred vision Pain under your right breast or upper abdomen that does not go away with Tums or heartburn medicine Nausea and/or vomiting Severe swelling in your hands, feet, and face   Tdap Vaccine It is recommended that you get the Tdap vaccine during the third trimester of EACH pregnancy to help protect your baby from getting pertussis (whooping cough) 27-36 weeks is the BEST time to do  this so that you can pass the protection on to your baby. During pregnancy is better than after pregnancy, but if you are unable to get it during pregnancy it will be offered at the hospital.  You can get this vaccine with us, at the health department, your family doctor, or some local pharmacies Everyone who will be around your baby should also be up-to-date on their vaccines before the baby comes. Adults (who are not pregnant) only need 1 dose of Tdap during adulthood.   [place] [facility] ([facility]): [address], [phone]           [facility]: [address], [phone]                [facility] ([facility]): [address], [phone] (call to ask if accepting patients) [facility]: [address], [place], [phone]    [place] [facility]/Family Doctors [facility] ([facility]): [address], [phone] [facility]: [address], [phone] [facility]: [address], [phone]  [place] [facility]  [facility] ([facility]): [phone] [facility]: [address], [phone]   [place] [facility]: [address], [phone]  [place] [facility]  [facility]: [address], [phone]  Home Blood Pressure Monitoring for Patients   Your provider has recommended that you check your   blood pressure (BP) at least once a week at home. If you do not have a blood pressure cuff at home, one will be provided for you. Contact your provider if you have not received your monitor within 1 week.   Helpful Tips for Accurate Home Blood Pressure Checks  Don't smoke, exercise, or drink caffeine 30 minutes before checking your BP Use the restroom before checking your BP (a full bladder can raise your  pressure) Relax in a comfortable upright chair Feet on the ground Left arm resting comfortably on a flat surface at the level of your heart Legs uncrossed Back supported Sit quietly and don't talk Place the cuff on your bare arm Adjust snuggly, so that only two fingertips can fit between your skin and the top of the cuff Check 2 readings separated by at least one minute Keep a log of your BP readings For a visual, please reference this diagram: http://ccnc.care/bpdiagram  Provider Name: [facility]     Phone: [phone]  Zone 1: ALL CLEAR  Continue to monitor your symptoms:  BP reading is less than 140 (top number) or less than 90 (bottom number)  No right upper stomach pain No headaches or seeing spots No feeling nauseated or throwing up No swelling in face and hands  Zone 2: CAUTION Call your doctor's office for any of the following:  BP reading is greater than 140 (top number) or greater than 90 (bottom number)  Stomach pain under your ribs in the middle or right side Headaches or seeing spots Feeling nauseated or throwing up Swelling in face and hands  Zone 3: EMERGENCY  Seek immediate medical care if you have any of the following:  BP reading is greater than160 (top number) or greater than 110 (bottom number) Severe headaches not improving with Tylenol Serious difficulty catching your breath Any worsening symptoms from Zone 2   Third Trimester of Pregnancy The third trimester is from week 29 through week 42, months 7 through 9. The third trimester is a time when the fetus is growing rapidly. At the end of the ninth month, the fetus is about 20 inches in length and weighs 6-10 pounds.  BODY CHANGES Your body goes through many changes during pregnancy. The changes vary from woman to woman.  Your weight will continue to increase. You can expect to gain 25-35 pounds (11-16 kg) by the end of the pregnancy. You may begin to get stretch marks on your hips, abdomen,  and breasts. You may urinate more often because the fetus is moving lower into your pelvis and pressing on your bladder. You may develop or continue to have heartburn as a result of your pregnancy. You may develop constipation because certain hormones are causing the muscles that push waste through your intestines to slow down. You may develop hemorrhoids or swollen, bulging veins (varicose veins). You may have pelvic pain because of the weight gain and pregnancy hormones relaxing your joints between the bones in your pelvis. Backaches may result from overexertion of the muscles supporting your posture. You may have changes in your hair. These can include thickening of your hair, rapid growth, and changes in texture. Some women also have hair loss during or after pregnancy, or hair that feels dry or thin. Your hair will most likely return to normal after your baby is born. Your breasts will continue to grow and be tender. A yellow discharge may leak from your breasts called colostrum. Your belly button may stick out. You may   feel short of breath because of your expanding uterus. You may notice the fetus "dropping," or moving lower in your abdomen. You may have a bloody mucus discharge. This usually occurs a few days to a week before labor begins. Your cervix becomes thin and soft (effaced) near your due date. WHAT TO EXPECT AT YOUR PRENATAL EXAMS  You will have prenatal exams every 2 weeks until week 36. Then, you will have weekly prenatal exams. During a routine prenatal visit: You will be weighed to make sure you and the fetus are growing normally. Your blood pressure is taken. Your abdomen will be measured to track your baby's growth. The fetal heartbeat will be listened to. Any test results from the previous visit will be discussed. You may have a cervical check near your due date to see if you have effaced. At around 36 weeks, your caregiver will check your cervix. At the same time, your  caregiver will also perform a test on the secretions of the vaginal tissue. This test is to determine if a type of bacteria, Group B streptococcus, is present. Your caregiver will explain this further. Your caregiver may ask you: What your birth plan is. How you are feeling. If you are feeling the baby move. If you have had any abnormal symptoms, such as leaking fluid, bleeding, severe headaches, or abdominal cramping. If you have any questions. Other tests or screenings that may be performed during your third trimester include: Blood tests that check for low iron levels (anemia). Fetal testing to check the health, activity level, and growth of the fetus. Testing is done if you have certain medical conditions or if there are problems during the pregnancy. FALSE LABOR You may feel small, irregular contractions that eventually go away. These are called Braxton Hicks contractions, or false labor. Contractions may last for hours, days, or even weeks before true labor sets in. If contractions come at regular intervals, intensify, or become painful, it is best to be seen by your caregiver.  SIGNS OF LABOR  Menstrual-like cramps. Contractions that are 5 minutes apart or less. Contractions that start on the top of the uterus and spread down to the lower abdomen and back. A sense of increased pelvic pressure or back pain. A watery or bloody mucus discharge that comes from the vagina. If you have any of these signs before the 37th week of pregnancy, call your caregiver right away. You need to go to the hospital to get checked immediately. HOME CARE INSTRUCTIONS  Avoid all smoking, herbs, alcohol, and unprescribed drugs. These chemicals affect the formation and growth of the baby. Follow your caregiver's instructions regarding medicine use. There are medicines that are either safe or unsafe to take during pregnancy. Exercise only as directed by your caregiver. Experiencing uterine cramps is a good sign to  stop exercising. Continue to eat regular, healthy meals. Wear a good support bra for breast tenderness. Do not use hot tubs, steam rooms, or saunas. Wear your seat belt at all times when driving. Avoid raw meat, uncooked cheese, cat litter boxes, and soil used by cats. These carry germs that can cause birth defects in the baby. Take your prenatal vitamins. Try taking a stool softener (if your caregiver approves) if you develop constipation. Eat more high-fiber foods, such as fresh vegetables or fruit and whole grains. Drink plenty of fluids to keep your urine clear or pale yellow. Take warm sitz baths to soothe any pain or discomfort caused by hemorrhoids. Use hemorrhoid cream if   your caregiver approves. If you develop varicose veins, wear support hose. Elevate your feet for 15 minutes, 3-4 times a day. Limit salt in your diet. Avoid heavy lifting, wear low heal shoes, and practice good posture. Rest a lot with your legs elevated if you have leg cramps or low back pain. Visit your dentist if you have not gone during your pregnancy. Use a soft toothbrush to brush your teeth and be gentle when you floss. A sexual relationship may be continued unless your caregiver directs you otherwise. Do not travel far distances unless it is absolutely necessary and only with the approval of your caregiver. Take prenatal classes to understand, practice, and ask questions about the labor and delivery. Make a trial run to the hospital. Pack your hospital bag. Prepare the baby's nursery. Continue to go to all your prenatal visits as directed by your caregiver. SEEK MEDICAL CARE IF: You are unsure if you are in labor or if your water has broken. You have dizziness. You have mild pelvic cramps, pelvic pressure, or nagging pain in your abdominal area. You have persistent nausea, vomiting, or diarrhea. You have a bad smelling vaginal discharge. You have pain with urination. SEEK IMMEDIATE MEDICAL CARE IF:  You  have a fever. You are leaking fluid from your vagina. You have spotting or bleeding from your vagina. You have severe abdominal cramping or pain. You have rapid weight loss or gain. You have shortness of breath with chest pain. You notice sudden or extreme swelling of your face, hands, ankles, feet, or legs. You have not felt your baby move in over an hour. You have severe headaches that do not go away with medicine. You have vision changes. Document Released: 08/30/2001 Document Revised: 09/10/2013 Document Reviewed: 11/06/2012 Exit[employer] Patient Information 2015 [employer]. This information is not intended to replace advice given to you by your health care provider. Make sure you discuss any questions you have with your health care provider.       

## 2022-05-18 LAB — CERVICOVAGINAL ANCILLARY ONLY
Bacterial Vaginitis (gardnerella): NEGATIVE
Candida Glabrata: NEGATIVE
Candida Vaginitis: POSITIVE — AB
Chlamydia: NEGATIVE
Comment: NEGATIVE
Comment: NEGATIVE
Comment: NEGATIVE
Comment: NEGATIVE
Comment: NEGATIVE
Comment: NORMAL
Neisseria Gonorrhea: NEGATIVE
Trichomonas: NEGATIVE

## 2022-05-19 ENCOUNTER — Other Ambulatory Visit: Payer: Self-pay | Admitting: Obstetrics & Gynecology

## 2022-05-19 ENCOUNTER — Telehealth: Payer: Self-pay

## 2022-05-19 DIAGNOSIS — L292 Pruritus vulvae: Secondary | ICD-10-CM

## 2022-05-19 MED ORDER — CVS CLOTRIMAZOLE 3 2 % VA CREA: 1.0000 | TOPICAL_CREAM | Freq: Every day | VAGINAL | 0 refills | Status: AC

## 2022-05-19 NOTE — Telephone Encounter (Signed)
Pt used OTC Monistat 7 day treatment and got no results. Please advise. Thanks! JSY

## 2022-05-19 NOTE — Telephone Encounter (Signed)
Patient would like for something to be sent in for a yeast infection and she would like it sent to the [facility] in [place] not [place].

## 2022-05-19 NOTE — Progress Notes (Signed)
Rx for clotrimazole

## 2022-05-20 ENCOUNTER — Encounter: Payer: Medicaid Other | Admitting: Advanced Practice Midwife

## 2022-06-01 ENCOUNTER — Ambulatory Visit (INDEPENDENT_AMBULATORY_CARE_PROVIDER_SITE_OTHER): Payer: Medicaid Other | Admitting: Advanced Practice Midwife

## 2022-06-01 ENCOUNTER — Encounter: Payer: Self-pay | Admitting: Advanced Practice Midwife

## 2022-06-01 VITALS — BP 103/68 | HR 87 | Wt 131.0 lb

## 2022-06-01 DIAGNOSIS — Z3480 Encounter for supervision of other normal pregnancy, unspecified trimester: Secondary | ICD-10-CM

## 2022-06-01 DIAGNOSIS — Z3A31 31 weeks gestation of pregnancy: Secondary | ICD-10-CM

## 2022-06-01 NOTE — Patient Instructions (Signed)
Romaine, thank you for choosing our office today! We appreciate the opportunity to meet your healthcare needs. You may receive a short survey by mail, e-mail, or through MyChart. If you are happy with your care we would appreciate if you could take just a few minutes to complete the survey questions. We read all of your comments and take your feedback very seriously. Thank you again for choosing our office.  [facility]  [facility] ([address] [place], [place] [address]) Entrance C, located off of [address] Free 24/7 valet parking   CLASSES: Go to [email or web address] to register for classes (childbirth, breastfeeding, waterbirth, infant CPR, daddy bootcamp, etc.)  Call the office ([phone]) or go to [facility] if: You begin to have strong, frequent contractions Your water breaks.  Sometimes it is a big gush of fluid, sometimes it is just a trickle that keeps getting your panties wet or running down your legs You have vaginal bleeding.  It is normal to have a small amount of spotting if your cervix was checked.  You don't feel your baby moving like normal.  If you don't, get you something to eat and drink and lay down and focus on feeling your baby move.   If your baby is still not moving like normal, you should call the office or go to [facility].  Call the office ([phone]) or go to [facility] for these signs of pre-eclampsia: Severe headache that does not go away with Tylenol Visual changes- seeing spots, double, blurred vision Pain under your right breast or upper abdomen that does not go away with Tums or heartburn medicine Nausea and/or vomiting Severe swelling in your hands, feet, and face   Tdap Vaccine It is recommended that you get the Tdap vaccine during the third trimester of EACH pregnancy to help protect your baby from getting pertussis (whooping cough) 27-36 weeks is the BEST time to do  this so that you can pass the protection on to your baby. During pregnancy is better than after pregnancy, but if you are unable to get it during pregnancy it will be offered at the hospital.  You can get this vaccine with us, at the health department, your family doctor, or some local pharmacies Everyone who will be around your baby should also be up-to-date on their vaccines before the baby comes. Adults (who are not pregnant) only need 1 dose of Tdap during adulthood.   [place] Pediatricians/Family Doctors [facility] ([facility]): [address], [phone]           [facility]: [address], [phone]                [facility] ([facility]): [address], [phone] (call to ask if accepting patients) [facility]: [address], [place], [phone]    [place] Pediatricians/Family Doctors [facility] ([facility]): [address], [phone] [facility]: [address], [phone] [facility]: [address], [phone]  [place] [facility]  [facility] ([facility]): [phone] [facility]: [address], [phone]   [place] [facility]: [address], [phone]  [place] [facility]  [facility]: [address], [phone]  Home Blood Pressure Monitoring for Patients   Your provider has recommended that you check your   blood pressure (BP) at least once a week at home. If you do not have a blood pressure cuff at home, one will be provided for you. Contact your provider if you have not received your monitor within 1 week.   Helpful Tips for Accurate Home Blood Pressure Checks  Don't smoke, exercise, or drink caffeine 30 minutes before checking your BP Use the restroom before checking your BP (a full bladder can raise your  pressure) Relax in a comfortable upright chair Feet on the ground Left arm resting comfortably on a flat surface at the level of your heart Legs uncrossed Back supported Sit quietly and don't talk Place the cuff on your bare arm Adjust snuggly, so that only two fingertips can fit between your skin and the top of the cuff Check 2 readings separated by at least one minute Keep a log of your BP readings For a visual, please reference this diagram: http://ccnc.care/bpdiagram  Provider Name: [facility]     Phone: [phone]  Zone 1: ALL CLEAR  Continue to monitor your symptoms:  BP reading is less than 140 (top number) or less than 90 (bottom number)  No right upper stomach pain No headaches or seeing spots No feeling nauseated or throwing up No swelling in face and hands  Zone 2: CAUTION Call your doctor's office for any of the following:  BP reading is greater than 140 (top number) or greater than 90 (bottom number)  Stomach pain under your ribs in the middle or right side Headaches or seeing spots Feeling nauseated or throwing up Swelling in face and hands  Zone 3: EMERGENCY  Seek immediate medical care if you have any of the following:  BP reading is greater than160 (top number) or greater than 110 (bottom number) Severe headaches not improving with Tylenol Serious difficulty catching your breath Any worsening symptoms from Zone 2   Third Trimester of Pregnancy The third trimester is from week 29 through week 42, months 7 through 9. The third trimester is a time when the fetus is growing rapidly. At the end of the ninth month, the fetus is about 20 inches in length and weighs 6-10 pounds.  BODY CHANGES Your body goes through many changes during pregnancy. The changes vary from woman to woman.  Your weight will continue to increase. You can expect to gain 25-35 pounds (11-16 kg) by the end of the pregnancy. You may begin to get stretch marks on your hips, abdomen,  and breasts. You may urinate more often because the fetus is moving lower into your pelvis and pressing on your bladder. You may develop or continue to have heartburn as a result of your pregnancy. You may develop constipation because certain hormones are causing the muscles that push waste through your intestines to slow down. You may develop hemorrhoids or swollen, bulging veins (varicose veins). You may have pelvic pain because of the weight gain and pregnancy hormones relaxing your joints between the bones in your pelvis. Backaches may result from overexertion of the muscles supporting your posture. You may have changes in your hair. These can include thickening of your hair, rapid growth, and changes in texture. Some women also have hair loss during or after pregnancy, or hair that feels dry or thin. Your hair will most likely return to normal after your baby is born. Your breasts will continue to grow and be tender. A yellow discharge may leak from your breasts called colostrum. Your belly button may stick out. You may   feel short of breath because of your expanding uterus. You may notice the fetus "dropping," or moving lower in your abdomen. You may have a bloody mucus discharge. This usually occurs a few days to a week before labor begins. Your cervix becomes thin and soft (effaced) near your due date. WHAT TO EXPECT AT YOUR PRENATAL EXAMS  You will have prenatal exams every 2 weeks until week 36. Then, you will have weekly prenatal exams. During a routine prenatal visit: You will be weighed to make sure you and the fetus are growing normally. Your blood pressure is taken. Your abdomen will be measured to track your baby's growth. The fetal heartbeat will be listened to. Any test results from the previous visit will be discussed. You may have a cervical check near your due date to see if you have effaced. At around 36 weeks, your caregiver will check your cervix. At the same time, your  caregiver will also perform a test on the secretions of the vaginal tissue. This test is to determine if a type of bacteria, Group B streptococcus, is present. Your caregiver will explain this further. Your caregiver may ask you: What your birth plan is. How you are feeling. If you are feeling the baby move. If you have had any abnormal symptoms, such as leaking fluid, bleeding, severe headaches, or abdominal cramping. If you have any questions. Other tests or screenings that may be performed during your third trimester include: Blood tests that check for low iron levels (anemia). Fetal testing to check the health, activity level, and growth of the fetus. Testing is done if you have certain medical conditions or if there are problems during the pregnancy. FALSE LABOR You may feel small, irregular contractions that eventually go away. These are called Braxton Hicks contractions, or false labor. Contractions may last for hours, days, or even weeks before true labor sets in. If contractions come at regular intervals, intensify, or become painful, it is best to be seen by your caregiver.  SIGNS OF LABOR  Menstrual-like cramps. Contractions that are 5 minutes apart or less. Contractions that start on the top of the uterus and spread down to the lower abdomen and back. A sense of increased pelvic pressure or back pain. A watery or bloody mucus discharge that comes from the vagina. If you have any of these signs before the 37th week of pregnancy, call your caregiver right away. You need to go to the hospital to get checked immediately. HOME CARE INSTRUCTIONS  Avoid all smoking, herbs, alcohol, and unprescribed drugs. These chemicals affect the formation and growth of the baby. Follow your caregiver's instructions regarding medicine use. There are medicines that are either safe or unsafe to take during pregnancy. Exercise only as directed by your caregiver. Experiencing uterine cramps is a good sign to  stop exercising. Continue to eat regular, healthy meals. Wear a good support bra for breast tenderness. Do not use hot tubs, steam rooms, or saunas. Wear your seat belt at all times when driving. Avoid raw meat, uncooked cheese, cat litter boxes, and soil used by cats. These carry germs that can cause birth defects in the baby. Take your prenatal vitamins. Try taking a stool softener (if your caregiver approves) if you develop constipation. Eat more high-fiber foods, such as fresh vegetables or fruit and whole grains. Drink plenty of fluids to keep your urine clear or pale yellow. Take warm sitz baths to soothe any pain or discomfort caused by hemorrhoids. Use hemorrhoid cream if   your caregiver approves. If you develop varicose veins, wear support hose. Elevate your feet for 15 minutes, 3-4 times a day. Limit salt in your diet. Avoid heavy lifting, wear low heal shoes, and practice good posture. Rest a lot with your legs elevated if you have leg cramps or low back pain. Visit your dentist if you have not gone during your pregnancy. Use a soft toothbrush to brush your teeth and be gentle when you floss. A sexual relationship may be continued unless your caregiver directs you otherwise. Do not travel far distances unless it is absolutely necessary and only with the approval of your caregiver. Take prenatal classes to understand, practice, and ask questions about the labor and delivery. Make a trial run to the hospital. Pack your hospital bag. Prepare the baby's nursery. Continue to go to all your prenatal visits as directed by your caregiver. SEEK MEDICAL CARE IF: You are unsure if you are in labor or if your water has broken. You have dizziness. You have mild pelvic cramps, pelvic pressure, or nagging pain in your abdominal area. You have persistent nausea, vomiting, or diarrhea. You have a bad smelling vaginal discharge. You have pain with urination. SEEK IMMEDIATE MEDICAL CARE IF:  You  have a fever. You are leaking fluid from your vagina. You have spotting or bleeding from your vagina. You have severe abdominal cramping or pain. You have rapid weight loss or gain. You have shortness of breath with chest pain. You notice sudden or extreme swelling of your face, hands, ankles, feet, or legs. You have not felt your baby move in over an hour. You have severe headaches that do not go away with medicine. You have vision changes. Document Released: 08/30/2001 Document Revised: 09/10/2013 Document Reviewed: 11/06/2012 [facility] Patient Information 2015 [place], [place]. This information is not intended to replace advice given to you by your health care provider. Make sure you discuss any questions you have with your health care provider.

## 2022-06-01 NOTE — Progress Notes (Signed)
   LOW-RISK PREGNANCY VISIT Patient name: Katelyn Martinez MRN [id number]  Date of birth: 1995/11/02 Chief Complaint:   Routine Prenatal Visit  History of Present Illness:   Katelyn Martinez is a 26 y.o. G59P2002 female at [redacted]w[redacted]d with an Estimated Date of Delivery: 07/28/22 being seen today for ongoing management of a low-risk pregnancy.  Today she reports  appetite a little down; some leg cramps . Contractions: Irritability. Vag. Bleeding: None.  Movement: Present. denies leaking of fluid. Review of Systems:   Pertinent items are noted in HPI Denies abnormal vaginal discharge w/ itching/odor/irritation, headaches, visual changes, shortness of breath, chest pain, abdominal pain, severe nausea/vomiting, or problems with urination or bowel movements unless otherwise stated above. Pertinent History Reviewed:  Reviewed past medical,surgical, social, obstetrical and family history.  Reviewed problem list, medications and allergies. Physical Assessment:   Vitals:   06/01/22 0905  BP: 103/68  Pulse: 87  Weight: 131 lb (59.4 kg)  Body mass index is 21.8 kg/m.        Physical Examination:   General appearance: Well appearing, and in no distress  Mental status: Alert, oriented to person, place, and time  Skin: Warm & dry  Cardiovascular: Normal heart rate noted  Respiratory: Normal respiratory effort, no distress  Abdomen: Soft, gravid, nontender  Pelvic: Cervical exam deferred         Extremities: Edema: None  Fetal Status: Fetal Heart Rate (bpm): 129 Fundal Height: 31 cm Movement: Present    No results found for this or any previous visit (from the past 24 hour(s)).  Assessment & Plan:  1) Low-risk pregnancy G3P2002 at [redacted]w[redacted]d with an Estimated Date of Delivery: 07/28/22     Meds: No orders of the defined types were placed in this encounter.  Labs/procedures today: none  Plan:  Continue routine obstetrical care   Reviewed: Preterm labor symptoms and general obstetric precautions  including but not limited to vaginal bleeding, contractions, leaking of fluid and fetal movement were reviewed in detail with the patient.  All questions were answered. Has home bp cuff. Check bp weekly, let us know if >140/90.   Follow-up: Return in about 2 weeks (around 06/15/2022) for LROB, in person.  No orders of the defined types were placed in this encounter.  Arabella Merles CNM 06/01/2022 9:22 AM

## 2022-06-15 ENCOUNTER — Encounter: Payer: Self-pay | Admitting: Advanced Practice Midwife

## 2022-06-15 ENCOUNTER — Ambulatory Visit (INDEPENDENT_AMBULATORY_CARE_PROVIDER_SITE_OTHER): Payer: Medicaid Other | Admitting: Advanced Practice Midwife

## 2022-06-15 VITALS — BP 109/70 | HR 80 | Wt 135.0 lb

## 2022-06-15 DIAGNOSIS — Z3480 Encounter for supervision of other normal pregnancy, unspecified trimester: Secondary | ICD-10-CM

## 2022-06-15 DIAGNOSIS — Z3A33 33 weeks gestation of pregnancy: Secondary | ICD-10-CM

## 2022-06-15 DIAGNOSIS — B3731 Acute candidiasis of vulva and vagina: Secondary | ICD-10-CM

## 2022-06-15 MED ORDER — FLUCONAZOLE 150 MG PO TABS: 150.0000 mg | ORAL_TABLET | Freq: Once | ORAL | 0 refills | Status: AC

## 2022-06-15 NOTE — Progress Notes (Signed)
   LOW-RISK PREGNANCY VISIT Patient name: Katelyn Martinez MRN [id number]  Date of birth: [birth year] Chief Complaint:   Routine Prenatal Visit  History of Present Illness:   Katelyn Martinez is a 26 y.o. G17P2002 female at [redacted]w[redacted]d with an Estimated Date of Delivery: 07/28/22 being seen today for ongoing management of a low-risk pregnancy.  Today she reports  having some ctx q day (no pattern); used clotrimazole for yeast last month and now itching is back; feels baby has 'dropped'  . Contractions: Irritability. Vag. Bleeding: None.  Movement: Present. denies leaking of fluid. Review of Systems:   Pertinent items are noted in HPI Denies abnormal vaginal discharge w/ itching/odor/irritation, headaches, visual changes, shortness of breath, chest pain, abdominal pain, severe nausea/vomiting, or problems with urination or bowel movements unless otherwise stated above. Pertinent History Reviewed:  Reviewed past medical,surgical, social, obstetrical and family history.  Reviewed problem list, medications and allergies. Physical Assessment:   Vitals:   06/15/22 1137  BP: 109/70  Pulse: 80  Weight: 135 lb (61.2 kg)  Body mass index is 22.47 kg/m.        Physical Examination:   General appearance: Well appearing, and in no distress  Mental status: Alert, oriented to person, place, and time  Skin: Warm & dry  Cardiovascular: Normal heart rate noted  Respiratory: Normal respiratory effort, no distress  Abdomen: Soft, gravid, nontender  Pelvic: Cervical exam deferred         Extremities: Edema: None  Fetal Status: Fetal Heart Rate (bpm): 136 Fundal Height: 32 cm Movement: Present    No results found for this or any previous visit (from the past 24 hour(s)).  Assessment & Plan:  1) Low-risk pregnancy G3P2002 at [redacted]w[redacted]d with an Estimated Date of Delivery: 07/28/22   2) [facility], rx single dose Diflucan 150mg     Meds:  Meds ordered this encounter  Medications   fluconazole (DIFLUCAN) 150 MG tablet     Sig: Take 1 tablet (150 mg total) by mouth once for 1 dose.    Dispense:  1 tablet    Refill:  0    Order Specific Question:   Supervising Provider    Answer:   Janyth Pupa [[id number]]   Labs/procedures today: none  Plan:  Continue routine obstetrical care   Reviewed: Preterm labor symptoms and general obstetric precautions including but not limited to vaginal bleeding, contractions, leaking of fluid and fetal movement were reviewed in detail with the patient.  All questions were answered. Has home bp cuff. Check bp weekly, let us know if >140/90.   Follow-up: Return in 15 days (on 06/30/2022) for LROB, in person; GBS/cultures.  No orders of the defined types were placed in this encounter.  Myrtis Ser CNM 06/15/2022 11:52 AM

## 2022-06-15 NOTE — Patient Instructions (Signed)
Katelyn Martinez, thank you for choosing our office today! We appreciate the opportunity to meet your healthcare needs. You may receive a short survey by mail, e-mail, or through [employer]. If you are happy with your care we would appreciate if you could take just a few minutes to complete the survey questions. We read all of your comments and take your feedback very seriously. Thank you again for choosing our office.  Center for [employer] Team at [place] at [facility] ([place], [place] [address]) Entrance C, located off of [address] parking   CLASSES: Go to [email or web address] to register for classes (childbirth, breastfeeding, waterbirth, infant CPR, daddy bootcamp, etc.)  Call the office [phone]) or go to [facility] if: You begin to have strong, frequent contractions Your water breaks.  Sometimes it is a big gush of fluid, sometimes it is just a trickle that keeps getting your panties wet or running down your legs You have vaginal bleeding.  It is normal to have a small amount of spotting if your cervix was checked.  You don't feel your baby moving like normal.  If you don't, get you something to eat and drink and lay down and focus on feeling your baby move.   If your baby is still not moving like normal, you should call the office or go to [facility]  Call the office [phone]) or go to [facility] hospital for these signs of pre-eclampsia: Severe headache that does not go away with Tylenol Visual changes- seeing spots, double, blurred vision Pain under your right breast or upper abdomen that does not go away with Tums or heartburn medicine Nausea and/or vomiting Severe swelling in your hands, feet, and face   Tdap Vaccine It is recommended that you get the Tdap vaccine during the third trimester of EACH pregnancy to help protect your baby from getting pertussis (whooping cough) 27-36 weeks is the BEST time to do  this so that you can pass the protection on to your baby. During pregnancy is better than after pregnancy, but if you are unable to get it during pregnancy it will be offered at the hospital.  You can get this vaccine with [place], at the health department, your family doctor, or some local pharmacies Everyone who will be around your baby should also be up-to-date on their vaccines before the baby comes. Adults (who are not pregnant) only need 1 dose of Tdap during adulthood.   [facility] Pediatricians/Family Doctors [facility]): [address] [place], [facility]: [address] [place], [phone]                [facility]): [place], [phone] (call to ask if accepting patients) [facility] Department: [address], [place], [facility] [facility] Pediatricians/Family Doctors [facility]): [place]. [place], [address], [address] [facility]: [address] [place], [place] [facility] [place]: [place], [place] [facility]): [phone] [facility]: [address] [place]: [address], [place] [facility]: [phone], [phone]  Home Blood Pressure Monitoring for Patients   Your provider has recommended that you check your  blood pressure (BP) at least once a week at home. If you do not have a blood pressure cuff at home, one will be provided for you. Contact your provider if you have not received your monitor within 1 week.   Helpful Tips for Accurate Home Blood Pressure Checks  Don't smoke, exercise, or drink caffeine 30 minutes before checking your BP Use the restroom before checking your BP (a full bladder can raise your  pressure) Relax in a comfortable upright chair Feet on the ground Left arm resting comfortably on a flat surface at the level of your heart Legs uncrossed Back supported Sit quietly and don't talk Place the cuff on your bare arm Adjust snuggly, so that only two fingertips can fit between your skin and the top of the cuff Check 2 readings separated by at least one minute Keep a log of your BP readings For a visual, please reference this diagram: http://ccnc.care/bpdiagram  Provider Name: [facility]     Phone: [phone]  Zone 1: ALL CLEAR  Continue to monitor your symptoms:  BP reading is less than 140 (top number) or less than 90 (bottom number)  No right upper stomach pain No headaches or seeing spots No feeling nauseated or throwing up No swelling in face and hands  Zone 2: CAUTION Call your doctor's office for any of the following:  BP reading is greater than 140 (top number) or greater than 90 (bottom number)  Stomach pain under your ribs in the middle or right side Headaches or seeing spots Feeling nauseated or throwing up Swelling in face and hands  Zone 3: EMERGENCY  Seek immediate medical care if you have any of the following:  BP reading is greater than160 (top number) or greater than 110 (bottom number) Severe headaches not improving with Tylenol Serious difficulty catching your breath Any worsening symptoms from Zone 2   Third Trimester of Pregnancy The third trimester is from week 29 through week 42, months 7 through 9. The third trimester is a time when the fetus is growing rapidly. At the end of the ninth month, the fetus is about 20 inches in length and weighs 6-10 pounds.  BODY CHANGES Your body goes through many changes during pregnancy. The changes vary from woman to woman.  Your weight will continue to increase. You can expect to gain 25-35 pounds (11-16 kg) by the end of the pregnancy. You may begin to get stretch marks on your hips, abdomen,  and breasts. You may urinate more often because the fetus is moving lower into your pelvis and pressing on your bladder. You may develop or continue to have heartburn as a result of your pregnancy. You may develop constipation because certain hormones are causing the muscles that push waste through your intestines to slow down. You may develop hemorrhoids or swollen, bulging veins (varicose veins). You may have pelvic pain because of the weight gain and pregnancy hormones relaxing your joints between the bones in your pelvis. Backaches may result from overexertion of the muscles supporting your posture. You may have changes in your hair. These can include thickening of your hair, rapid growth, and changes in texture. Some women also have hair loss during or after pregnancy, or hair that feels dry or thin. Your hair will most likely return to normal after your baby is born. Your breasts will continue to grow and be tender. A yellow discharge may leak from your breasts called colostrum. Your belly button may stick out. You may   feel short of breath because of your expanding uterus. You may notice the fetus "dropping," or moving lower in your abdomen. You may have a bloody mucus discharge. This usually occurs a few days to a week before labor begins. Your cervix becomes thin and soft (effaced) near your due date. WHAT TO EXPECT AT YOUR PRENATAL EXAMS  You will have prenatal exams every 2 weeks until week 36. Then, you will have weekly prenatal exams. During a routine prenatal visit: You will be weighed to make sure you and the fetus are growing normally. Your blood pressure is taken. Your abdomen will be measured to track your baby's growth. The fetal heartbeat will be listened to. Any test results from the previous visit will be discussed. You may have a cervical check near your due date to see if you have effaced. At around 36 weeks, your caregiver will check your cervix. At the same time, your  caregiver will also perform a test on the secretions of the vaginal tissue. This test is to determine if a type of bacteria, Group B streptococcus, is present. Your caregiver will explain this further. Your caregiver may ask you: What your birth plan is. How you are feeling. If you are feeling the baby move. If you have had any abnormal symptoms, such as leaking fluid, bleeding, severe headaches, or abdominal cramping. If you have any questions. Other tests or screenings that may be performed during your third trimester include: Blood tests that check for low iron levels (anemia). Fetal testing to check the health, activity level, and growth of the fetus. Testing is done if you have certain medical conditions or if there are problems during the pregnancy. FALSE LABOR You may feel small, irregular contractions that eventually go away. These are called Braxton Hicks contractions, or false labor. Contractions may last for hours, days, or even weeks before true labor sets in. If contractions come at regular intervals, intensify, or become painful, it is best to be seen by your caregiver.  SIGNS OF LABOR  Menstrual-like cramps. Contractions that are 5 minutes apart or less. Contractions that start on the top of the uterus and spread down to the lower abdomen and back. A sense of increased pelvic pressure or back pain. A watery or bloody mucus discharge that comes from the vagina. If you have any of these signs before the 37th week of pregnancy, call your caregiver right away. You need to go to the hospital to get checked immediately. HOME CARE INSTRUCTIONS  Avoid all smoking, herbs, alcohol, and unprescribed drugs. These chemicals affect the formation and growth of the baby. Follow your caregiver's instructions regarding medicine use. There are medicines that are either safe or unsafe to take during pregnancy. Exercise only as directed by your caregiver. Experiencing uterine cramps is a good sign to  stop exercising. Continue to eat regular, healthy meals. Wear a good support bra for breast tenderness. Do not use hot tubs, steam rooms, or saunas. Wear your seat belt at all times when driving. Avoid raw meat, uncooked cheese, cat litter boxes, and soil used by cats. These carry germs that can cause birth defects in the baby. Take your prenatal vitamins. Try taking a stool softener (if your caregiver approves) if you develop constipation. Eat more high-fiber foods, such as fresh vegetables or fruit and whole grains. Drink plenty of fluids to keep your urine clear or pale yellow. Take warm sitz baths to soothe any pain or discomfort caused by hemorrhoids. Use hemorrhoid cream if   your caregiver approves. If you develop varicose veins, wear support hose. Elevate your feet for 15 minutes, 3-4 times a day. Limit salt in your diet. Avoid heavy lifting, wear low heal shoes, and practice good posture. Rest a lot with your legs elevated if you have leg cramps or low back pain. Visit your dentist if you have not gone during your pregnancy. Use a soft toothbrush to brush your teeth and be gentle when you floss. A sexual relationship may be continued unless your caregiver directs you otherwise. Do not travel far distances unless it is absolutely necessary and only with the approval of your caregiver. Take prenatal classes to understand, practice, and ask questions about the labor and delivery. Make a trial run to the hospital. Pack your hospital bag. Prepare the baby's nursery. Continue to go to all your prenatal visits as directed by your caregiver. SEEK MEDICAL CARE IF: You are unsure if you are in labor or if your water has broken. You have dizziness. You have mild pelvic cramps, pelvic pressure, or nagging pain in your abdominal area. You have persistent nausea, vomiting, or diarrhea. You have a bad smelling vaginal discharge. You have pain with urination. SEEK IMMEDIATE MEDICAL CARE IF:  You  have a fever. You are leaking fluid from your vagina. You have spotting or bleeding from your vagina. You have severe abdominal cramping or pain. You have rapid weight loss or gain. You have shortness of breath with chest pain. You notice sudden or extreme swelling of your face, hands, ankles, feet, or legs. You have not felt your baby move in over an hour. You have severe headaches that do not go away with medicine. You have vision changes. Document Released: 08/30/2001 Document Revised: 09/10/2013 Document Reviewed: 11/06/2012 [facility] Patient Information 2015 [place], [place]. This information is not intended to replace advice given to you by your health care provider. Make sure you discuss any questions you have with your health care provider.

## 2022-06-29 ENCOUNTER — Ambulatory Visit (INDEPENDENT_AMBULATORY_CARE_PROVIDER_SITE_OTHER): Payer: Medicaid Other | Admitting: Advanced Practice Midwife

## 2022-06-29 ENCOUNTER — Other Ambulatory Visit (HOSPITAL_COMMUNITY)
Admission: RE | Admit: 2022-06-29 | Discharge: 2022-06-29 | Disposition: A | Discharge status: Home / Self Care | Payer: Medicaid Other | Source: Ambulatory Visit | Attending: Advanced Practice Midwife | Admitting: Advanced Practice Midwife

## 2022-06-29 VITALS — BP 121/75 | HR 85 | Wt 135.0 lb

## 2022-06-29 DIAGNOSIS — Z23 Encounter for immunization: Secondary | ICD-10-CM | POA: Diagnosis not present

## 2022-06-29 DIAGNOSIS — Z3A35 35 weeks gestation of pregnancy: Secondary | ICD-10-CM | POA: Diagnosis not present

## 2022-06-29 DIAGNOSIS — Z3483 Encounter for supervision of other normal pregnancy, third trimester: Secondary | ICD-10-CM

## 2022-06-29 DIAGNOSIS — Z3480 Encounter for supervision of other normal pregnancy, unspecified trimester: Secondary | ICD-10-CM

## 2022-06-29 NOTE — Progress Notes (Signed)
   LOW-RISK PREGNANCY VISIT Patient name: Katelyn Martinez MRN [id number]  Date of birth: [birth year] Chief Complaint:   Routine Prenatal Visit  History of Present Illness:   Katelyn Martinez is a 26 y.o. G58P2002 female at [redacted]w[redacted]d with an Estimated Date of Delivery: 07/28/22 being seen today for ongoing management of a low-risk pregnancy.  Today she reports no complaints. Contractions: Irritability. Vag. Bleeding: None.  Movement: Present. denies leaking of fluid. Review of Systems:   Pertinent items are noted in HPI Denies abnormal vaginal discharge w/ itching/odor/irritation, headaches, visual changes, shortness of breath, chest pain, abdominal pain, severe nausea/vomiting, or problems with urination or bowel movements unless otherwise stated above. Pertinent History Reviewed:  Reviewed past medical,surgical, social, obstetrical and family history.  Reviewed problem list, medications and allergies. Physical Assessment:   Vitals:   06/29/22 0949  BP: 121/75  Pulse: 85  Weight: 135 lb (61.2 kg)  Body mass index is 22.47 kg/m.        Physical Examination:   General appearance: Well appearing, and in no distress  Mental status: Alert, oriented to person, place, and time  Skin: Warm & dry  Cardiovascular: Normal heart rate noted  Respiratory: Normal respiratory effort, no distress  Abdomen: Soft, gravid, nontender  Pelvic: Cervical exam performed  Dilation: Closed Effacement (%): 50 Station: -2  Extremities: Edema: None  Fetal Status: Fetal Heart Rate (bpm): 129 Fundal Height: 33 cm Movement: Present Presentation: Vertex  No results found for this or any previous visit (from the past 24 hour(s)).  Assessment & Plan:  1) Low-risk pregnancy G3P2002 at [redacted]w[redacted]d with an Estimated Date of Delivery: 07/28/22   2) Watch FH, had 1cm gain in 2wks, but vertex palpates lower in pelvis than last visit; can get growth u/s if needed based on FH next week   Meds: No orders of the defined types were  placed in this encounter.  Labs/procedures today: SVE; GBS/cultures; flu vax  Plan:  Continue routine obstetrical care   Reviewed: Preterm labor symptoms and general obstetric precautions including but not limited to vaginal bleeding, contractions, leaking of fluid and fetal movement were reviewed in detail with the patient.  All questions were answered. Has home bp cuff. Check bp weekly, let us know if >140/90.   Follow-up: Return for set up weekly LROB x 4.  Orders Placed This Encounter  Procedures   Culture, beta strep (group b only)   Flu Vaccine QUAD 63mo+IM (Fluarix, Fluzone & Alfiuria Quad PF)   Myrtis Ser CNM 06/29/2022 10:17 AM

## 2022-06-30 LAB — CERVICOVAGINAL ANCILLARY ONLY
Bacterial Vaginitis (gardnerella): NEGATIVE
Candida Glabrata: NEGATIVE
Candida Vaginitis: NEGATIVE
Chlamydia: NEGATIVE
Comment: NEGATIVE
Comment: NEGATIVE
Comment: NEGATIVE
Comment: NEGATIVE
Comment: NEGATIVE
Comment: NORMAL
Neisseria Gonorrhea: NEGATIVE
Trichomonas: NEGATIVE

## 2022-07-03 LAB — CULTURE, BETA STREP (GROUP B ONLY): Strep Gp B Culture: NEGATIVE

## 2022-07-08 ENCOUNTER — Encounter: Payer: Self-pay | Admitting: Obstetrics & Gynecology

## 2022-07-08 ENCOUNTER — Inpatient Hospital Stay (HOSPITAL_COMMUNITY)
Admission: AD | Admit: 2022-07-08 | Discharge: 2022-07-09 | Disposition: A | Discharge status: Home / Self Care | Payer: Medicaid Other | Source: Home / Self Care | Attending: Obstetrics & Gynecology | Admitting: Obstetrics & Gynecology

## 2022-07-08 ENCOUNTER — Ambulatory Visit (INDEPENDENT_AMBULATORY_CARE_PROVIDER_SITE_OTHER): Payer: Medicaid Other | Admitting: Obstetrics & Gynecology

## 2022-07-08 VITALS — BP 131/76 | HR 83 | Wt 139.0 lb

## 2022-07-08 DIAGNOSIS — O471 False labor at or after 37 completed weeks of gestation: Secondary | ICD-10-CM | POA: Insufficient documentation

## 2022-07-08 DIAGNOSIS — Z8679 Personal history of other diseases of the circulatory system: Secondary | ICD-10-CM

## 2022-07-08 DIAGNOSIS — Z8632 Personal history of gestational diabetes: Secondary | ICD-10-CM

## 2022-07-08 DIAGNOSIS — Z3483 Encounter for supervision of other normal pregnancy, third trimester: Secondary | ICD-10-CM

## 2022-07-08 DIAGNOSIS — Z3A37 37 weeks gestation of pregnancy: Secondary | ICD-10-CM | POA: Insufficient documentation

## 2022-07-08 DIAGNOSIS — O09299 Supervision of pregnancy with other poor reproductive or obstetric history, unspecified trimester: Secondary | ICD-10-CM

## 2022-07-08 DIAGNOSIS — Z3480 Encounter for supervision of other normal pregnancy, unspecified trimester: Secondary | ICD-10-CM

## 2022-07-08 DIAGNOSIS — O479 False labor, unspecified: Secondary | ICD-10-CM

## 2022-07-08 NOTE — Progress Notes (Signed)
LOW-RISK PREGNANCY VISIT Patient name: Katelyn Martinez MRN [id number]  Date of birth: 1996-06-24 Chief Complaint:   Routine Prenatal Visit  History of Present Illness:   Katelyn Martinez is a 26 y.o. G72P2002 female at [redacted]w[redacted]d with an Estimated Date of Delivery: 07/28/22 being seen today for ongoing management of a low-risk pregnancy.     04/20/2022   10:07 AM 01/17/2022   10:43 AM 05/12/2021    9:29 AM 01/27/2021    9:13 AM 08/03/2020   12:03 PM  Depression screen PHQ 2/9  Decreased Interest 0 0 0 2 1  Down, Depressed, Hopeless 0 0 0 2 1  PHQ - 2 Score 0 0 0 4 2  Altered sleeping 2 1 1 2 1   Tired, decreased energy 1 2 1 2 1   Change in appetite 0 1 0 2 1  Feeling bad or failure about yourself  0 0 0 2 1  Trouble concentrating 0 0 0 2 1  Moving slowly or fidgety/restless 0 0 0 2 1  Suicidal thoughts 0 0 0 2 0  PHQ-9 Score 3 4 2 18 8     Today she reports no complaints. Contractions: Irritability.  .  Movement: Present. denies leaking of fluid. Review of Systems:   Pertinent items are noted in HPI Denies abnormal vaginal discharge w/ itching/odor/irritation, headaches, visual changes, shortness of breath, chest pain, abdominal pain, severe nausea/vomiting, or problems with urination or bowel movements unless otherwise stated above. Pertinent History Reviewed:  Reviewed past medical,surgical, social, obstetrical and family history.  Reviewed problem list, medications and allergies. Physical Assessment:   Vitals:   07/08/22 0938  BP: 131/76  Pulse: 83  Weight: 139 lb (63 kg)  Body mass index is 23.13 kg/m.        Physical Examination:   General appearance: Well appearing, and in no distress  Mental status: Alert, oriented to person, place, and time  Skin: Warm & dry  Cardiovascular: Normal heart rate noted  Respiratory: Normal respiratory effort, no distress  Abdomen: Soft, gravid, nontender  Pelvic: Cervical exam performed  Dilation: 2 Effacement (%): Thick Station:  -2  Extremities: Edema: None  Fetal Status: Fetal Heart Rate (bpm): 135 Fundal Height: 36 cm Movement: Present Presentation: Vertex  Chaperone: Amanda Rash    No results found for this or any previous visit (from the past 24 hour(s)).  Assessment & Plan:  1) Low-risk pregnancy G3P2002 at [redacted]w[redacted]d with an Estimated Date of Delivery: 07/28/22     ICD-10-CM   1. Encounter for supervision of other normal pregnancy in third trimester  Z34.83     2. History of intracranial hemorrhage, 2016, due to fall(at [place])  Z86.79     3. History of gestational diabetes in prior pregnancy, currently pregnant  O09.299    Z86.32     4. [redacted] weeks gestation of pregnancy  Z3A.37         Meds: No orders of the defined types were placed in this encounter.  Labs/procedures today: none  Plan:  Continue routine obstetrical care  Next visit: prefers in person    Reviewed: Term labor symptoms and general obstetric precautions including but not limited to vaginal bleeding, contractions, leaking of fluid and fetal movement were reviewed in detail with the patient.  All questions were answered. Has home bp cuff.  Check bp weekly, let us know if >140/90.   Follow-up: Return in about 1 week (around 07/15/2022) for [place].  No orders of the defined types  were placed in this encounter.   Lazaro Arms, MD 07/08/2022 10:29 AM

## 2022-07-08 NOTE — MAU Note (Signed)
Pt says UC's strong since 4 pm [facility]- [facility]- this am 2 cm  Denies HSV GBS- neg

## 2022-07-09 ENCOUNTER — Inpatient Hospital Stay (HOSPITAL_COMMUNITY): Payer: Medicaid Other | Admitting: Anesthesiology

## 2022-07-09 ENCOUNTER — Encounter (HOSPITAL_COMMUNITY): Payer: Self-pay | Admitting: Family Medicine

## 2022-07-09 ENCOUNTER — Other Ambulatory Visit: Payer: Self-pay

## 2022-07-09 ENCOUNTER — Inpatient Hospital Stay (HOSPITAL_COMMUNITY)
Admission: AD | Admit: 2022-07-09 | Discharge: 2022-07-11 | DRG: 807 | Disposition: A | Discharge status: Home / Self Care | Payer: Medicaid Other | Attending: Family Medicine | Admitting: Family Medicine

## 2022-07-09 DIAGNOSIS — R58 Hemorrhage, not elsewhere classified: Secondary | ICD-10-CM | POA: Diagnosis not present

## 2022-07-09 DIAGNOSIS — Z30430 Encounter for insertion of intrauterine contraceptive device: Secondary | ICD-10-CM | POA: Diagnosis not present

## 2022-07-09 DIAGNOSIS — F419 Anxiety disorder, unspecified: Secondary | ICD-10-CM | POA: Diagnosis not present

## 2022-07-09 DIAGNOSIS — Z3490 Encounter for supervision of normal pregnancy, unspecified, unspecified trimester: Secondary | ICD-10-CM

## 2022-07-09 DIAGNOSIS — Z3A37 37 weeks gestation of pregnancy: Secondary | ICD-10-CM | POA: Diagnosis not present

## 2022-07-09 DIAGNOSIS — F32A Depression, unspecified: Secondary | ICD-10-CM | POA: Diagnosis present

## 2022-07-09 DIAGNOSIS — O09899 Supervision of other high risk pregnancies, unspecified trimester: Secondary | ICD-10-CM

## 2022-07-09 DIAGNOSIS — T68XXXA Hypothermia, initial encounter: Secondary | ICD-10-CM | POA: Diagnosis not present

## 2022-07-09 DIAGNOSIS — R0689 Other abnormalities of breathing: Secondary | ICD-10-CM | POA: Diagnosis not present

## 2022-07-09 DIAGNOSIS — O99344 Other mental disorders complicating childbirth: Secondary | ICD-10-CM | POA: Diagnosis not present

## 2022-07-09 DIAGNOSIS — F418 Other specified anxiety disorders: Secondary | ICD-10-CM | POA: Diagnosis not present

## 2022-07-09 DIAGNOSIS — O479 False labor, unspecified: Secondary | ICD-10-CM | POA: Diagnosis not present

## 2022-07-09 DIAGNOSIS — Z3480 Encounter for supervision of other normal pregnancy, unspecified trimester: Secondary | ICD-10-CM | POA: Diagnosis not present

## 2022-07-09 DIAGNOSIS — Z87891 Personal history of nicotine dependence: Secondary | ICD-10-CM

## 2022-07-09 DIAGNOSIS — R52 Pain, unspecified: Secondary | ICD-10-CM | POA: Diagnosis not present

## 2022-07-09 DIAGNOSIS — Z975 Presence of (intrauterine) contraceptive device: Secondary | ICD-10-CM

## 2022-07-09 LAB — TYPE AND SCREEN
ABO/RH(D): O POS
Antibody Screen: NEGATIVE

## 2022-07-09 LAB — RPR: RPR Ser Ql: NONREACTIVE

## 2022-07-09 LAB — CBC
HCT: 33.4 % — ABNORMAL LOW (ref 36.0–46.0)
Hemoglobin: 10.3 g/dL — ABNORMAL LOW (ref 12.0–15.0)
MCH: 24.2 pg — ABNORMAL LOW (ref 26.0–34.0)
MCHC: 30.8 g/dL (ref 30.0–36.0)
MCV: 78.4 fL — ABNORMAL LOW (ref 80.0–100.0)
Platelets: 213 10*3/uL (ref 150–400)
RBC: 4.26 MIL/uL (ref 3.87–5.11)
RDW: 13.3 % (ref 11.5–15.5)
WBC: 11.6 10*3/uL — ABNORMAL HIGH (ref 4.0–10.5)
nRBC: 0 % (ref 0.0–0.2)

## 2022-07-09 MED ORDER — TERBUTALINE SULFATE 1 MG/ML IJ SOLN: 0.2500 mg | Freq: Once | INTRAMUSCULAR | Status: DC | PRN

## 2022-07-09 MED ORDER — IBUPROFEN 600 MG PO TABS
600.0000 mg | ORAL_TABLET | Freq: Four times a day (QID) | ORAL | Status: DC
  Administered 2022-07-09 – 2022-07-11 (×7): 600 mg via ORAL
  Filled 2022-07-09 (×7): qty 1

## 2022-07-09 MED ORDER — LIDOCAINE HCL (PF) 1 % IJ SOLN: 30.0000 mL | INTRAMUSCULAR | Status: DC | PRN

## 2022-07-09 MED ORDER — LACTATED RINGERS IV SOLN: INTRAVENOUS | Status: DC

## 2022-07-09 MED ORDER — SENNOSIDES-DOCUSATE SODIUM 8.6-50 MG PO TABS
2.0000 | ORAL_TABLET | ORAL | Status: DC
  Administered 2022-07-09: 2 via ORAL
  Filled 2022-07-09: qty 2

## 2022-07-09 MED ORDER — EPHEDRINE 5 MG/ML INJ: 10.0000 mg | INTRAVENOUS | Status: DC | PRN

## 2022-07-09 MED ORDER — SODIUM CHLORIDE 0.9% FLUSH: 3.0000 mL | INTRAVENOUS | Status: DC | PRN

## 2022-07-09 MED ORDER — OXYTOCIN BOLUS FROM INFUSION
333.0000 mL | Freq: Once | INTRAVENOUS | Status: AC
  Administered 2022-07-09: 333 mL via INTRAVENOUS

## 2022-07-09 MED ORDER — OXYTOCIN-SODIUM CHLORIDE 30-0.9 UT/500ML-% IV SOLN: 1.0000 m[IU]/min | INTRAVENOUS | Status: DC

## 2022-07-09 MED ORDER — LEVONORGESTREL 20 MCG/DAY IU IUD
1.0000 | INTRAUTERINE_SYSTEM | Freq: Once | INTRAUTERINE | Status: AC
  Administered 2022-07-09: 1 via INTRAUTERINE
  Filled 2022-07-09: qty 1

## 2022-07-09 MED ORDER — FENTANYL-BUPIVACAINE-NACL 0.5-0.125-0.9 MG/250ML-% EP SOLN
EPIDURAL | Status: DC | PRN
  Administered 2022-07-09: 12 mL/h via EPIDURAL

## 2022-07-09 MED ORDER — BENZOCAINE-MENTHOL 20-0.5 % EX AERO
1.0000 | INHALATION_SPRAY | CUTANEOUS | Status: DC | PRN
  Administered 2022-07-09: 1 via TOPICAL
  Filled 2022-07-09: qty 56

## 2022-07-09 MED ORDER — WITCH HAZEL-GLYCERIN EX PADS: 1.0000 | MEDICATED_PAD | CUTANEOUS | Status: DC | PRN

## 2022-07-09 MED ORDER — ONDANSETRON HCL 4 MG PO TABS: 4.0000 mg | ORAL_TABLET | ORAL | Status: DC | PRN

## 2022-07-09 MED ORDER — SODIUM CHLORIDE 0.9 % IV SOLN: 250.0000 mL | INTRAVENOUS | Status: DC | PRN

## 2022-07-09 MED ORDER — OXYCODONE-ACETAMINOPHEN 5-325 MG PO TABS: 2.0000 | ORAL_TABLET | ORAL | Status: DC | PRN

## 2022-07-09 MED ORDER — OXYCODONE-ACETAMINOPHEN 5-325 MG PO TABS: 1.0000 | ORAL_TABLET | ORAL | Status: DC | PRN

## 2022-07-09 MED ORDER — ZOLPIDEM TARTRATE 5 MG PO TABS: 5.0000 mg | ORAL_TABLET | Freq: Every evening | ORAL | Status: DC | PRN

## 2022-07-09 MED ORDER — DIPHENHYDRAMINE HCL 25 MG PO CAPS: 25.0000 mg | ORAL_CAPSULE | Freq: Four times a day (QID) | ORAL | Status: DC | PRN

## 2022-07-09 MED ORDER — SODIUM CHLORIDE 0.9% FLUSH: 3.0000 mL | Freq: Two times a day (BID) | INTRAVENOUS | Status: DC

## 2022-07-09 MED ORDER — FENTANYL-BUPIVACAINE-NACL 0.5-0.125-0.9 MG/250ML-% EP SOLN
12.0000 mL/h | EPIDURAL | Status: DC | PRN
  Filled 2022-07-09: qty 250

## 2022-07-09 MED ORDER — PHENYLEPHRINE 80 MCG/ML (10ML) SYRINGE FOR IV PUSH (FOR BLOOD PRESSURE SUPPORT): 80.0000 ug | PREFILLED_SYRINGE | INTRAVENOUS | Status: DC | PRN

## 2022-07-09 MED ORDER — OXYTOCIN-SODIUM CHLORIDE 30-0.9 UT/500ML-% IV SOLN
2.5000 [IU]/h | INTRAVENOUS | Status: DC
  Administered 2022-07-09: 2.5 [IU]/h via INTRAVENOUS
  Filled 2022-07-09: qty 500

## 2022-07-09 MED ORDER — SOD CITRATE-CITRIC ACID 500-334 MG/5ML PO SOLN: 30.0000 mL | ORAL | Status: DC | PRN

## 2022-07-09 MED ORDER — PRENATAL MULTIVITAMIN CH
1.0000 | ORAL_TABLET | Freq: Every day | ORAL | Status: DC
  Administered 2022-07-10: 1 via ORAL
  Filled 2022-07-09: qty 1

## 2022-07-09 MED ORDER — ONDANSETRON HCL 4 MG/2ML IJ SOLN
4.0000 mg | Freq: Four times a day (QID) | INTRAMUSCULAR | Status: DC | PRN
  Administered 2022-07-09: 4 mg via INTRAVENOUS
  Filled 2022-07-09: qty 2

## 2022-07-09 MED ORDER — ERYTHROMYCIN 5 MG/GM OP OINT
TOPICAL_OINTMENT | OPHTHALMIC | Status: AC
  Filled 2022-07-09: qty 1

## 2022-07-09 MED ORDER — LIDOCAINE HCL (PF) 1 % IJ SOLN
INTRAMUSCULAR | Status: DC | PRN
  Administered 2022-07-09: 2 mL via EPIDURAL
  Administered 2022-07-09: 10 mL via EPIDURAL

## 2022-07-09 MED ORDER — COCONUT OIL OIL: 1.0000 | TOPICAL_OIL | Status: DC | PRN

## 2022-07-09 MED ORDER — ONDANSETRON HCL 4 MG/2ML IJ SOLN: 4.0000 mg | INTRAMUSCULAR | Status: DC | PRN

## 2022-07-09 MED ORDER — DIPHENHYDRAMINE HCL 50 MG/ML IJ SOLN: 12.5000 mg | INTRAMUSCULAR | Status: DC | PRN

## 2022-07-09 MED ORDER — DIBUCAINE (PERIANAL) 1 % EX OINT: 1.0000 | TOPICAL_OINTMENT | CUTANEOUS | Status: DC | PRN

## 2022-07-09 MED ORDER — ACETAMINOPHEN 325 MG PO TABS: 650.0000 mg | ORAL_TABLET | ORAL | Status: DC | PRN

## 2022-07-09 MED ORDER — SIMETHICONE 80 MG PO CHEW: 80.0000 mg | CHEWABLE_TABLET | ORAL | Status: DC | PRN

## 2022-07-09 MED ORDER — LACTATED RINGERS IV SOLN: 500.0000 mL | INTRAVENOUS | Status: DC | PRN

## 2022-07-09 MED ORDER — LACTATED RINGERS IV SOLN
500.0000 mL | Freq: Once | INTRAVENOUS | Status: AC
  Administered 2022-07-09: 500 mL via INTRAVENOUS

## 2022-07-09 NOTE — Anesthesia Preprocedure Evaluation (Signed)
Anesthesia Evaluation  Patient identified by MRN, date of birth, ID band Patient awake    Reviewed: Allergy & Precautions, Patient's Chart, lab work & pertinent test results  Airway Mallampati: II  TM Distance: >3 FB Neck ROM: Full    Dental no notable dental hx.    Pulmonary asthma , former smoker,    Pulmonary exam normal breath sounds clear to auscultation       Cardiovascular negative cardio ROS Normal cardiovascular exam Rhythm:Regular Rate:Normal     Neuro/Psych  Headaches, PSYCHIATRIC DISORDERS Anxiety Depression    GI/Hepatic negative GI ROS, Neg liver ROS,   Endo/Other  negative endocrine ROS  Renal/GU negative Renal ROS  negative genitourinary   Musculoskeletal negative musculoskeletal ROS (+)   Abdominal   Peds negative pediatric ROS (+)  Hematology negative hematology ROS (+)   Anesthesia Other Findings   Reproductive/Obstetrics (+) Pregnancy                             Anesthesia Physical Anesthesia Plan  ASA: 2  Anesthesia Plan: Epidural   Post-op Pain Management:    Induction:   PONV Risk Score and Plan: 2  Airway Management Planned: Natural Airway  Additional Equipment: None  Intra-op Plan:   Post-operative Plan:   Informed Consent: I have reviewed the patients History and Physical, chart, labs and discussed the procedure including the risks, benefits and alternatives for the proposed anesthesia with the patient or authorized representative who has indicated his/her understanding and acceptance.       Plan Discussed with:   Anesthesia Plan Comments:         Anesthesia Quick Evaluation

## 2022-07-09 NOTE — Progress Notes (Signed)
S: Ms. Katelyn Martinez is a 26 y.o. [id number] at [redacted]w[redacted]d  who presents to MAU today complaining since 4pm. She reports normal fetal movement.    O: BP 118/76 (BP Location: Right Arm)   Pulse 80   Temp 97.9 F (36.6 C) (Oral)   Resp 18   Ht 5\' 5"  (1.651 m)   Wt 63 kg   LMP  (LMP Unknown)   BMI 23.11 kg/m   Cervical exam:   Reported per nursing 2/thick/ballotable  Fetal Monitoring: Baseline: 125bpm Variability: Moderate Accelerations: Present Decelerations: None   A: SIUP at [redacted]w[redacted]d  False labor- patient prefers to be discharged  P: Labor precautions given   Gerlene Fee, DO 07/09/2022 2:01 AM

## 2022-07-09 NOTE — MAU Note (Signed)
Katelyn Martinez is a 26 y.o. at [redacted]w[redacted]d here in MAU reporting: Pt came by EMS for vaginal bleeding and ctx. Pt states she felt a gush at 0400 and went to the toilet and saw blood and one large clot. Pt states her ctx are 4-5 minutes apart. Pt rates her ctx a 7/10. +FM.   Onset of complaint: 0400 07/09/2022 Pain score: 7/10 abdomen Vitals:   07/09/22 0516  BP: 127/85  Pulse: 66  Resp: 16  Temp: 98 F (36.7 C)  SpO2: 97%     FHT:124 Lab orders placed from triage:

## 2022-07-09 NOTE — Discharge Summary (Signed)
Postpartum Discharge Summary  Date of Service updated***     Patient Name: Katelyn Martinez DOB: 06-Feb-1996 MRN: [id number]  Date of admission: 07/09/2022 Delivery date:07/09/2022  Delivering provider: Shelda Pal  Date of discharge: 07/09/2022  Admitting diagnosis: Spontaneous onset of labor after 76 but before 35 completed weeks gestation with delivery by planned cesarean section [O75.82] Intrauterine pregnancy: [redacted]w[redacted]d     Secondary diagnosis:  Principal Problem:   Vaginal delivery Active Problems:   Anxiety and depression   Encounter for supervision of normal pregnancy, antepartum   Short interval between pregnancies affecting pregnancy, antepartum   Spontaneous onset of labor after 24 but before 39 completed weeks gestation with delivery by planned cesarean section   Encounter for insertion of mirena IUD   IUD (intrauterine device) in place  Additional problems: ***    Discharge diagnosis: Term Pregnancy Delivered                                              Post partum procedures:{Postpartum procedures:23558} Augmentation: AROM and Cytotec Complications: None  Hospital course: Onset of Labor With Vaginal Delivery      26 y.o. yo D7O2423 at [redacted]w[redacted]d was admitted in Latent Labor on 07/09/2022. Labor course was complicated bynone  Membrane Rupture Time/Date: 11:21 AM ,07/09/2022   Delivery Method:Vaginal, Spontaneous  Episiotomy: None  Lacerations:  None  Patient had a postpartum course complicated by ***.  She is ambulating, tolerating a regular diet, passing flatus, and urinating well. Patient is discharged home in stable condition on 07/09/22.  Newborn Data: Birth date:07/09/2022  Birth time:1:53 PM  Gender:Female  Living status:Living  Apgars:9 ,9  Weight:   Magnesium Sulfate received: {Mag received:30440022} BMZ received: No Rhophylac:N/A MMR:N/A T-DaP:Given prenatally Flu: {NTI:14431} Transfusion:{Transfusion received:30440034}  Physical exam   Vitals:   07/09/22 1355 07/09/22 1356 07/09/22 1400 07/09/22 1417  BP: (!) 130/93 (!) 130/93 134/83 127/86  Pulse: 94 94 90 91  Resp:      Temp:      TempSrc:      SpO2:       General: {Exam; general:21111117} Lochia: {Desc; appropriate/inappropriate:30686::"appropriate"} Uterine Fundus: {Desc; firm/soft:30687} Incision: {Exam; incision:21111123} DVT Evaluation: {Exam; dvt:2111122} Labs: Lab Results  Component Value Date   WBC 11.6 (H) 07/09/2022   HGB 10.3 (L) 07/09/2022   HCT 33.4 (L) 07/09/2022   MCV 78.4 (L) 07/09/2022   PLT 213 07/09/2022      Latest Ref Rng & Units 12/18/2020    1:03 PM  CMP  Glucose 70 - 99 mg/dL 106   BUN 6 - 20 mg/dL 8   Creatinine 0.44 - 1.00 mg/dL 0.70   Sodium 135 - 145 mmol/L 136   Potassium 3.5 - 5.1 mmol/L 3.8   Chloride 98 - 111 mmol/L 103   CO2 22 - 32 mmol/L 24   Calcium 8.9 - 10.3 mg/dL 9.0   Total Protein 6.5 - 8.1 g/dL 7.2   Total Bilirubin 0.3 - 1.2 mg/dL 0.7   Alkaline Phos 38 - 126 U/L 43   AST 15 - 41 U/L 18   ALT 0 - 44 U/L 13    Edinburgh Score:    08/02/2021    5:31 AM  Edinburgh Postnatal Depression Scale Screening Tool  I have been able to laugh and see the funny side of things. 0  I have looked forward with enjoyment to  things. 0  I have blamed myself unnecessarily when things went wrong. 1  I have been anxious or worried for no good reason. 1  I have felt scared or panicky for no good reason. 0  Things have been getting on top of me. 1  I have been so unhappy that I have had difficulty sleeping. 1  I have felt sad or miserable. 1  I have been so unhappy that I have been crying. 0  The thought of harming myself has occurred to me. 0  Edinburgh Postnatal Depression Scale Total 5     After visit meds:  Allergies as of 07/09/2022   No Known Allergies   Med Rec must be completed prior to using this [facility]***        Discharge home in stable condition Infant Feeding: {Baby feeding:23562} Infant  Disposition:{CHL IP OB HOME WITH GJGMLV:99412} Discharge instruction: per After Visit Summary and Postpartum booklet. Activity: Advance as tolerated. Pelvic rest for 6 weeks.  Diet: {OB RKYB:53391792} Future Appointments: Future Appointments  Date Time Provider [place]  07/15/2022 10:30 AM Myrtis Ser, CNM [facility]  07/22/2022 10:30 AM Myrtis Ser, CNM [facility]  07/29/2022 10:30 AM Elonda Husky Mertie Clause, MD [facility]   Follow up Visit: Message sent to [facility] 10/21  Please schedule this patient for a In person postpartum visit in 6 weeks with the following provider: Any provider. Additional Postpartum F/U:Postpartum Depression checkup  Low risk pregnancy complicated by:  none Delivery mode:  Vaginal, Spontaneous  Anticipated Birth Control:  PP IUD placed   07/09/2022 Shelda Pal, DO

## 2022-07-09 NOTE — H&P (Addendum)
OBSTETRIC ADMISSION HISTORY AND PHYSICAL  Katelyn Martinez is a 26 y.o. female G3P2002 with IUP at [redacted]w[redacted]d by 8w [place] presenting for SOL. She does have some vaginal bleeding with contractions. She reports +FMs, No LOF, no blurry vision, headaches or peripheral edema, and RUQ pain.  She plans on bottle feeding. She request postpartum IUD for birth control. 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    Nexplanon insertion 04/03/2018   Inserted right arm 04/03/18   Recurrent upper respiratory infection (URI)    Trauma 12/22/2014   feel at [facility] and had skull Fracture     Past Surgical History: Past Surgical History:  Procedure Laterality Date   BLADDER SURGERY     uteral reimplantation   ORIF TIBIA PLATEAU Right 12/29/2014   Procedure: OPEN REDUCTION INTERNAL FIXATION (ORIF) TIBIAL PLATEAU;  Surgeon: 02/28/2015, MD;  Location: [facility] OR;  Service: Orthopedics;  Laterality: Right;   SALIVARY GLAND SURGERY     TONSILLECTOMY      Obstetrical History: OB History     Gravida  3   Para  2   Term  2   Preterm      AB      Living  2      SAB      IAB      Ectopic      Multiple  0   Live Births  2           Social History Social History   Socioeconomic History   Marital status: Single    Spouse name: Not on file   Number of children: 1   Years of education: Not on file   Highest education level: Not on file  Occupational History   Not on file  Tobacco Use   Smoking status: Former    Types: Cigarettes    Passive exposure: Yes   Smokeless tobacco: Never  Vaping Use   Vaping Use: Never used  Substance and Sexual Activity    Alcohol use: Not Currently    Comment: socially   Drug use: Not Currently    Frequency: 7.0 times per week    Types: Marijuana   Sexual activity: Yes    Birth control/protection: None  Other Topics Concern   Not on file  Social History Narrative   Not on file   Social Determinants of Health   Financial Resource Strain: Low Risk  (04/20/2022)   Overall Financial Resource Strain (CARDIA)    Difficulty of Paying Living Expenses: Not hard at all  Food Insecurity: No Food Insecurity (04/20/2022)   Hunger Vital Sign    Worried About Running Out of Food in the Last Year: Never true    Ran Out of Food in the Last Year: Never true  Transportation Needs: No Transportation Needs (04/20/2022)   PRAPARE - 06/20/2022 (Medical): No    Lack of Transportation (Non-Medical): No  Physical Activity: Inactive (04/20/2022)   Exercise Vital Sign    Days of Exercise per Week: 0 days    Minutes of Exercise per Session: 0 min  Stress: Stress Concern Present (04/20/2022)   [place]    Feeling of Stress : To some extent  Social Connections: Socially Isolated (04/20/2022)   Social Connection and Isolation Panel [NHANES]    Frequency of Communication with Friends and Family: More than three times a week    Frequency of Social Gatherings with Friends and Family: Once a week    Attends Religious Services: Never    Marine scientist or Organizations: No    Attends Music therapist: Never    Marital Status: Never married    Family History: Family History  Problem Relation Age of Onset   Suicidality Paternal Grandfather    Cancer Paternal Grandmother        lung   Cancer Maternal Grandmother    Cancer Maternal Grandfather        bladder   Bipolar disorder Father    Ovarian cysts Mother    Endometriosis Mother    Fibromyalgia Mother    Hepatitis C Mother    Diabetes Mother    Other Sister         mood disorder; patella femoral syndrome   Cancer Maternal Aunt        ovarian   Cervical cancer Maternal Aunt     Allergies: No Known Allergies  Medications Prior to Admission  Medication Sig Dispense Refill Last Dose   Prenatal Vit-Fe Fumarate-FA (PRENATAL VITAMIN PO) Take by mouth.        Review of Systems   All systems reviewed and negative except as stated in HPI  Blood pressure 127/85, pulse 66, temperature 98 F (36.7 C), temperature source Oral, resp. rate 16, SpO2 97 %, not currently breastfeeding. General appearance: alert Lungs: clear to auscultation bilaterally Heart: regular rate and rhythm Abdomen: soft, non-tender; bowel sounds normal Extremities: Homans sign is negative, no sign of DVT Presentation: cephalic Fetal monitoringBaseline 125, moderate variability + accels, - decels  Uterine activity contracting regularly  Dilation: 4 Effacement (%): 70 Exam by:: Milinda Cave CNM   Prenatal labs: ABO, Rh: O/Positive/-- (05/01 1157) Antibody: Negative (08/02 0844) Rubella: 1.91 (05/01 1157) RPR: Non Reactive (08/02 0844)  HBsAg: Negative (05/01 1157)  HIV: Non Reactive (08/02 0844)  GBS: Negative/-- (10/11 1430)  1 hr Glucola 113, 2 hr 26 Genetic screening  low risk female  Anatomy US LVEICF, otherwise nml   Prenatal Transfer Tool  Maternal Diabetes: No Genetic Screening: Normal Maternal Ultrasounds/Referrals: Isolated EIF (echogenic intracardiac focus) Fetal Ultrasounds or other Referrals:  None Maternal Substance Abuse:  No Significant Maternal Medications:  None Significant Maternal Lab Results:  None Number of Prenatal Visits:greater than 3 verified prenatal visits Other Comments:  None  No results found for this or any previous visit (from the past 24 hour(s)).  Patient Active Problem List   Diagnosis Date Noted   History of intracranial hemorrhage, 2016, due to fall(at [place]) 07/08/2022   Encounter for supervision of normal pregnancy,  antepartum 01/14/2022   Short interval between pregnancies affecting pregnancy, antepartum 01/14/2022   History of gestational diabetes in prior pregnancy, currently pregnant 01/27/2021   Anxiety and depression 05/15/2020   Asthma 12/24/2014    Assessment/Plan:  BERNETHA ANSCHUTZ is a 26 y.o. G3P2002 at [redacted]w[redacted]d here for SOL and vaginal bleeding.   #Labor:SOL, pitocin, continue to monitor  #Pain: Epidural  #FWB: Cat 1  #ID:  GBS negative #MOF: Bottle #MOC:Postplacental IUD #Circ:  Yes  Lowry Ram, MD  07/09/2022, 5:53 AM  GME ATTESTATION:  I saw and evaluated the patient. I agree with the findings and the plan of care as documented in the resident's note. I have made changes to documentation as necessary.  Lavonda Jumbo, DO OB Fellow, Faculty [facility], Center for [facility] 07/10/2022, 9:04 AM

## 2022-07-09 NOTE — Anesthesia Procedure Notes (Signed)
Epidural Patient location during procedure: OB Start time: 07/09/2022 7:37 AM End time: 07/09/2022 7:45 AM  Staffing Anesthesiologist: Pervis Hocking, DO Performed: anesthesiologist   Preanesthetic Checklist Completed: patient identified, IV checked, risks and benefits discussed, monitors and equipment checked, pre-op evaluation and timeout performed  Epidural Patient position: sitting Prep: DuraPrep and site prepped and draped Patient monitoring: continuous pulse ox, blood pressure, heart rate and cardiac monitor Approach: midline Location: L3-L4 Injection technique: LOR air  Needle:  Needle type: Tuohy  Needle gauge: 17 G Needle length: 9 cm Needle insertion depth: 4 cm Catheter type: closed end flexible Catheter size: 19 Gauge Catheter at skin depth: 9 cm Test dose: negative  Assessment Sensory level: T8 Events: blood not aspirated, injection not painful, no injection resistance, no paresthesia and negative IV test  Additional Notes Patient identified. Risks/Benefits/Options discussed with patient including but not limited to bleeding, infection, nerve damage, paralysis, failed block, incomplete pain control, headache, blood pressure changes, nausea, vomiting, reactions to medication both or allergic, itching and postpartum back pain. Confirmed with bedside nurse the patient's most recent platelet count. Confirmed with patient that they are not currently taking any anticoagulation, have any bleeding history or any family history of bleeding disorders. Patient expressed understanding and wished to proceed. All questions were answered. Sterile technique was used throughout the entire procedure. Please see nursing notes for vital signs. Test dose was given through epidural catheter and negative prior to continuing to dose epidural or start infusion. Warning signs of high block given to the patient including shortness of breath, tingling/numbness in hands, complete motor  block, or any concerning symptoms with instructions to call for help. Patient was given instructions on fall risk and not to get out of bed. All questions and concerns addressed with instructions to call with any issues or inadequate analgesia.  Reason for block:procedure for pain

## 2022-07-09 NOTE — MAU Provider Note (Signed)
Event Date/Time   First Provider Initiated Contact with Patient 07/09/22 [phone]      S: Ms. Katelyn Martinez is a 26 y.o. G3P2002 at [redacted]w[redacted]d  who presents to MAU today complaining contractions since 0400. She endorses vaginal bleeding. She denies LOF. She reports normal fetal movement.    O: BP 127/85 (BP Location: Right Arm)   Pulse 66   Temp 98 F (36.7 C) (Oral)   Resp 16   LMP  (LMP Unknown)   SpO2 97%  GENERAL: Well-developed, well-nourished female in no acute distress.  HEAD: Normocephalic, atraumatic.  CHEST: Normal effort of breathing, regular heart rate ABDOMEN: Soft, nontender, gravid  Cervical exam:  Dilation: 4 Effacement (%): 70 Cervical Position: Posterior Presentation: Vertex Exam by:: Milinda Cave CNM Membranes bulging Moderate amt blood noted at introitus. Blood on exam glove. No clots appreciated.   Fetal Monitoring: Baseline: 125 Variability: Moderate Accelerations: Present Decelerations: None Contractions: Q1-9min   A: SIUP at [redacted]w[redacted]d  Active labor Vaginal Bleeding Cat I FT  P: NST Reactive Admit for labor Nurse to contact labor team  Gavin Pound, [place] 07/09/2022 5:45 AM

## 2022-07-10 LAB — CBC
HCT: 29.9 % — ABNORMAL LOW (ref 36.0–46.0)
Hemoglobin: 9.6 g/dL — ABNORMAL LOW (ref 12.0–15.0)
MCH: 24.7 pg — ABNORMAL LOW (ref 26.0–34.0)
MCHC: 32.1 g/dL (ref 30.0–36.0)
MCV: 77.1 fL — ABNORMAL LOW (ref 80.0–100.0)
Platelets: 176 10*3/uL (ref 150–400)
RBC: 3.88 MIL/uL (ref 3.87–5.11)
RDW: 13.4 % (ref 11.5–15.5)
WBC: 12 10*3/uL — ABNORMAL HIGH (ref 4.0–10.5)
nRBC: 0 % (ref 0.0–0.2)

## 2022-07-10 MED ORDER — FUROSEMIDE 20 MG PO TABS
20.0000 mg | ORAL_TABLET | Freq: Every day | ORAL | Status: DC
  Administered 2022-07-10: 20 mg via ORAL
  Filled 2022-07-10 (×2): qty 1

## 2022-07-10 NOTE — Progress Notes (Signed)
Post Partum Day 1 Subjective: no complaints, up ad lib, voiding, and tolerating PO  Objective: Blood pressure 110/79, pulse 98, temperature 98.2 F (36.8 C), temperature source Oral, resp. rate 16, SpO2 100 %, unknown if currently breastfeeding.  Physical Exam:  General: alert, cooperative, and appears stated age 26: appropriate Uterine Fundus: firm DVT Evaluation: No evidence of DVT seen on physical exam.  Recent Labs    07/09/22 0627 07/10/22 0402  HGB 10.3* 9.6*  HCT 33.4* 29.9*    Assessment/Plan: Plan for discharge tomorrow, Circumcision prior to discharge, and Contraception pp IUD Deciding about Circumcision in Baby Boys  (The Basics)  What is circumcision?   Circumcision is a surgery that removes the skin that covers the tip of the penis, called the "foreskin" Circumcision is usually done when a boy is between 14 and 33 days old. In the [place], circumcision is common. In some other countries, fewer boys are circumcised. Circumcision is a common tradition in some religions.  Should I have my baby boy circumcised?   There is no easy answer. Circumcision has some benefits. But it also has risks. After talking with your doctor, you will have to decide for yourself what is right for your family.  What are the benefits of circumcision?   Circumcised boys seem to have slightly lower rates of: ?Urinary tract infections ?Swelling of the opening at the tip of the penis Circumcised men seem to have slightly lower rates of: ?Urinary tract infections ?Swelling of the opening at the tip of the penis ?Penis cancer ?HIV and other infections that you catch during sex ?Cervical cancer in the women they have sex with Even so, in the [place], the risks of these problems are small - even in boys and men who have not been circumcised. Plus, boys and men who are not circumcised can reduce these extra risks by: ?Cleaning their penis well ?Using condoms during  sex  What are the risks of circumcision?  Risks include: ?Bleeding or infection from the surgery ?Damage to or amputation of the penis ?A chance that the doctor will cut off too much or not enough of the foreskin ?A chance that sex won't feel as good later in life Only about 1 out of every 200 circumcisions leads to problems. There is also a chance that your health insurance won't pay for circumcision.  How is circumcision done in baby boys?  First, the baby gets medicine for pain relief. This might be a cream on the skin or a shot into the base of the penis. Next, the doctor cleans the baby's penis well. Then he or she uses special tools to cut off the foreskin. Finally, the doctor wraps a bandage (called gauze) around the baby's penis. If you have your baby circumcised, his doctor or nurse will give you instructions on how to care for him after the surgery. It is important that you follow those instructions carefully.  Per pt, may need Urology for circ-->will discuss with peds and review exam.   LOS: 1 day   Donnamae Jude, MD 07/10/2022, 8:22 AM

## 2022-07-10 NOTE — Progress Notes (Signed)
CSW received consult for hx of Anxiety and Depression and history of marijuana use. CSW met with MOB to offer support and complete assessment. When CSW entered room, FOB was present, asleep on couch. Infant "Dameko" was observed sleeping on back in bassinet. CSW requested to speak with MOB alone. MOB provided verbal consent to complete consult with FOB present.   CSW inquired about substance use during pregnancy due to discrepancy in MOB's H&P chart review as follows:   Drug use: Not Currently      Frequency: 7.0 times per week      Types: Marijuana  MOB denied illicit substance use during pregnancy. Substance use referral was screened out due to the following: ~MOB had no documented substance use after initial prenatal visit/+UPT. ~MOB had no positive drug screens after initial prenatal visit/+UPT.  MOB completed assessment for history of depression/anxiety. CSW inquired how MOB felt emotionally during pregnancy. MOB reports that she felt "fine" during pregnancy sharing that she feels ready to go home. MOB reports she has stayed busy by spending time with friends and getting out of the house during pregnancy. CSW commended MOB for her efforts. CSW inquired about MOB's mental health history. MOB reports she does not know when she was first diagnosed with depression and anxiety, sharing "it's an on and off thing." MOB reports she has learned how to cope with symptoms of depression and anxiety, reporting she has tried taking psychotropic medication (Lexapro) in the past but did not like the way it made her feel, adding that the medication made her feel disassociated. MOB reports she attended therapy at age 26 through a youth therapist but reported it was not helpful.  CSW inquired if MOB has a history of PMADs. MOB reports she experienced postpartum depression after she gave birth to her son 5 years ago, sharing that her dad passed away 4 days after her son gave birth. CSW expressed condolences. MOB  identified FOB, her mother and MIL as supports. MOB denied current SI/HI.   MOB reports she has all needed items for infant, including a car seat and bassinet. MOB has chosen [facility] as infant's pediatrician office. MOB reports she receives [facility] and food stamps and plans to update her case workers tomorrow about infant's birth. MOB declined additional resource needs at this time.  CSW provided education regarding the baby blues period vs. perinatal mood disorders, discussed treatment and gave resources for mental health follow up if concerns arise.  CSW recommends self-evaluation during the postpartum time period using the New Mom Checklist from Postpartum Progress and encouraged MOB to contact a medical professional if symptoms are noted at any time.    CSW declined review of Sudden Infant Death Syndrome (SIDS) precautions, stating she is aware of safe sleep practices.  CSW identifies no further need for intervention and no barriers to discharge at this time.  Signed,  Berniece Salines, MSW, LCSWA, LCASA 07/10/2022 4:59 PM

## 2022-07-10 NOTE — Anesthesia Postprocedure Evaluation (Signed)
Anesthesia Post Note  Patient: Georgiann Hahn  Procedure(s) Performed: AN AD HOC LABOR EPIDURAL     Patient location during evaluation: Mother Baby Anesthesia Type: Epidural Level of consciousness: awake and alert Pain management: pain level controlled Vital Signs Assessment: post-procedure vital signs reviewed and stable Respiratory status: spontaneous breathing, nonlabored ventilation and respiratory function stable Cardiovascular status: stable Postop Assessment: no headache, no backache, epidural receding, no apparent nausea or vomiting, patient able to bend at knees, adequate PO intake and able to ambulate Anesthetic complications: no   No notable events documented.  Last Vitals:  Vitals:   07/09/22 2300 07/10/22 0300  BP: 129/84 110/79  Pulse: 74 98  Resp: 16 16  Temp: 36.7 C 36.8 C  SpO2: 100% 100%    Last Pain:  Vitals:   07/10/22 0803  TempSrc:   PainSc: Asleep   Pain Goal:                   Jabier Mutton

## 2022-07-11 ENCOUNTER — Other Ambulatory Visit (HOSPITAL_COMMUNITY): Payer: Self-pay

## 2022-07-11 MED ORDER — FUROSEMIDE 20 MG PO TABS
20.0000 mg | ORAL_TABLET | Freq: Every day | ORAL | 0 refills | Status: DC
  Filled 2022-07-11: qty 30, 30d supply, fill #0

## 2022-07-11 MED ORDER — IBUPROFEN 600 MG PO TABS
600.0000 mg | ORAL_TABLET | Freq: Four times a day (QID) | ORAL | 0 refills | Status: DC
  Filled 2022-07-11: qty 30, 8d supply, fill #0

## 2022-07-11 MED ORDER — ACETAMINOPHEN 325 MG PO TABS
650.0000 mg | ORAL_TABLET | ORAL | 0 refills | Status: DC | PRN
  Filled 2022-07-11: qty 30, 3d supply, fill #0

## 2022-07-11 MED ORDER — PRENATAL MULTIVITAMIN CH
1.0000 | ORAL_TABLET | Freq: Every day | ORAL | 0 refills | Status: DC
  Filled 2022-07-11: qty 30, 30d supply, fill #0

## 2022-07-15 ENCOUNTER — Encounter: Payer: Self-pay | Admitting: Advanced Practice Midwife

## 2022-07-15 ENCOUNTER — Encounter: Payer: Medicaid Other | Admitting: Advanced Practice Midwife

## 2022-07-15 ENCOUNTER — Ambulatory Visit (INDEPENDENT_AMBULATORY_CARE_PROVIDER_SITE_OTHER): Payer: Medicaid Other | Admitting: Advanced Practice Midwife

## 2022-07-15 VITALS — BP 106/76 | HR 81

## 2022-07-15 DIAGNOSIS — Z9189 Other specified personal risk factors, not elsewhere classified: Secondary | ICD-10-CM

## 2022-07-15 NOTE — Progress Notes (Signed)
   GYN VISIT Patient name: Katelyn Martinez MRN [id number]  Date of birth: Jan 03, 1996 Chief Complaint:   Postpartum Care (1 week depression check)  History of Present Illness:   Katelyn Martinez is a 26 y.o. G52P3003 Caucasian female being seen today for assessment of postpartum depression s/p vag del x 6d ago. States she is doing well but tired; has good support at home. Bottlefeeding. Baby just had circ today w Dr Nelda Marseille.   No LMP recorded. The current method of family planning is  postplacental IUD placed .  Last pap Nov 2021. Results were: NILM w/ HRHPV not done        07/15/2022   10:19 AM 07/09/2022    4:48 PM 08/02/2021    5:31 AM 07/18/2017   11:08 AM 06/19/2017    9:00 PM  Edinburgh Postnatal Depression Scale Screening Tool  I have been able to laugh and see the funny side of things. 0 0 0 0 0  I have looked forward with enjoyment to things. 0 0 0 0 0  I have blamed myself unnecessarily when things went wrong. 2 0 1 0 0  I have been anxious or worried for no good reason. 1 0 1 0 0  I have felt scared or panicky for no good reason. 1 0 0 0 0  Things have been getting on top of me. 1 0 1 0 0  I have been so unhappy that I have had difficulty sleeping. 1 0 1 0 0  I have felt sad or miserable. 1 0 1 0 0  I have been so unhappy that I have been crying. 0 0 0 0 0  The thought of harming myself has occurred to me. 0 0 0 0 0  Edinburgh Postnatal Depression Scale Total 7 0 5 0 0      Review of Systems:   Pertinent items are noted in HPI Denies fever/chills, dizziness, headaches, visual disturbances, fatigue, shortness of breath, chest pain, abdominal pain, vomiting, abnormal vaginal discharge/itching/odor/irritation, problems with periods, bowel movements, urination, or intercourse unless otherwise stated above.  Pertinent History Reviewed:  Reviewed past medical,surgical, social, obstetrical and family history.  Reviewed problem list, medications and allergies. Physical  Assessment:   Vitals:   07/15/22 1021  BP: 106/76  Pulse: 81  There is no height or weight on file to calculate BMI.       Physical Examination:   General appearance: alert, well appearing, and in no distress  Mental status: alert, oriented to person, place, and time  Skin: warm & dry   Cardiovascular: normal heart rate noted  Respiratory: normal respiratory effort, no distress  Abdomen: soft, non-tender   Pelvic: examination not indicated  Extremities: no edema    No results found for this or any previous visit (from the past 24 hour(s)).  Assessment & Plan:  1) Mild PP depression> feels like when she gets some sleep that things will improve; has good support; PP blues and www.postpartum.net resource offered   Meds: No orders of the defined types were placed in this encounter.   No orders of the defined types were placed in this encounter.   Return for As scheduled.  Myrtis Ser CNM 07/15/2022 10:41 AM

## 2022-07-15 NOTE — Patient Instructions (Signed)
Check out the website www.postpartum.net for helpful tips

## 2022-07-22 ENCOUNTER — Encounter: Payer: Medicaid Other | Admitting: Advanced Practice Midwife

## 2022-07-29 ENCOUNTER — Encounter: Payer: Medicaid Other | Admitting: Obstetrics & Gynecology

## 2022-08-06 DIAGNOSIS — H01001 Unspecified blepharitis right upper eyelid: Secondary | ICD-10-CM | POA: Diagnosis not present

## 2022-08-06 DIAGNOSIS — Z681 Body mass index (BMI) 19 or less, adult: Secondary | ICD-10-CM | POA: Diagnosis not present

## 2022-08-06 DIAGNOSIS — H6092 Unspecified otitis externa, left ear: Secondary | ICD-10-CM | POA: Diagnosis not present

## 2022-08-22 ENCOUNTER — Ambulatory Visit: Payer: Medicaid Other | Admitting: Women's Health

## 2022-08-27 ENCOUNTER — Other Ambulatory Visit: Payer: Self-pay

## 2022-08-27 ENCOUNTER — Encounter (HOSPITAL_COMMUNITY): Payer: Self-pay

## 2022-08-27 ENCOUNTER — Emergency Department (HOSPITAL_COMMUNITY): Payer: Medicaid Other

## 2022-08-27 ENCOUNTER — Emergency Department (HOSPITAL_COMMUNITY)
Admission: EM | Admit: 2022-08-27 | Discharge: 2022-08-27 | Disposition: A | Discharge status: Home / Self Care | Payer: Medicaid Other | Attending: Emergency Medicine | Admitting: Emergency Medicine

## 2022-08-27 DIAGNOSIS — Z20822 Contact with and (suspected) exposure to covid-19: Secondary | ICD-10-CM | POA: Insufficient documentation

## 2022-08-27 DIAGNOSIS — R569 Unspecified convulsions: Secondary | ICD-10-CM | POA: Insufficient documentation

## 2022-08-27 DIAGNOSIS — S199XXA Unspecified injury of neck, initial encounter: Secondary | ICD-10-CM | POA: Diagnosis not present

## 2022-08-27 DIAGNOSIS — S0993XA Unspecified injury of face, initial encounter: Secondary | ICD-10-CM | POA: Diagnosis not present

## 2022-08-27 DIAGNOSIS — R059 Cough, unspecified: Secondary | ICD-10-CM | POA: Diagnosis not present

## 2022-08-27 DIAGNOSIS — J45909 Unspecified asthma, uncomplicated: Secondary | ICD-10-CM | POA: Diagnosis not present

## 2022-08-27 DIAGNOSIS — R040 Epistaxis: Secondary | ICD-10-CM | POA: Diagnosis not present

## 2022-08-27 DIAGNOSIS — R9431 Abnormal electrocardiogram [ECG] [EKG]: Secondary | ICD-10-CM | POA: Diagnosis not present

## 2022-08-27 DIAGNOSIS — S0083XA Contusion of other part of head, initial encounter: Secondary | ICD-10-CM | POA: Diagnosis not present

## 2022-08-27 DIAGNOSIS — W06XXXA Fall from bed, initial encounter: Secondary | ICD-10-CM | POA: Insufficient documentation

## 2022-08-27 DIAGNOSIS — J3489 Other specified disorders of nose and nasal sinuses: Secondary | ICD-10-CM | POA: Diagnosis not present

## 2022-08-27 DIAGNOSIS — S0990XA Unspecified injury of head, initial encounter: Secondary | ICD-10-CM | POA: Diagnosis present

## 2022-08-27 DIAGNOSIS — Q7649 Other congenital malformations of spine, not associated with scoliosis: Secondary | ICD-10-CM | POA: Diagnosis not present

## 2022-08-27 DIAGNOSIS — R58 Hemorrhage, not elsewhere classified: Secondary | ICD-10-CM | POA: Diagnosis not present

## 2022-08-27 DIAGNOSIS — R825 Elevated urine levels of drugs, medicaments and biological substances: Secondary | ICD-10-CM | POA: Diagnosis not present

## 2022-08-27 LAB — CBC WITH DIFFERENTIAL/PLATELET
Abs Immature Granulocytes: 0.02 10*3/uL (ref 0.00–0.07)
Basophils Absolute: 0 10*3/uL (ref 0.0–0.1)
Basophils Relative: 1 %
Eosinophils Absolute: 0.5 10*3/uL (ref 0.0–0.5)
Eosinophils Relative: 8 %
HCT: 40.6 % (ref 36.0–46.0)
Hemoglobin: 12.4 g/dL (ref 12.0–15.0)
Immature Granulocytes: 0 %
Lymphocytes Relative: 32 %
Lymphs Abs: 2.2 10*3/uL (ref 0.7–4.0)
MCH: 24.7 pg — ABNORMAL LOW (ref 26.0–34.0)
MCHC: 30.5 g/dL (ref 30.0–36.0)
MCV: 80.7 fL (ref 80.0–100.0)
Monocytes Absolute: 0.6 10*3/uL (ref 0.1–1.0)
Monocytes Relative: 9 %
Neutro Abs: 3.6 10*3/uL (ref 1.7–7.7)
Neutrophils Relative %: 50 %
Platelets: 252 10*3/uL (ref 150–400)
RBC: 5.03 MIL/uL (ref 3.87–5.11)
RDW: 17.7 % — ABNORMAL HIGH (ref 11.5–15.5)
WBC: 7 10*3/uL (ref 4.0–10.5)
nRBC: 0 % (ref 0.0–0.2)

## 2022-08-27 LAB — URINALYSIS, ROUTINE W REFLEX MICROSCOPIC
Bilirubin Urine: NEGATIVE
Glucose, UA: NEGATIVE mg/dL
Hgb urine dipstick: NEGATIVE
Ketones, ur: NEGATIVE mg/dL
Nitrite: NEGATIVE
Protein, ur: NEGATIVE mg/dL
Specific Gravity, Urine: 1.019 (ref 1.005–1.030)
pH: 6 (ref 5.0–8.0)

## 2022-08-27 LAB — RAPID URINE DRUG SCREEN, HOSP PERFORMED
Amphetamines: NOT DETECTED
Barbiturates: NOT DETECTED
Benzodiazepines: POSITIVE — AB
Cocaine: NOT DETECTED
Opiates: POSITIVE — AB
Tetrahydrocannabinol: POSITIVE — AB

## 2022-08-27 LAB — COMPREHENSIVE METABOLIC PANEL
ALT: 24 U/L (ref 0–44)
AST: 27 U/L (ref 15–41)
Albumin: 4 g/dL (ref 3.5–5.0)
Alkaline Phosphatase: 55 U/L (ref 38–126)
Anion gap: 6 (ref 5–15)
BUN: 16 mg/dL (ref 6–20)
CO2: 21 mmol/L — ABNORMAL LOW (ref 22–32)
Calcium: 8.7 mg/dL — ABNORMAL LOW (ref 8.9–10.3)
Chloride: 113 mmol/L — ABNORMAL HIGH (ref 98–111)
Creatinine, Ser: 0.89 mg/dL (ref 0.44–1.00)
GFR, Estimated: >60 mL/min (ref >60)
Glucose, Bld: 88 mg/dL (ref 70–99)
Potassium: 3.5 mmol/L (ref 3.5–5.1)
Sodium: 140 mmol/L (ref 135–145)
Total Bilirubin: 0.6 mg/dL (ref 0.3–1.2)
Total Protein: 6.9 g/dL (ref 6.5–8.1)

## 2022-08-27 LAB — ETHANOL: Alcohol, Ethyl (B): <10 mg/dL (ref <10)

## 2022-08-27 LAB — RESP PANEL BY RT-PCR (RSV, FLU A&B, COVID)  RVPGX2
Influenza A by PCR: NEGATIVE
Influenza B by PCR: NEGATIVE
Resp Syncytial Virus by PCR: NEGATIVE
SARS Coronavirus 2 by RT PCR: NEGATIVE

## 2022-08-27 LAB — HCG, QUANTITATIVE, PREGNANCY: hCG, Beta Chain, Quant, S: <1 m[IU]/mL (ref <5)

## 2022-08-27 LAB — CBG MONITORING, ED: Glucose-Capillary: 82 mg/dL (ref 70–99)

## 2022-08-27 MED ORDER — SODIUM CHLORIDE 0.9 % IV BOLUS
1000.0000 mL | Freq: Once | INTRAVENOUS | Status: AC
  Administered 2022-08-27: 1000 mL via INTRAVENOUS

## 2022-08-27 MED ORDER — KETOROLAC TROMETHAMINE 30 MG/ML IJ SOLN
30.0000 mg | Freq: Once | INTRAMUSCULAR | Status: AC
  Administered 2022-08-27: 30 mg via INTRAVENOUS
  Filled 2022-08-27: qty 1

## 2022-08-27 MED ORDER — MORPHINE SULFATE (PF) 4 MG/ML IV SOLN
4.0000 mg | Freq: Once | INTRAVENOUS | Status: AC
  Administered 2022-08-27: 4 mg via INTRAVENOUS
  Filled 2022-08-27: qty 1

## 2022-08-27 MED ORDER — ONDANSETRON HCL 4 MG/2ML IJ SOLN
4.0000 mg | Freq: Once | INTRAMUSCULAR | Status: AC
  Administered 2022-08-27: 4 mg via INTRAVENOUS
  Filled 2022-08-27: qty 2

## 2022-08-27 MED ORDER — LEVETIRACETAM 500 MG PO TABS: 500.0000 mg | ORAL_TABLET | Freq: Two times a day (BID) | ORAL | 0 refills | Status: DC

## 2022-08-27 MED ORDER — LEVETIRACETAM IN NACL 1000 MG/100ML IV SOLN
1000.0000 mg | Freq: Once | INTRAVENOUS | Status: AC
  Administered 2022-08-27: 1000 mg via INTRAVENOUS
  Filled 2022-08-27: qty 100

## 2022-08-27 NOTE — Discharge Instructions (Signed)
No driving for 6 months until cleared by neurology 

## 2022-08-27 NOTE — ED Notes (Signed)
Patient transported to CT 

## 2022-08-27 NOTE — ED Notes (Signed)
Patient back from CT.

## 2022-08-27 NOTE — ED Notes (Signed)
ED Provider at bedside. 

## 2022-08-27 NOTE — ED Notes (Signed)
Attempted to obtain urine sample, pt says she is unable to provide at this time.

## 2022-08-27 NOTE — ED Triage Notes (Signed)
Pt arrived from home via [facility] following seizure like activity observed by Pts boyfriend. Per EMS, Pts boyfriend observed Pt roll out of bed striking her head on the nightstand. Following this injury Pt displayed seizure like activity on the floor striking her face cause a now resolved episode of epitaxis. Pt disoriented to time and cannot recall anything leading up to this incident. Pt alert to herself, and place at this time.

## 2022-08-27 NOTE — ED Provider Notes (Signed)
[facility] EMERGENCY DEPARTMENT Provider Note   CSN: [id number] Arrival date & time: 08/27/22  1027     History  Chief Complaint  Patient presents with   Seizures    Katelyn Martinez is a 26 y.o. female.  Pt is a 26 yo female with a pmhx significant for asthma and significant head trauma in [place] [employer] [place].  Pt had a baby on 10/21 with no problems during pregnancy or during delivery.  This morning, pt had seizure like activity in bed which was witnessed by her boyfriend.  She did hit her head on the nightstand.  She did not bite her tongue, but did bite her lip.  She was initially disoriented, but is now awake and alert.  She complains of headache.  She does not remember incident.  Pt said she has not been feeling well for the past 2 weeks.  Her kids have been sick as well.   I spoke with pt's boyfriend.  He woke up to the sound of her falling on the ground.  She looked over and saw her on the ground with some blood on her face.  She then had a seizure that lasted about 1 minute.  He said her arms and legs became stiff and she started shaking.  He turned her on her side until EMS arrived.       Home Medications Prior to Admission medications   Medication Sig Start Date End Date Taking? Authorizing Provider  levETIRAcetam (KEPPRA) 500 MG tablet Take 1 tablet (500 mg total) by mouth 2 (two) times daily. 08/27/22  Yes Jacalyn Lefevre, MD  ibuprofen (ADVIL) 600 MG tablet Take 1 tablet (600 mg total) by mouth every 6 (six) hours. 07/11/22   Autry-Lott, Randa Evens, DO  Prenatal Vit-Fe Fumarate-FA (PRENATAL MULTIVITAMIN) TABS tablet Take 1 tablet by mouth daily at 12 noon. 07/11/22   Autry-Lott, Randa Evens, DO      Allergies    Patient has no known allergies.    Review of Systems   Review of Systems  Neurological:  Positive for seizures and headaches.  All other systems reviewed and are negative.   Physical Exam Updated Vital Signs BP 109/76   Pulse (!) 58    Temp 98.2 F (36.8 C)   Resp 18   Ht  (1.651 m)   Wt 63 kg   LMP  (LMP Unknown)   SpO2 99%   BMI 23.11 kg/m  Physical Exam Vitals and nursing note reviewed.  Constitutional:      Appearance: Normal appearance.  HENT:     Head: Normocephalic.     Comments: Contusion to right forehead.  She has a small amt of bleeding from multiple nasal piercings.    Right Ear: External ear normal.     Left Ear: External ear normal.     Mouth/Throat:     Mouth: Mucous membranes are moist.  Eyes:     Extraocular Movements: Extraocular movements intact.     Conjunctiva/sclera: Conjunctivae normal.     Pupils: Pupils are equal, round, and reactive to light.  Cardiovascular:     Rate and Rhythm: Normal rate and regular rhythm.     Pulses: Normal pulses.     Heart sounds: Normal heart sounds.  Pulmonary:     Effort: Pulmonary effort is normal.     Breath sounds: Normal breath sounds.  Abdominal:     General: Abdomen is flat. Bowel sounds are normal.  Palpations: Abdomen is soft.  Musculoskeletal:        General: Normal range of motion.     Cervical back: Normal range of motion and neck supple.  Skin:    General: Skin is warm.     Capillary Refill: Capillary refill takes less than 2 seconds.  Neurological:     General: No focal deficit present.     Mental Status: She is alert and oriented to person, place, and time.  Psychiatric:        Mood and Affect: Mood normal.        Behavior: Behavior normal.     ED Results / Procedures / Treatments   Labs (all labs ordered are listed, but only abnormal results are displayed) Labs Reviewed  COMPREHENSIVE METABOLIC PANEL - Abnormal; Notable for the following components:      Result Value   Chloride 113 (*)    CO2 21 (*)    Calcium 8.7 (*)    All other components within normal limits  CBC WITH DIFFERENTIAL/PLATELET - Abnormal; Notable for the following components:   MCH 24.7 (*)    RDW 17.7 (*)    All other components within normal  limits  RAPID URINE DRUG SCREEN, HOSP PERFORMED - Abnormal; Notable for the following components:   Opiates POSITIVE (*)    Benzodiazepines POSITIVE (*)    Tetrahydrocannabinol POSITIVE (*)    All other components within normal limits  URINALYSIS, ROUTINE W REFLEX MICROSCOPIC - Abnormal; Notable for the following components:   APPearance HAZY (*)    Leukocytes,Ua SMALL (*)    Bacteria, UA RARE (*)    All other components within normal limits  RESP PANEL BY RT-PCR (RSV, FLU A&B, COVID)  RVPGX2  ETHANOL  HCG, QUANTITATIVE, PREGNANCY  CBG MONITORING, ED    EKG EKG Interpretation  Date/Time:  Saturday August 27 2022 06:11:29 EST Ventricular Rate:  96 PR Interval:  105 QRS Duration: 94 QT Interval:  350 QTC Calculation: 443 R Axis:   86 Text Interpretation: Sinus rhythm Short PR interval Confirmed by Zadie Rhine ([id number]) on 08/27/2022 6:27:09 AM  Radiology CT Cervical Spine Wo Contrast  Result Date: 08/27/2022 CLINICAL DATA:  26 year old female fell from bed, struck head on furniture. Seizure like activity. Epistaxis. EXAM: CT CERVICAL SPINE WITHOUT CONTRAST TECHNIQUE: Multidetector CT imaging of the cervical spine was performed without intravenous contrast. Multiplanar CT image reconstructions were also generated. RADIATION DOSE REDUCTION: This exam was performed according to the departmental dose-optimization program which includes automated exposure control, adjustment of the mA and/or kV according to patient size and/or use of iterative reconstruction technique. COMPARISON:  CT head and face today. FINDINGS: Alignment: Motion artifact at the C6-C7 level. Cervicothoracic junction alignment is within normal limits. Posterior element alignment appears maintained. Straightening of cervical lordosis. Skull base and vertebrae: Motion artifact at the bilateral TMJ on these images, but the right TMJ now appears aligned on series 6, image 11. Visualized skull base is intact. No  atlanto-occipital dissociation. C1 and C2 appear intact and aligned. Congenital incomplete ossification of the posterior C1 ring, normal variant. Bone mineralization is within normal limits. Motion artifact at C6 and C7. No acute osseous abnormality identified. Soft tissues and spinal canal: No prevertebral fluid or swelling. No visible canal hematoma. Mild motion artifact but otherwise negative noncontrast visible neck soft tissues. Disc levels: Negative when allowing for lower cervical motion artifact. Upper chest: Negative. IMPRESSION: 1. Motion artifact at C6 and C7 level. No acute traumatic injury identified in  the cervical spine. 2. Right TMJ appears normally aligned on these images, with ongoing bilateral TMJ motion artifact. Electronically Signed   By: Odessa Fleming M.D.   On: 08/27/2022 07:37   DG Chest Portable 1 View  Result Date: 08/27/2022 CLINICAL DATA:  Cough.  Larey Seat out of bed. EXAM: PORTABLE CHEST 1 VIEW COMPARISON:  10/15/2020 FINDINGS: Lungs are hyperexpanded. Leftward patient rotation distorts cardiomediastinal anatomy. The cardiopericardial silhouette is within normal limits for size. The lungs are clear without focal pneumonia, edema, pneumothorax or pleural effusion. The visualized bony structures of the thorax are unremarkable. Telemetry leads overlie the chest. IMPRESSION: Hyperexpansion without acute cardiopulmonary findings. Electronically Signed   By: Kennith Center M.D.   On: 08/27/2022 07:34   CT Maxillofacial Wo Contrast  Result Date: 08/27/2022 CLINICAL DATA:  26 year old female fell from bed, struck head on furniture. Seizure like activity. Epistaxis. EXAM: CT MAXILLOFACIAL WITHOUT CONTRAST TECHNIQUE: Multidetector CT imaging of the maxillofacial structures was performed. Multiplanar CT image reconstructions were also generated. RADIATION DOSE REDUCTION: This exam was performed according to the departmental dose-optimization program which includes automated exposure control,  adjustment of the mA and/or kV according to patient size and/or use of iterative reconstruction technique. COMPARISON:  Head CT today. FINDINGS: Osseous: Intermittent motion artifact, including at the right mandible condyle which appears anteriorly subluxated on series 9, image 17. No definite mandible fracture. Left TMJ is normally aligned. No acute dental finding. Bilateral maxilla, pterygoid, and zygoma appear intact. No definite nasal bone fracture. Central skull base appears intact. Grossly intact visible cervical vertebrae, congenital incomplete ossification of the posterior C1 ring. Healed left temporal bone fracture seen in 2016. Orbits: Intact orbital walls. Leftward gaze. Globes and other orbits soft tissues appear normal. Sinuses: Clear throughout. Soft tissues: Retained secretions and mucosal thickening throughout the nasal cavity. Retained secretions in the nasopharynx. Negative visible noncontrast thyroid, larynx, parapharyngeal spaces, retropharyngeal space, sublingual space, submandibular spaces, masticator and parotid spaces. Nasal piercings. No obvious superficial soft tissue injury. Limited intracranial: Reported separately today. IMPRESSION: 1. Motion artifact, dense but appearance suspicious for anterior subluxation of the right TMJ. Query jaw limited range of motion. Left TMJ appears aligned and there is no definite mandible fracture. 2. Retained secretions and mucosal thickening in the nasal cavity and nasopharynx. 3. No other acute traumatic injury identified in the Face. Electronically Signed   By: Odessa Fleming M.D.   On: 08/27/2022 07:18   CT Head Wo Contrast  Result Date: 08/27/2022 CLINICAL DATA:  26 year old female fell from bed, struck head on furniture. Seizure like activity. Epistaxis. EXAM: CT HEAD WITHOUT CONTRAST TECHNIQUE: Contiguous axial images were obtained from the base of the skull through the vertex without intravenous contrast. RADIATION DOSE REDUCTION: This exam was  performed according to the departmental dose-optimization program which includes automated exposure control, adjustment of the mA and/or kV according to patient size and/or use of iterative reconstruction technique. COMPARISON:  Head CT 12/27/2014. FINDINGS: Brain: Cerebral volume remains within normal limits. Small frontal and temporal lobe hemorrhagic contusions on the 2016 comparison, with mild inferior frontal gyrus encephalomalacia greater on the right (series 5, image 15). Similar anterior inferior temporal lobe encephalomalacia on image 28. No midline shift, ventriculomegaly, mass effect, evidence of mass lesion, intracranial hemorrhage or evidence of cortically based acute infarction. Gray-white matter differentiation otherwise within normal limits. Vascular: No suspicious intracranial vascular hyperdensity. Skull: No acute osseous abnormality identified. Left temporal bone fracture appears healed since 2016. Sinuses/Orbits: Tympanic cavities, Visualized paranasal sinuses and mastoids  are clear. Other: Visualized orbits and scalp soft tissues are within normal limits. Face CT is reported separately. IMPRESSION: 1. No acute intracranial abnormality or acute traumatic injury identified. 2. 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 3. Face CT is reported separately. Electronically Signed   By: Odessa FlemingH  Hall M.D.   On: 08/27/2022 07:06    Procedures Procedures    Medications Ordered in ED Medications  sodium chloride 0.9 % bolus 1,000 mL (1,000 mLs Intravenous New Bag/Given 08/27/22 0745)  ondansetron (ZOFRAN) injection 4 mg (4 mg Intravenous Given 08/27/22 0736)  morphine (PF) 4 MG/ML injection 4 mg (4 mg Intravenous Given 08/27/22 0735)  ketorolac (TORADOL) 30 MG/ML injection 30 mg (30 mg Intravenous Given 08/27/22 0957)  levETIRAcetam (KEPPRA) IVPB 1000 mg/100 mL premix (1,000 mg Intravenous New Bag/Given 08/27/22 1120)    ED Course/  Medical Decision Making/ A&P                           Medical Decision Making Amount and/or Complexity of Data Reviewed Labs: ordered. Radiology: ordered.  Risk Prescription drug management.   This patient presents to the ED for concern of seizure, this involves an extensive number of treatment options, and is a complaint that carries with it a high risk of complications and morbidity.  The differential diagnosis includes seizure, head injury, electrolyte abn, eclampsia, infection   Co morbidities that complicate the patient evaluation  asthma and significant [place] [employer]   Additional history obtained:  Additional history obtained from epic chart review External records from outside source obtained and reviewed including mother   Lab Tests:  I Ordered, and personally interpreted labs.  The pertinent results include:  cbc nl, cmp nl, preg neg, etoh neg, rsv/flu/covid neg; uds + for opiates (we gave morphine), bzd, and mj; ua neg for infection.  No protein in urine.   Imaging Studies ordered:  I ordered imaging studies including ct head/face/c-spine  I independently visualized and interpreted imaging which showed  CT head: 1. No acute intracranial abnormality or acute traumatic injury  identified.  2. Mild chronic frontal and right temporal lobe encephalomalacia  related to previous hemorrhagic contusions in 2016.  3. Face CT is reported separately.  CT face: Motion artifact, dense but appearance suspicious for anterior  subluxation of the right TMJ. Query jaw limited range of motion.  Left TMJ appears aligned and there is no definite mandible fracture.  2. Retained secretions and mucosal thickening in the nasal cavity  and nasopharynx.  3. No other acute traumatic injury identified in the Face.  CT c-spine: . Motion artifact at C6 and C7 level. No acute traumatic injury  identified in the cervical spine.  2. Right TMJ  appears normally aligned on these images, with ongoing  bilateral TMJ motion artifact.  CXR: Hyperexpansion without acute cardiopulmonary findings.  I agree with the radiologist interpretation   Cardiac Monitoring:  The patient was maintained on a cardiac monitor.  I personally viewed and interpreted the cardiac monitored which showed an underlying rhythm of: nsr   Medicines ordered and prescription drug management:  I ordered medication including morphine/zofran  for pain and nausea  Reevaluation of the patient after these medicines showed that the patient improved I have reviewed the patients home medicines and have made adjustments as needed   Test Considered:  ct   Critical Interventions:  keppra   Consultations Obtained:  I requested consultation with the neurologist (Dr. Amada Jupiter),  and discussed lab and imaging findings as well as pertinent plan - he recommended IV keppra 1 g and then starting her on keppra 500 mg bid.  She also needs to f/u as an outpatient with neuro.   Problem List / ED Course:  Seizure:  Due to prior brain trauma, she is at a higher risk of developing seizures.  She has also been sleep deprived as she's the mom of a newborn.  Event today sounds like a seizure.  I doubt eclampsia as she delivered over 7 weeks ago.  Pt is started on keppra. She is given seizure precautions which include no driving.  She is given the number for neurology f/u. Trauma:  she has some bruising and some scratches, but no internal injury.  There was a question of jaw subluxation on ct, but she has no pain there and is able to open her mouth w/o problems.     Reevaluation:  After the interventions noted above, I reevaluated the patient and found that they have :improved   Social Determinants of Health:  Lives at home   Dispostion:  After consideration of the diagnostic results and the patients response to treatment, I feel that the patent would benefit from  discharge with outpatient f/u.          Final Clinical Impression(s) / ED Diagnoses Final diagnoses:  Seizure [facility])    Rx / DC Orders ED Discharge Orders          Ordered    levETIRAcetam (KEPPRA) 500 MG tablet  2 times daily        08/27/22 1107              Jacalyn Lefevre, MD 08/27/22 1139

## 2022-08-27 NOTE — ED Provider Triage Note (Signed)
Emergency Medicine Provider Triage Evaluation Note  DANILYNN JEMISON , a 26 y.o. female  was evaluated in triage.  Pt complains of new onset seizure.  Review of Systems  Positive: headache Negative: neck  Physical Exam  BP 111/81   Pulse 95   Temp 98.1 F (36.7 C) (Oral)   Resp 16   Ht 1.651 m (5\' 5" )   Wt 63 kg   LMP  (LMP Unknown)   SpO2 100%   BMI 23.11 kg/m  Gen:   Awake, no distress   Resp:  Normal effort  MSK:   Moves extremities without difficulty  Other:  Epistaxis noted  Medical Decision Making  Medically screening exam initiated at 6:25 AM.  Appropriate orders placed.  was informed that the remainder of the evaluation will be completed by another provider, this initial triage assessment does not replace that evaluation, and the importance of remaining in the ED until their evaluation is complete.  CT imaging/labs ordered   [name], MD 08/27/22 [phone]

## 2022-08-30 ENCOUNTER — Telehealth: Payer: Self-pay | Admitting: *Deleted

## 2022-08-30 NOTE — Patient Outreach (Signed)
  Care Coordination [facility] Note Transition Care Management Follow-up Telephone Call Date of discharge and from where: 08/27/22 from [facility] ED How have you been since you were released from the hospital? Patient reports she is doing ok Any questions or concerns? No  Items Reviewed: Did the pt receive and understand the discharge instructions provided? Yes  Medications obtained and verified? Yes  Other? No  Any new allergies since your discharge? No  Dietary orders reviewed? No Do you have support at home? Yes   Home Care and Equipment/Supplies: Were home health services ordered? no If so, what is the name of the agency? N/A  Has the agency set up a time to come to the patient's home? not applicable Were any new equipment or medical supplies ordered?  No What is the name of the medical supply agency? N/A Were you able to get the supplies/equipment? not applicable Do you have any questions related to the use of the equipment or supplies? No  Functional Questionnaire: (I = Independent and D = Dependent) ADLs: I  Bathing/Dressing- I  Meal Prep- I  Eating- I  Maintaining continence- I  Transferring/Ambulation- I  Managing Meds- I  Follow up appointments reviewed:  PCP Hospital f/u appt confirmed?  N/A-ED visit Patient working to establish care with a PCP  Specialist Hospital f/u appt confirmed? Yes  Scheduled to see Neurology on 10/12/22 @ 10:45am. Are transportation arrangements needed? Yes provided patient with -[facility] [phone] If their condition worsens, is the pt aware to call PCP or go to the Emergency Dept.? Yes Was the patient provided with contact information for the PCP's office or ED? No Was to pt encouraged to call back with questions or concerns? Yes  RNCM provided patient with information about Case Management services. Patient declines CM at this time.  Estanislado Emms RN, BSN [place]  [employer]

## 2022-09-09 DIAGNOSIS — Z681 Body mass index (BMI) 19 or less, adult: Secondary | ICD-10-CM | POA: Diagnosis not present

## 2022-09-09 DIAGNOSIS — J0100 Acute maxillary sinusitis, unspecified: Secondary | ICD-10-CM | POA: Diagnosis not present

## 2022-10-12 ENCOUNTER — Ambulatory Visit: Payer: Self-pay | Admitting: Neurology

## 2022-11-14 ENCOUNTER — Ambulatory Visit: Payer: Medicaid Other | Admitting: Neurology

## 2022-11-29 DIAGNOSIS — Z681 Body mass index (BMI) 19 or less, adult: Secondary | ICD-10-CM | POA: Diagnosis not present

## 2022-11-29 DIAGNOSIS — R030 Elevated blood-pressure reading, without diagnosis of hypertension: Secondary | ICD-10-CM | POA: Diagnosis not present

## 2022-11-29 DIAGNOSIS — M545 Low back pain, unspecified: Secondary | ICD-10-CM | POA: Diagnosis not present

## 2022-12-21 ENCOUNTER — Telehealth: Payer: Medicaid Other | Admitting: Nurse Practitioner

## 2022-12-21 ENCOUNTER — Telehealth: Payer: Self-pay

## 2022-12-21 DIAGNOSIS — B9689 Other specified bacterial agents as the cause of diseases classified elsewhere: Secondary | ICD-10-CM | POA: Diagnosis not present

## 2022-12-21 DIAGNOSIS — N3000 Acute cystitis without hematuria: Secondary | ICD-10-CM

## 2022-12-21 DIAGNOSIS — N760 Acute vaginitis: Secondary | ICD-10-CM

## 2022-12-21 MED ORDER — METRONIDAZOLE 0.75 % VA GEL: 1.0000 | Freq: Every day | VAGINAL | 0 refills | Status: AC

## 2022-12-21 MED ORDER — NITROFURANTOIN MONOHYD MACRO 100 MG PO CAPS: 100.0000 mg | ORAL_CAPSULE | Freq: Two times a day (BID) | ORAL | 0 refills | Status: AC

## 2022-12-21 NOTE — Telephone Encounter (Signed)
Patient stated that she thinks she has a UTI and BV and wanted to know if she can come in today for a self swab

## 2022-12-21 NOTE — Progress Notes (Signed)
E-Visit for Vaginal Symptoms & UTI  We are sorry that you are not feeling well. Here is how we plan to help! Based on what you shared with me it looks like you: May have a vaginosis due to bacteria  Vaginosis is an inflammation of the vagina that can result in discharge, itching and pain. The cause is usually a change in the normal balance of vaginal bacteria or an infection. Vaginosis can also result from reduced estrogen levels after menopause.  The most common causes of vaginosis are:   Bacterial vaginosis which results from an overgrowth of one on several organisms that are normally present in your vagina.   Yeast infections which are caused by a naturally occurring fungus called candida.   Vaginal atrophy (atrophic vaginosis) which results from the thinning of the vagina from reduced estrogen levels after menopause.   Trichomoniasis which is caused by a parasite and is commonly transmitted by sexual intercourse.  Factors that increase your risk of developing vaginosis include: Medications, such as antibiotics and steroids Uncontrolled diabetes Use of hygiene products such as bubble bath, vaginal spray or vaginal deodorant Douching Wearing damp or tight-fitting clothing Using an intrauterine device (IUD) for birth control Hormonal changes, such as those associated with pregnancy, birth control pills or menopause Sexual activity Having a sexually transmitted infection  Your treatment plan is: Mterogel (Metronidazole) One applicator full each night for 7 nights   Be sure to take all of the medication as directed. Stop taking any medication if you develop a rash, tongue swelling or shortness of breath. Mothers who are breast feeding should consider pumping and discarding their breast milk while on these antibiotics. However, there is no consensus that infant exposure at these doses would be harmful.  Remember that medication creams can weaken latex condoms. Marland Kitchen   HOME CARE:  Good  hygiene may prevent some types of vaginosis from recurring and may relieve some symptoms:  Avoid baths, hot tubs and whirlpool spas. Rinse soap from your outer genital area after a shower, and dry the area well to prevent irritation. Don't use scented or harsh soaps, such as those with deodorant or antibacterial action. Avoid irritants. These include scented tampons and pads. Wipe from front to back after using the toilet. Doing so avoids spreading fecal bacteria to your vagina.  Other things that may help prevent vaginosis include:  Don't douche. Your vagina doesn't require cleansing other than normal bathing. Repetitive douching disrupts the normal organisms that reside in the vagina and can actually increase your risk of vaginal infection. Douching won't clear up a vaginal infection. Use a latex condom. Both female and female latex condoms may help you avoid infections spread by sexual contact. Wear cotton underwear. Also wear pantyhose with a cotton crotch. If you feel comfortable without it, skip wearing underwear to bed. Yeast thrives in [employer] Your symptoms should improve in the next day or two.  GET HELP RIGHT AWAY IF:  You have pain in your lower abdomen ( pelvic area or over your ovaries) You develop nausea or vomiting You develop a fever Your discharge changes or worsens You have persistent pain with intercourse You develop shortness of breath, a rapid pulse, or you faint.  These symptoms could be signs of problems or infections that need to be evaluated by a medical provider now.  MAKE SURE YOU   Understand these instructions. Will watch your condition. Will get help right away if you are not doing well or get worse.  E-Visit for Urinary Problems  We are sorry that you are not feeling well.  Here is how we plan to help!  Based on what you shared with me it looks like you most likely have a simple urinary tract infection.  A UTI (Urinary Tract Infection) is a  bacterial infection of the bladder.  Most cases of urinary tract infections are simple to treat but a key part of your care is to encourage you to drink plenty of fluids and watch your symptoms carefully.  I have prescribed MacroBid 100 mg twice a day for 5 days.  Your symptoms should gradually improve. Call us if the burning in your urine worsens, you develop worsening fever, back pain or pelvic pain or if your symptoms do not resolve after completing the antibiotic.  Urinary tract infections can be prevented by drinking plenty of water to keep your body hydrated.  Also be sure when you wipe, wipe from front to back and don't hold it in!  If possible, empty your bladder every 4 hours.  HOME CARE Drink plenty of fluids Compete the full course of the antibiotics even if the symptoms resolve Remember, when you need to go.go. Holding in your urine can increase the likelihood of getting a UTI! GET HELP RIGHT AWAY IF: You cannot urinate You get a high fever Worsening back pain occurs You see blood in your urine You feel sick to your stomach or throw up You feel like you are going to pass out  MAKE SURE YOU  Understand these instructions. Will watch your condition. Will get help right away if you are not doing well or get worse.   Thank you for choosing an e-visit.  Your e-visit answers were reviewed by a board certified advanced clinical practitioner to complete your personal care plan. Depending upon the condition, your plan could have included both over the counter or prescription medications.  Please review your pharmacy choice. Make sure the pharmacy is open so you can pick up prescription now. If there is a problem, you may contact your provider through [employer] and have the prescription routed to another pharmacy.  Your safety is important to [place]. If you have drug allergies check your prescription carefully.   For the next 24 hours you can use MyChart to ask questions about  today's visit, request a non-urgent call back, or ask for a work or school excuse. You will get an email in the next two days asking about your experience. I hope that your e-visit has been valuable and will speed your recovery.   Meds ordered this encounter  Medications   metroNIDAZOLE (METROGEL) 0.75 % vaginal gel    Sig: Place 1 Applicatorful vaginally at bedtime for 7 days.    Dispense:  70 g    Refill:  0   nitrofurantoin, macrocrystal-monohydrate, (MACROBID) 100 MG capsule    Sig: Take 1 capsule (100 mg total) by mouth 2 (two) times daily for 5 days.    Dispense:  10 capsule    Refill:  0    I spent approximately 7 minutes reviewing the patient's history, current symptoms and coordinating their plan of care today.

## 2023-01-02 ENCOUNTER — Ambulatory Visit: Payer: Medicaid Other | Admitting: Family Medicine

## 2023-01-10 DIAGNOSIS — R300 Dysuria: Secondary | ICD-10-CM | POA: Diagnosis not present

## 2023-01-10 DIAGNOSIS — N9489 Other specified conditions associated with female genital organs and menstrual cycle: Secondary | ICD-10-CM | POA: Diagnosis not present

## 2023-01-10 DIAGNOSIS — Z681 Body mass index (BMI) 19 or less, adult: Secondary | ICD-10-CM | POA: Diagnosis not present

## 2023-01-13 ENCOUNTER — Telehealth: Payer: Medicaid Other | Admitting: Physician Assistant

## 2023-01-13 DIAGNOSIS — N390 Urinary tract infection, site not specified: Secondary | ICD-10-CM

## 2023-01-13 NOTE — Progress Notes (Signed)
Because this is a recurrent issue with a recent infection have been treated at the beginning of the month, I feel your condition warrants further evaluation and I recommend that you be seen in a face to face visit. When UTIs are more recurrent, or frequently recur after treatment, they are then considered to be a complicated UTI. It is not safe to treat any complicated UTI via a virtual platform.   NOTE: There will be NO CHARGE for this eVisit   If you are having a true medical emergency please call 911.      For an urgent face to face visit, [facility] has eight urgent care centers for your convenience:   NEW!! [facility] Health Urgent Care Center at [facility] Get Driving Directions [phone] [address] [place], [address]    [facility] Health Urgent Care Center at [facility] Get Driving Directions [phone] [address] [place], [place] [address]   [facility] Urgent Care Center [facility]) Get Driving Directions [phone] [address] [place], [place] [address]  [facility]) Get Driving Directions [phone] [address] [place],  [place]  [address]  [facility] at [employer]  [phone] [address] [place],  [place] [address]   [facility] Health Urgent Care at [facility] Get Driving Directions [phone] [address], [address] [place], [place] [address]   [facility] Health Urgent Care at [facility] Get Driving Directions  [phone] [address] [place], [place] [address]   [facility] Health Urgent Care at [facility] Directions [phone] [address], [address] [place], [place] [address]  Your MyChart E-visit questionnaire answers were reviewed by a board certified advanced clinical practitioner to complete your personal care plan based on your  specific symptoms.  Thank you for using e-Visits.    I have spent 5 minutes in review of e-visit questionnaire, review and updating patient chart, medical decision making and response to patient.   Margaretann Loveless, PA-C

## 2023-01-16 ENCOUNTER — Ambulatory Visit: Payer: Medicaid Other | Admitting: Family Medicine

## 2023-01-16 ENCOUNTER — Encounter: Payer: Self-pay | Admitting: Family Medicine

## 2023-01-16 VITALS — BP 104/62 | HR 79 | Temp 98.4°F | Ht 65.0 in | Wt 113.0 lb

## 2023-01-16 DIAGNOSIS — A599 Trichomoniasis, unspecified: Secondary | ICD-10-CM

## 2023-01-16 DIAGNOSIS — Z87898 Personal history of other specified conditions: Secondary | ICD-10-CM | POA: Diagnosis not present

## 2023-01-16 DIAGNOSIS — Z8759 Personal history of other complications of pregnancy, childbirth and the puerperium: Secondary | ICD-10-CM | POA: Insufficient documentation

## 2023-01-16 NOTE — Assessment & Plan Note (Signed)
Advised to complete course of Flagyl

## 2023-01-16 NOTE — Progress Notes (Signed)
Subjective:  Patient ID: Katelyn Martinez, female    DOB: 1996-07-18  Age: 27 y.o. MRN: [id number]  CC: Chief Complaint  Patient presents with   Establish Care    HPI:  27 year old female presents to establish care.  Patient reports she is overall doing well.  However, she was recently diagnosed with trichomonas and this has been upsetting for her.  Patient has had prior seizure but is no longer on Keppra.  Using IUD for contraception.    Patient has no issues or concerns today.  Patient Active Problem List   Diagnosis Date Noted   History of gestational hypertension 01/16/2023   Trichomonas infection 01/16/2023   History of seizure 01/16/2023   IUD (intrauterine device) in place 07/09/2022   History of intracranial hemorrhage, 2016, due to fall(at [place]) 07/08/2022   Anxiety and depression 05/15/2020   Asthma 12/24/2014    Social Hx   Social History   Socioeconomic History   Marital status: Single    Spouse name: Not on file   Number of children: 1   Years of education: Not on file   Highest education level: Not on file  Occupational History   Not on file  Tobacco Use   Smoking status: Former    Types: Cigarettes    Passive exposure: Yes   Smokeless tobacco: Never  Vaping Use   Vaping Use: Never used  Substance and Sexual Activity   Alcohol use: Not Currently    Comment: socially   Drug use: Not Currently    Frequency: 7.0 times per week    Types: Marijuana   Sexual activity: Yes    Birth control/protection: None  Other Topics Concern   Not on file  Social History Narrative   Not on file   Social Determinants of Health   Financial Resource Strain: Low Risk  (04/20/2022)   Overall Financial Resource Strain (CARDIA)    Difficulty of Paying Living Expenses: Not hard at all  Food Insecurity: No Food Insecurity (07/09/2022)   Hunger Vital Sign    Worried About Running Out of Food in the Last Year: Never true    Ran Out of Food in the Last  Year: Never true  Transportation Needs: No Transportation Needs (08/30/2022)   PRAPARE - Administrator, Civil Service (Medical): No    Lack of Transportation (Non-Medical): No  Physical Activity: Inactive (04/20/2022)   Exercise Vital Sign    Days of Exercise per Week: 0 days    Minutes of Exercise per Session: 0 min  Stress: Stress Concern Present (04/20/2022)   [employer] of Occupational Health - Occupational Stress Questionnaire    Feeling of Stress : To some extent  Social Connections: Socially Isolated (04/20/2022)   Social Connection and Isolation Panel [NHANES]    Frequency of Communication with Friends and Family: More than three times a week    Frequency of Social Gatherings with Friends and Family: Once a week    Attends Religious Services: Never    Database administrator or Organizations: No    Attends Engineer, structural: Never    Marital Status: Never married    Review of Systems Per HPI  Objective:  BP 104/62   Pulse 79   Temp 98.4 F (36.9 C)   Ht 5\' 5"  (1.651 m)   Wt 113 lb (51.3 kg)   LMP 01/14/2023 (Exact Date)   SpO2 96%   BMI 18.80 kg/m  01/16/2023    2:53 PM 08/27/2022   11:30 AM 08/27/2022   10:30 AM  BP/Weight  Systolic BP 104 109 93  Diastolic BP 62 76 74  Wt. (Lbs) 113    BMI 18.8 kg/m2      Physical Exam Vitals and nursing note reviewed.  Constitutional:      General: She is not in acute distress.    Appearance: Normal appearance.  HENT:     Head: Normocephalic and atraumatic.  Eyes:     General:        Right eye: No discharge.        Left eye: No discharge.     Conjunctiva/sclera: Conjunctivae normal.  Cardiovascular:     Rate and Rhythm: Normal rate and regular rhythm.  Pulmonary:     Effort: Pulmonary effort is normal.     Breath sounds: Normal breath sounds. No wheezing, rhonchi or rales.  Neurological:     Mental Status: She is alert.  Psychiatric:        Mood and Affect: Mood normal.         Behavior: Behavior normal.     Lab Results  Component Value Date   WBC 7.0 08/27/2022   HGB 12.4 08/27/2022   HCT 40.6 08/27/2022   PLT 252 08/27/2022   GLUCOSE 88 08/27/2022   ALT 24 08/27/2022   AST 27 08/27/2022   NA 140 08/27/2022   K 3.5 08/27/2022   CL 113 (H) 08/27/2022   CREATININE 0.89 08/27/2022   BUN 16 08/27/2022   CO2 21 (L) 08/27/2022   TSH 0.496 12/18/2020   HGBA1C 5.2 01/17/2022     Assessment & Plan:   Problem List Items Addressed This Visit       Other   History of seizure    On no meds at this time.  Will monitor.      Trichomonas infection - Primary    Advised to complete course of Flagyl      Relevant Medications   metroNIDAZOLE (FLAGYL) 500 MG tablet    Follow-up: Annually  Everlene Other DO [facility]

## 2023-01-16 NOTE — Patient Instructions (Signed)
Follow up annually. ? ?Take care ? ?Dr. Natale Thoma  ?

## 2023-01-16 NOTE — Assessment & Plan Note (Signed)
On no meds at this time.  Will monitor.

## 2023-01-25 ENCOUNTER — Other Ambulatory Visit: Payer: Self-pay | Admitting: Adult Health

## 2023-01-25 ENCOUNTER — Other Ambulatory Visit (INDEPENDENT_AMBULATORY_CARE_PROVIDER_SITE_OTHER): Payer: Medicaid Other

## 2023-01-25 ENCOUNTER — Other Ambulatory Visit: Payer: Self-pay | Admitting: Family Medicine

## 2023-01-25 ENCOUNTER — Telehealth: Payer: Medicaid Other | Admitting: Nurse Practitioner

## 2023-01-25 ENCOUNTER — Other Ambulatory Visit (HOSPITAL_COMMUNITY)
Admission: RE | Admit: 2023-01-25 | Discharge: 2023-01-25 | Disposition: A | Discharge status: Home / Self Care | Payer: Medicaid Other | Source: Ambulatory Visit | Attending: Obstetrics & Gynecology | Admitting: Obstetrics & Gynecology

## 2023-01-25 DIAGNOSIS — R300 Dysuria: Secondary | ICD-10-CM | POA: Insufficient documentation

## 2023-01-25 DIAGNOSIS — Z8619 Personal history of other infectious and parasitic diseases: Secondary | ICD-10-CM

## 2023-01-25 DIAGNOSIS — M545 Low back pain, unspecified: Secondary | ICD-10-CM | POA: Insufficient documentation

## 2023-01-25 DIAGNOSIS — N898 Other specified noninflammatory disorders of vagina: Secondary | ICD-10-CM

## 2023-01-25 LAB — POCT URINALYSIS DIPSTICK OB
Glucose, UA: NEGATIVE
Ketones, UA: NEGATIVE
Nitrite, UA: POSITIVE
POC,PROTEIN,UA: NEGATIVE

## 2023-01-25 MED ORDER — SULFAMETHOXAZOLE-TRIMETHOPRIM 800-160 MG PO TABS: 1.0000 | ORAL_TABLET | Freq: Two times a day (BID) | ORAL | 0 refills | Status: DC

## 2023-01-25 NOTE — Progress Notes (Signed)
   NURSE VISIT- UTI SYMPTOMS/STD screen  SUBJECTIVE:  Katelyn Martinez is a 27 y.o. [phone] female here for UTI symptoms. She is a GYN patient. She reports dysuria, flank pain bilaterally, hematuria, and lower abdominal pain.  Also requesting CV swab.  Went to [place] two weeks ago and was told she had trichomonas and medication was sent in. She was then called back four days later and told she did not have it.  Recently found out partner cheated as the reason she went to get tested.    OBJECTIVE:  LMP 01/14/2023 (Exact Date)   Appears well, in no apparent distress  Results for orders placed or performed in visit on 01/25/23 (from the past 24 hour(s))  POC Urinalysis Dipstick OB   Collection Time: 01/25/23  4:15 PM  Result Value Ref Range   Color, UA     Clarity, UA     Glucose, UA Negative Negative   Bilirubin, UA     Ketones, UA neg    Spec Grav, UA     Blood, UA large    pH, UA     POC,PROTEIN,UA Negative Negative, Trace, Small (1+), Moderate (2+), Large (3+), 4+   Urobilinogen, UA     Nitrite, UA positive    Leukocytes, UA Small (1+) (A) Negative   Appearance     Odor      ASSESSMENT: GYN patient with UTI symptoms and positive nitrites  PLAN: Note routed to Cyril Mourning, AGNP   Rx sent by provider today: Yes Urine culture sent Call or return to clinic prn if these symptoms worsen or fail to improve as anticipated. Follow-up: as needed   Jobe Marker  01/25/2023 4:17 PM

## 2023-01-25 NOTE — Progress Notes (Signed)
Katelyn Martinez,  We recommend you follow up with your primary care provider or OBGYN for vaginal exam due to recent diagnosis and treatment and to assure the next steps  I feel your condition warrants further evaluation and I recommend that you be seen for a face to face visit.  Please contact your primary care physician practice to be seen. Many offices offer virtual options to be seen via video if you are not comfortable going in person to a medical facility at this time.  NOTE: You will NOT be charged for this eVisit.  If you do not have a PCP, [facility] offers a free physician referral service available at [phone]. Our trained staff has the experience, knowledge and resources to put you in touch with a physician who is right for you.    If you are having a true medical emergency please call 911.   Your e-visit answers were reviewed by a board certified advanced clinical practitioner to complete your personal care plan.  Thank you for using e-Visits.

## 2023-01-25 NOTE — Progress Notes (Signed)
+  nitrates on urine will rx septra ds  

## 2023-01-26 LAB — URINALYSIS, ROUTINE W REFLEX MICROSCOPIC
Bilirubin, UA: NEGATIVE
Glucose, UA: NEGATIVE
Ketones, UA: NEGATIVE
Nitrite, UA: POSITIVE — AB
Protein,UA: NEGATIVE
Specific Gravity, UA: 1.015 (ref 1.005–1.030)
Urobilinogen, Ur: 1 mg/dL (ref 0.2–1.0)
pH, UA: 6 (ref 5.0–7.5)

## 2023-01-26 LAB — MICROSCOPIC EXAMINATION: Casts: NONE SEEN /lpf

## 2023-01-27 LAB — CERVICOVAGINAL ANCILLARY ONLY
Bacterial Vaginitis (gardnerella): NEGATIVE
Candida Glabrata: NEGATIVE
Candida Vaginitis: NEGATIVE
Chlamydia: NEGATIVE
Comment: NEGATIVE
Comment: NEGATIVE
Comment: NEGATIVE
Comment: NEGATIVE
Comment: NEGATIVE
Comment: NORMAL
Neisseria Gonorrhea: NEGATIVE
Trichomonas: NEGATIVE

## 2023-01-31 ENCOUNTER — Other Ambulatory Visit: Payer: Self-pay | Admitting: Adult Health

## 2023-01-31 LAB — URINE CULTURE

## 2023-01-31 MED ORDER — NITROFURANTOIN MONOHYD MACRO 100 MG PO CAPS: 100.0000 mg | ORAL_CAPSULE | Freq: Two times a day (BID) | ORAL | 0 refills | Status: DC

## 2023-01-31 NOTE — Progress Notes (Signed)
Rx macrobid  

## 2023-02-06 ENCOUNTER — Telehealth: Payer: Medicaid Other | Admitting: Nurse Practitioner

## 2023-02-06 DIAGNOSIS — B3731 Acute candidiasis of vulva and vagina: Secondary | ICD-10-CM

## 2023-02-06 MED ORDER — FLUCONAZOLE 150 MG PO TABS: 150.0000 mg | ORAL_TABLET | Freq: Once | ORAL | 0 refills | Status: AC

## 2023-02-06 NOTE — Progress Notes (Signed)

## 2023-02-07 NOTE — Telephone Encounter (Signed)
Patient was seen by E-Visit for vaginal discharge.

## 2023-02-20 ENCOUNTER — Telehealth: Payer: Medicaid Other | Admitting: Nurse Practitioner

## 2023-02-20 DIAGNOSIS — B852 Pediculosis, unspecified: Secondary | ICD-10-CM

## 2023-02-20 MED ORDER — NIX CREME RINSE 1 % EX LIQD: 1.0000 | Freq: Once | CUTANEOUS | 0 refills | Status: AC

## 2023-02-20 MED ORDER — IVERMECTIN 0.5 % EX LOTN: 1.0000 | TOPICAL_LOTION | Freq: Once | CUTANEOUS | 0 refills | Status: AC

## 2023-02-20 NOTE — Progress Notes (Signed)
E-Visit for Lice  We are sorry that you are not feeling well. Here is how we plan to help!  Based on what you have shared with me it looks like you have head lice.  Lice are very tiny insects that like to live on hair.  An infection with head lice is very common in school-age children, but anyone can get lice.  Head lice do not live on pets and cannot jump, fly or walk on the ground.  But they easily pass from person to person through close contact and on clothes, bed linens, brushes, combs, hats and toys.  Head lice infections are not dangerous but they can be difficult to treat.  Lice are contagious and should be treated right away to stop infection from spreading.   I recommend that you use: Over-the-counter Nix as directed per box instructions.  Nix is most effecive when you use the nit comb as directed.  Meds ordered this encounter  Medications   permethrin (NIX CREME RINSE) 1 % external liquid    Sig: Apply 1 Application topically once for 1 dose. Shampoo, rinse and towel dry hair, saturate hair and scalp with permethrin. Rinse after 10 min; repeat in 1 week if needed    Dispense:  120 mL    Refill:  0    HOME CARE:  You should treat all members in your household. Wash towels, clothes, bed linens, cloth toys, hats and other personal items in hot water and dry on high heat. Wash all combs and brushes in very hot soapy water or throw them out and buy new ones. Vacuum floors and furniture and throw out the bag afterwards. Do not go to work or school until the morning after your treatment for lice. If family members are also infected, you may need to notify your child's day care or school so that other children can be checked.  GET HELP RIGHT AWAY IF:  Your treatment does not get rid of lice. You develop sores on your scalp that become infected, worsen or do not heal. You or your child become itchy or are scratching in areas other than the scalp.  MAKE SURE YOU:  Understand these  instructions. Will watch your condition. Will get help right away if you are not doing well or get worse.  Your e-visit answers were reviewed by a board certified advanced clinical practitioner to complete your personal care plan.  Depending upon the condition, your plan could have included both over the counter or prescription medications.    Please review your pharmacy choice.  Make sure the pharmacy is open so you can pick up prescription now.   If there is a problem, you may contact your provider through [employer] and have the prescription routed to another pharmacy. Your safety is important to [place].  If you have drug allergies check your prescription carefully.    For the next 24 hours you can use MyChart to ask questions about today's visit, request a non-urgent call back, or ask for a work or school excuse.  You will get an email in the next 2 days asking about your experience.  I hope that your e-visit has been valuable and will speed your recovery.   I spent approximately 5 minutes reviewing the patient's history, current symptoms and coordinating their care today.

## 2023-02-20 NOTE — Addendum Note (Signed)
Addended by: Viviano Simas E on: 02/20/2023 04:18 PM   Modules accepted: Orders

## 2023-03-03 ENCOUNTER — Telehealth: Payer: Medicaid Other | Admitting: Physician Assistant

## 2023-03-03 DIAGNOSIS — N760 Acute vaginitis: Secondary | ICD-10-CM | POA: Diagnosis not present

## 2023-03-03 DIAGNOSIS — N390 Urinary tract infection, site not specified: Secondary | ICD-10-CM

## 2023-03-03 DIAGNOSIS — J069 Acute upper respiratory infection, unspecified: Secondary | ICD-10-CM | POA: Diagnosis not present

## 2023-03-03 DIAGNOSIS — Z681 Body mass index (BMI) 19 or less, adult: Secondary | ICD-10-CM | POA: Diagnosis not present

## 2023-03-03 DIAGNOSIS — R300 Dysuria: Secondary | ICD-10-CM | POA: Diagnosis not present

## 2023-03-03 NOTE — Progress Notes (Signed)
Because of ongoing symptoms despite treatment and need for urine culture and vaginal swabbing, I feel your condition warrants further evaluation and I recommend that you be seen in a face to face visit.   NOTE: There will be NO CHARGE for this eVisit   If you are having a true medical emergency please call 911.      For an urgent face to face visit, [facility] has eight urgent care centers for your convenience:   NEW!! [facility] Health Urgent Care Center at [facility] Get Driving Directions [phone] [address] [place], [address]    [facility] Health Urgent Care Center at [facility] Get Driving Directions [phone] [address] [place], [place] [address]   [facility]gent Care Center [facility]) Get Driving Directions [phone] [address] [place], [place] [address]  [facility] Health Urgent Care Center [facility]) Get Driving Directions [phone] [address] [place],  [place]  [address]  [facility] Health Urgent Care Center [facility] - at [employer]  [phone] [address] [place],  [place] [address]   [facility] Health Urgent Care at [facility] Get Driving Directions [phone] [address] [place], [place] [address]   [facility] Health Urgent Care at [facility] Get Driving Directions  [phone] [address] [place], [place] [address]   [facility] Health Urgent Care at [facility] Directions [phone] [address] [place], [place] [address]  Your MyChart E-visit questionnaire answers were reviewed by a board certified advanced clinical practitioner to complete your personal care plan based on your specific symptoms.  Thank you for using e-Visits.

## 2023-04-11 ENCOUNTER — Encounter: Payer: Self-pay | Admitting: Women's Health

## 2023-04-11 ENCOUNTER — Other Ambulatory Visit: Payer: Self-pay | Admitting: Adult Health

## 2023-04-11 MED ORDER — FLUCONAZOLE 150 MG PO TABS: ORAL_TABLET | ORAL | 1 refills | Status: DC

## 2023-04-11 NOTE — Progress Notes (Signed)
Rx sent for diflucan

## 2023-04-20 ENCOUNTER — Ambulatory Visit: Payer: Medicaid Other | Admitting: Obstetrics and Gynecology

## 2023-04-27 ENCOUNTER — Telehealth: Payer: Medicaid Other | Admitting: Physician Assistant

## 2023-04-27 DIAGNOSIS — N760 Acute vaginitis: Secondary | ICD-10-CM | POA: Diagnosis not present

## 2023-04-27 DIAGNOSIS — B9689 Other specified bacterial agents as the cause of diseases classified elsewhere: Secondary | ICD-10-CM | POA: Diagnosis not present

## 2023-04-27 MED ORDER — METRONIDAZOLE 500 MG PO TABS: 500.0000 mg | ORAL_TABLET | Freq: Two times a day (BID) | ORAL | 0 refills | Status: AC

## 2023-04-27 NOTE — Progress Notes (Signed)
E-Visit for Vaginal Symptoms  We are sorry that you are not feeling well. Here is how we plan to help! Based on what you shared with me it looks like you: May have a vaginosis due to bacteria  Vaginosis is an inflammation of the vagina that can result in discharge, itching and pain. The cause is usually a change in the normal balance of vaginal bacteria or an infection. Vaginosis can also result from reduced estrogen levels after menopause.  The most common causes of vaginosis are:   Bacterial vaginosis which results from an overgrowth of one on several organisms that are normally present in your vagina.   Yeast infections which are caused by a naturally occurring fungus called candida.   Vaginal atrophy (atrophic vaginosis) which results from the thinning of the vagina from reduced estrogen levels after menopause.   Trichomoniasis which is caused by a parasite and is commonly transmitted by sexual intercourse.  Factors that increase your risk of developing vaginosis include: Medications, such as antibiotics and steroids Uncontrolled diabetes Use of hygiene products such as bubble bath, vaginal spray or vaginal deodorant Douching Wearing damp or tight-fitting clothing Using an intrauterine device (IUD) for birth control Hormonal changes, such as those associated with pregnancy, birth control pills or menopause Sexual activity Having a sexually transmitted infection  Your treatment plan is Metronidazole or Flagyl 500mg twice a day for 7 days.  I have electronically sent this prescription into the pharmacy that you have chosen.  Be sure to take all of the medication as directed. Stop taking any medication if you develop a rash, tongue swelling or shortness of breath. Mothers who are breast feeding should consider pumping and discarding their breast milk while on these antibiotics. However, there is no consensus that infant exposure at these doses would be harmful.  Remember that  medication creams can weaken latex condoms. .   HOME CARE:  Good hygiene may prevent some types of vaginosis from recurring and may relieve some symptoms:  Avoid baths, hot tubs and whirlpool spas. Rinse soap from your outer genital area after a shower, and dry the area well to prevent irritation. Don't use scented or harsh soaps, such as those with deodorant or antibacterial action. Avoid irritants. These include scented tampons and pads. Wipe from front to back after using the toilet. Doing so avoids spreading fecal bacteria to your vagina.  Other things that may help prevent vaginosis include:  Don't douche. Your vagina doesn't require cleansing other than normal bathing. Repetitive douching disrupts the normal organisms that reside in the vagina and can actually increase your risk of vaginal infection. Douching won't clear up a vaginal infection. Use a latex condom. Both female and female latex condoms may help you avoid infections spread by sexual contact. Wear cotton underwear. Also wear pantyhose with a cotton crotch. If you feel comfortable without it, skip wearing underwear to bed. Yeast thrives in moist environments Your symptoms should improve in the next day or two.  GET HELP RIGHT AWAY IF:  You have pain in your lower abdomen ( pelvic area or over your ovaries) You develop nausea or vomiting You develop a fever Your discharge changes or worsens You have persistent pain with intercourse You develop shortness of breath, a rapid pulse, or you faint.  These symptoms could be signs of problems or infections that need to be evaluated by a medical provider now.  MAKE SURE YOU   Understand these instructions. Will watch your condition. Will get help right   away if you are not doing well or get worse.  Thank you for choosing an e-visit.  Your e-visit answers were reviewed by a board certified advanced clinical practitioner to complete your personal care plan. Depending upon the  condition, your plan could have included both over the counter or prescription medications.  Please review your pharmacy choice. Make sure the pharmacy is open so you can pick up prescription now. If there is a problem, you may contact your provider through MyChart messaging and have the prescription routed to another pharmacy.  Your safety is important to us. If you have drug allergies check your prescription carefully.   For the next 24 hours you can use MyChart to ask questions about today's visit, request a non-urgent call back, or ask for a work or school excuse. You will get an email in the next two days asking about your experience. I hope that your e-visit has been valuable and will speed your recovery.  I have spent 5 minutes in review of e-visit questionnaire, review and updating patient chart, medical decision making and response to patient.   Cashay Manganelli M Emunah Texidor, PA-C  

## 2023-05-18 DIAGNOSIS — N760 Acute vaginitis: Secondary | ICD-10-CM | POA: Diagnosis not present

## 2023-05-18 DIAGNOSIS — Z6820 Body mass index (BMI) 20.0-20.9, adult: Secondary | ICD-10-CM | POA: Diagnosis not present

## 2023-05-24 ENCOUNTER — Ambulatory Visit: Payer: Medicaid Other | Admitting: Family Medicine

## 2023-06-14 ENCOUNTER — Other Ambulatory Visit (HOSPITAL_COMMUNITY)
Admission: RE | Admit: 2023-06-14 | Discharge: 2023-06-14 | Disposition: A | Discharge status: Home / Self Care | Payer: Medicaid Other | Source: Ambulatory Visit | Attending: Obstetrics & Gynecology | Admitting: Obstetrics & Gynecology

## 2023-06-14 ENCOUNTER — Other Ambulatory Visit (INDEPENDENT_AMBULATORY_CARE_PROVIDER_SITE_OTHER): Payer: Medicaid Other | Admitting: *Deleted

## 2023-06-14 DIAGNOSIS — Z113 Encounter for screening for infections with a predominantly sexual mode of transmission: Secondary | ICD-10-CM | POA: Insufficient documentation

## 2023-06-14 NOTE — Progress Notes (Signed)
   NURSE VISIT- VAGINITIS/STD/POC  SUBJECTIVE:  Katelyn Martinez is a 27 y.o. [phone] GYN patientfemale here for a vaginal swab for STD screen.  She reports the following symptoms: none for 0 days. Denies abnormal vaginal bleeding, significant pelvic pain, fever, or UTI symptoms.  OBJECTIVE:  There were no vitals taken for this visit.  Appears well, in no apparent distress  ASSESSMENT: Vaginal swab for STD screen  PLAN: Self-collected vaginal probe for Gonorrhea, Chlamydia, Trichomonas, Bacterial Vaginosis, Yeast sent to lab Treatment: to be determined once results are received Follow-up as needed if symptoms persist/worsen, or new symptoms develop  Annamarie Dawley  06/14/2023 3:22 PM

## 2023-06-15 LAB — CERVICOVAGINAL ANCILLARY ONLY
Bacterial Vaginitis (gardnerella): POSITIVE — AB
Candida Glabrata: NEGATIVE
Candida Vaginitis: POSITIVE — AB
Chlamydia: NEGATIVE
Comment: NEGATIVE
Comment: NEGATIVE
Comment: NEGATIVE
Comment: NEGATIVE
Comment: NEGATIVE
Comment: NORMAL
Neisseria Gonorrhea: NEGATIVE
Trichomonas: NEGATIVE

## 2023-06-15 LAB — RPR: RPR Ser Ql: NONREACTIVE

## 2023-06-15 LAB — HIV ANTIBODY (ROUTINE TESTING W REFLEX): HIV Screen 4th Generation wRfx: NONREACTIVE

## 2023-06-15 LAB — HEPATITIS B SURFACE ANTIGEN: Hepatitis B Surface Ag: NEGATIVE

## 2023-06-16 ENCOUNTER — Other Ambulatory Visit: Payer: Self-pay | Admitting: Adult Health

## 2023-06-16 MED ORDER — METRONIDAZOLE 500 MG PO TABS: 500.0000 mg | ORAL_TABLET | Freq: Two times a day (BID) | ORAL | 0 refills | Status: DC

## 2023-06-16 MED ORDER — FLUCONAZOLE 150 MG PO TABS: ORAL_TABLET | ORAL | 1 refills | Status: DC

## 2023-06-16 NOTE — Progress Notes (Signed)
+  BV and yeast on vaginal swab will rx flagyl and diflucan ,no sex or alcohol while taking

## 2023-07-03 ENCOUNTER — Other Ambulatory Visit: Payer: Self-pay | Admitting: Adult Health

## 2023-07-03 MED ORDER — METRONIDAZOLE 0.75 % VA GEL: 1.0000 | Freq: Every day | VAGINAL | 0 refills | Status: DC

## 2023-07-03 NOTE — Progress Notes (Signed)
Rx metrogel  

## 2023-07-12 ENCOUNTER — Ambulatory Visit (INDEPENDENT_AMBULATORY_CARE_PROVIDER_SITE_OTHER): Payer: Medicaid Other | Admitting: Women's Health

## 2023-07-12 ENCOUNTER — Encounter: Payer: Self-pay | Admitting: Women's Health

## 2023-07-12 ENCOUNTER — Other Ambulatory Visit (HOSPITAL_COMMUNITY)
Admission: RE | Admit: 2023-07-12 | Discharge: 2023-07-12 | Disposition: A | Discharge status: Home / Self Care | Payer: Medicaid Other | Source: Ambulatory Visit | Attending: Women's Health | Admitting: Women's Health

## 2023-07-12 VITALS — BP 102/67 | HR 71 | Ht 65.0 in | Wt 120.0 lb

## 2023-07-12 DIAGNOSIS — Z01419 Encounter for gynecological examination (general) (routine) without abnormal findings: Secondary | ICD-10-CM

## 2023-07-12 NOTE — Progress Notes (Signed)
f  WELL-WOMAN EXAMINATION Patient name: Katelyn Martinez MRN [id number]  Date of birth: Jan 24, 1996 Chief Complaint:   Gynecologic Exam  History of Present Illness:   Katelyn Martinez is a 27 y.o. [id number] Caucasian female being seen today for a routine well-woman exam.  Current complaints: bumps on mons, wants to make sure not something to be concerned about. Does shave  PCP: Adriana Simas      does not desire labs Patient's last menstrual period was 07/11/2023. The current method of family planning is IUD. Mirena inserted 07/09/22 Last pap 08/03/20. Results were: NILM w/ HRHPV not done. H/O abnormal pap: no Last mammogram: never. Results were: N/A. Family h/o breast cancer: no Last colonoscopy: never. Results were: N/A. Family h/o colorectal cancer: no     07/12/2023    2:36 PM 07/12/2023    2:35 PM 01/16/2023    3:09 PM 04/20/2022   10:07 AM 01/17/2022   10:43 AM  Depression screen PHQ 2/9  Decreased Interest 0 0 0 0 0  Down, Depressed, Hopeless 0 0 0 0 0  PHQ - 2 Score 0 0 0 0 0  Altered sleeping 0  0 2 1  Tired, decreased energy 0  0 1 2  Change in appetite 0  2 0 1  Feeling bad or failure about yourself  0  0 0 0  Trouble concentrating 0  0 0 0  Moving slowly or fidgety/restless 0  0 0 0  Suicidal thoughts 0  0 0 0  PHQ-9 Score 0  2 3 4   Difficult doing work/chores   Not difficult at all          07/12/2023    2:36 PM 01/16/2023    3:09 PM 04/20/2022   10:07 AM 01/17/2022   10:43 AM  GAD 7 : Generalized Anxiety Score  Nervous, Anxious, on Edge 0 1 0 2  Control/stop worrying 0 1 0 1  Worry too much - different things 0 1 0 1  Trouble relaxing 0 1 2 1   Restless 0 0 0 0  Easily annoyed or irritable 0 1 0 0  Afraid - awful might happen 0 1 0 0  Total GAD 7 Score 0 6 2 5   Anxiety Difficulty  Not difficult at all       Review of Systems:   Pertinent items are noted in HPI Denies any headaches, blurred vision, fatigue, shortness of breath, chest pain, abdominal pain, abnormal  vaginal discharge/itching/odor/irritation, problems with periods, bowel movements, urination, or intercourse unless otherwise stated above. Pertinent History Reviewed:  Reviewed past medical,surgical, social and family history.  Reviewed problem list, medications and allergies. Physical Assessment:   Vitals:   07/12/23 1432  BP: 102/67  Pulse: 71  Weight: 120 lb (54.4 kg)  Height: 5\' 5"  (1.651 m)  Body mass index is 19.97 kg/m.        Physical Examination:   General appearance - well appearing, and in no distress  Mental status - alert, oriented to person, place, and time  Psych:  She has a normal mood and affect  Skin - warm and dry, normal color, no suspicious lesions noted  Chest - effort normal, all lung fields clear to auscultation bilaterally  Heart - normal rate and regular rhythm  Neck:  midline trachea, no thyromegaly or nodules  Breasts - breasts appear normal, no suspicious masses, no skin or nipple changes or  axillary nodes  Abdomen - soft, nontender, nondistended, no masses or organomegaly  Pelvic - VULVA: normal appearing vulva with no masses, tenderness or lesion. No bumps visible on mons. Some skin tags Rt lower buttocks. VAGINA: normal appearing vagina with normal color and discharge, no lesions  CERVIX: normal appearing cervix without discharge or lesions, no CMT, IUD strings visible  Thin prep pap is done w/ HR HPV cotesting  UTERUS: uterus is felt to be normal size, shape, consistency and nontender   ADNEXA: No adnexal masses or tenderness noted.  Extremities:  No swelling or varicosities noted  Chaperone: Faith Rogue    No results found for this or any previous visit (from the past 24 hour(s)).  Assessment & Plan:  1) Well-Woman Exam  Labs/procedures today: pap  Mammogram: @ 26yo, or sooner if problems Colonoscopy: @ 26yo, or sooner if problems  No orders of the defined types were placed in this encounter.   Meds: No orders of the defined types  were placed in this encounter.   Follow-up: Return in about 1 year (around 07/11/2024) for Physical.  Cheral Marker CNM, WHNP-BC 07/12/2023 3:07 PM

## 2023-07-19 LAB — CYTOLOGY - PAP
Chlamydia: NEGATIVE
Comment: NEGATIVE
Comment: NEGATIVE
Comment: NEGATIVE
Comment: NEGATIVE
Comment: NORMAL
Diagnosis: NEGATIVE
HPV 16: NEGATIVE
HPV 18 / 45: NEGATIVE
High risk HPV: POSITIVE — AB
Neisseria Gonorrhea: NEGATIVE

## 2023-07-20 ENCOUNTER — Encounter: Payer: Self-pay | Admitting: Women's Health

## 2023-07-20 ENCOUNTER — Encounter: Payer: Self-pay | Admitting: Adult Health

## 2023-07-20 ENCOUNTER — Telehealth: Payer: Self-pay

## 2023-07-20 DIAGNOSIS — R87810 Cervical high risk human papillomavirus (HPV) DNA test positive: Secondary | ICD-10-CM | POA: Insufficient documentation

## 2023-07-20 NOTE — Telephone Encounter (Signed)
Pt saw her pap results and wanted clarification. I spoke with pt and explained her results. Advised to have pap rechecked in 1 year instead of 3 years. Pt voiced understanding. JSY

## 2023-07-20 NOTE — Telephone Encounter (Signed)
 Patient would like for a nurse to call her

## 2023-08-15 ENCOUNTER — Other Ambulatory Visit (HOSPITAL_COMMUNITY)
Admission: RE | Admit: 2023-08-15 | Discharge: 2023-08-15 | Disposition: A | Discharge status: Home / Self Care | Payer: Medicaid Other | Source: Ambulatory Visit | Attending: Family Medicine | Admitting: Family Medicine

## 2023-08-15 ENCOUNTER — Ambulatory Visit: Payer: Medicaid Other | Admitting: Family Medicine

## 2023-08-15 VITALS — BP 105/69 | HR 77 | Temp 98.6°F | Ht 65.0 in | Wt 119.6 lb

## 2023-08-15 DIAGNOSIS — N898 Other specified noninflammatory disorders of vagina: Secondary | ICD-10-CM | POA: Insufficient documentation

## 2023-08-15 DIAGNOSIS — J988 Other specified respiratory disorders: Secondary | ICD-10-CM | POA: Insufficient documentation

## 2023-08-15 DIAGNOSIS — N3000 Acute cystitis without hematuria: Secondary | ICD-10-CM

## 2023-08-15 DIAGNOSIS — Z87898 Personal history of other specified conditions: Secondary | ICD-10-CM

## 2023-08-15 DIAGNOSIS — R300 Dysuria: Secondary | ICD-10-CM | POA: Diagnosis not present

## 2023-08-15 DIAGNOSIS — N390 Urinary tract infection, site not specified: Secondary | ICD-10-CM | POA: Insufficient documentation

## 2023-08-15 LAB — POCT URINALYSIS DIP (CLINITEK)
Bilirubin, UA: NEGATIVE
Blood, UA: NEGATIVE
Glucose, UA: NEGATIVE mg/dL
Ketones, POC UA: NEGATIVE mg/dL
Nitrite, UA: POSITIVE — AB
POC PROTEIN,UA: 30 — AB
Spec Grav, UA: 1.01 (ref 1.010–1.025)
Urobilinogen, UA: 0.2 U/dL
pH, UA: 7.5 (ref 5.0–8.0)

## 2023-08-15 MED ORDER — CEFDINIR 300 MG PO CAPS: 300.0000 mg | ORAL_CAPSULE | Freq: Two times a day (BID) | ORAL | 0 refills | Status: DC

## 2023-08-15 MED ORDER — FLUCONAZOLE 150 MG PO TABS: 150.0000 mg | ORAL_TABLET | Freq: Once | ORAL | 0 refills | Status: AC

## 2023-08-15 NOTE — Patient Instructions (Signed)
Medication as prescribed.  Referral placed.  Awaiting swab results.

## 2023-08-15 NOTE — Assessment & Plan Note (Signed)
Awaiting on swab results.

## 2023-08-15 NOTE — Progress Notes (Signed)
Subjective:  Patient ID: Katelyn Martinez, female    DOB: 01/29/1996  Age: 27 y.o. MRN: [id number]  CC:  Multiple issues/concerns   HPI:  27 year old female presents with multiple issues.  Patient reports a 1 to 2-week history of respiratory symptoms.  Reports cough, congestion, headache.  Also reports body aches.  No documented fever.  Symptoms have continued to persist and have not improved.  Patient also reports concerns for UTI.  She reports 1 week history of dysuria and malodorous urine.  Some lower back pain.  No fever.  No flank pain.  Patient also reports ongoing vaginal discharge.  Has a history of vaginal infections and desires testing today.  Lastly, patient reports that she has a prior history of seizure.  She is not on any medication at this time.  Was previously placed on Keppra.  She states that she does not feel well on this medication and therefore stopped taking it.  No recent seizure activity.  Requesting to see neurology.  Patient Active Problem List   Diagnosis Date Noted   Respiratory infection 08/15/2023   Vaginal discharge 08/15/2023   UTI (urinary tract infection) 08/15/2023   Papanicolaou smear of cervix with positive high risk human papilloma virus (HPV) test 07/20/2023   History of gestational hypertension 01/16/2023   Trichomonas infection 01/16/2023   History of seizure 01/16/2023   IUD (intrauterine device) in place 07/09/2022   History of intracranial hemorrhage, 2016, due to fall(at [place]) 07/08/2022   Anxiety and depression 05/15/2020   Asthma 12/24/2014    Social Hx   Social History   Socioeconomic History   Marital status: Single    Spouse name: Not on file   Number of children: 1   Years of education: Not on file   Highest education level: Not on file  Occupational History   Not on file  Tobacco Use   Smoking status: Former    Types: Cigarettes    Passive exposure: Yes   Smokeless tobacco: Never  Vaping Use   Vaping  status: Never Used  Substance and Sexual Activity   Alcohol use: Not Currently    Comment: socially   Drug use: Not Currently    Frequency: 7.0 times per week    Types: Marijuana   Sexual activity: Yes    Birth control/protection: None  Other Topics Concern   Not on file  Social History Narrative   Not on file   Social Determinants of Health   Financial Resource Strain: Low Risk  (07/12/2023)   Overall Financial Resource Strain (CARDIA)    Difficulty of Paying Living Expenses: Not very hard  Food Insecurity: No Food Insecurity (07/12/2023)   Hunger Vital Sign    Worried About Running Out of Food in the Last Year: Never true    Ran Out of Food in the Last Year: Never true  Transportation Needs: No Transportation Needs (07/12/2023)   PRAPARE - Administrator, Civil Service (Medical): No    Lack of Transportation (Non-Medical): No  Physical Activity: Insufficiently Active (07/12/2023)   Exercise Vital Sign    Days of Exercise per Week: 2 days    Minutes of Exercise per Session: 60 min  Stress: No Stress Concern Present (07/12/2023)   [employer] of Occupational Health - Occupational Stress Questionnaire    Feeling of Stress : Not at all  Social Connections: Socially Isolated (07/12/2023)   Social Connection and Isolation Panel [NHANES]    Frequency of Communication with  Friends and Family: More than three times a week    Frequency of Social Gatherings with Friends and Family: More than three times a week    Attends Religious Services: Never    Diplomatic Services operational officer: No    Attends Engineer, structural: Never    Marital Status: Never married    Review of Systems Per HPI  Objective:  BP 105/69   Pulse 77   Temp 98.6 F (37 C)   Ht 5\' 5"  (1.651 m)   Wt 119 lb 9.6 oz (54.3 kg)   SpO2 97%   BMI 19.90 kg/m      08/15/2023    3:13 PM 07/12/2023    2:32 PM 01/16/2023    2:53 PM  BP/Weight  Systolic BP 105 102 104   Diastolic BP 69 67 62  Wt. (Lbs) 119.6 120 113  BMI 19.9 kg/m2 19.97 kg/m2 18.8 kg/m2    Physical Exam Vitals and nursing note reviewed.  Constitutional:      General: She is not in acute distress.    Appearance: Normal appearance.  HENT:     Head: Normocephalic and atraumatic.     Right Ear: Tympanic membrane normal.     Left Ear: Tympanic membrane normal.     Mouth/Throat:     Pharynx: Oropharynx is clear.  Cardiovascular:     Rate and Rhythm: Normal rate and regular rhythm.  Pulmonary:     Effort: Pulmonary effort is normal.     Breath sounds: Normal breath sounds.  Neurological:     Mental Status: She is alert.  Psychiatric:        Mood and Affect: Mood normal.        Behavior: Behavior normal.     Lab Results  Component Value Date   WBC 7.0 08/27/2022   HGB 12.4 08/27/2022   HCT 40.6 08/27/2022   PLT 252 08/27/2022   GLUCOSE 88 08/27/2022   ALT 24 08/27/2022   AST 27 08/27/2022   NA 140 08/27/2022   K 3.5 08/27/2022   CL 113 (H) 08/27/2022   CREATININE 0.89 08/27/2022   BUN 16 08/27/2022   CO2 21 (L) 08/27/2022   TSH 0.496 12/18/2020   HGBA1C 5.2 01/17/2022     Assessment & Plan:   Problem List Items Addressed This Visit       Respiratory   Respiratory infection - Primary    Patient placed on Omnicef for UTI.  Omnicef should cover respiratory pathogens if there is a bacterial source present.      Relevant Medications   cefdinir (OMNICEF) 300 MG capsule   fluconazole (DIFLUCAN) 150 MG tablet     Genitourinary   UTI (urinary tract infection)    UA consistent with UTI.  Sending culture.  Placing on Omnicef.      Relevant Medications   cefdinir (OMNICEF) 300 MG capsule   fluconazole (DIFLUCAN) 150 MG tablet     Other   Vaginal discharge    Awaiting on swab results.      Relevant Orders   Cervicovaginal ancillary only   POCT URINALYSIS DIP (CLINITEK) (Completed)   Urine Culture   History of seizure    No recent seizure activity.   Placing referral to neurology.      Relevant Orders   Ambulatory referral to Neurology    Meds ordered this encounter  Medications   cefdinir (OMNICEF) 300 MG capsule    Sig: Take 1 capsule (300 mg total)  by mouth 2 (two) times daily.    Dispense:  20 capsule    Refill:  0   fluconazole (DIFLUCAN) 150 MG tablet    Sig: Take 1 tablet (150 mg total) by mouth once for 1 dose. Repeat dose in 72 hours.    Dispense:  2 tablet    Refill:  0    Lola Czerwonka DO [facility]

## 2023-08-15 NOTE — Assessment & Plan Note (Signed)
UA consistent with UTI.  Sending culture.  Placing on Omnicef.

## 2023-08-15 NOTE — Assessment & Plan Note (Signed)
No recent seizure activity.  Placing referral to neurology.

## 2023-08-15 NOTE — Assessment & Plan Note (Signed)
Patient placed on Omnicef for UTI.  Omnicef should cover respiratory pathogens if there is a bacterial source present.

## 2023-08-18 ENCOUNTER — Other Ambulatory Visit: Payer: Self-pay | Admitting: Family Medicine

## 2023-08-18 LAB — CERVICOVAGINAL ANCILLARY ONLY
Bacterial Vaginitis (gardnerella): POSITIVE — AB
Candida Glabrata: NEGATIVE
Candida Vaginitis: NEGATIVE
Chlamydia: NEGATIVE
Comment: NEGATIVE
Comment: NEGATIVE
Comment: NEGATIVE
Comment: NEGATIVE
Comment: NEGATIVE
Comment: NORMAL
Neisseria Gonorrhea: NEGATIVE
Trichomonas: NEGATIVE

## 2023-08-18 MED ORDER — METRONIDAZOLE 500 MG PO TABS: 500.0000 mg | ORAL_TABLET | Freq: Two times a day (BID) | ORAL | 0 refills | Status: AC

## 2023-08-20 LAB — URINE CULTURE

## 2023-08-20 LAB — SPECIMEN STATUS REPORT

## 2023-08-31 ENCOUNTER — Encounter: Payer: Self-pay | Admitting: Family Medicine

## 2023-08-31 ENCOUNTER — Other Ambulatory Visit: Payer: Self-pay | Admitting: Family Medicine

## 2023-08-31 MED ORDER — CLINDAMYCIN HCL 300 MG PO CAPS: 300.0000 mg | ORAL_CAPSULE | Freq: Two times a day (BID) | ORAL | 0 refills | Status: DC

## 2023-09-03 ENCOUNTER — Emergency Department (HOSPITAL_COMMUNITY)
Admission: EM | Admit: 2023-09-03 | Discharge: 2023-09-03 | Disposition: A | Discharge status: Home / Self Care | Payer: Medicaid Other | Attending: Emergency Medicine | Admitting: Emergency Medicine

## 2023-09-03 ENCOUNTER — Encounter (HOSPITAL_COMMUNITY): Payer: Self-pay

## 2023-09-03 ENCOUNTER — Other Ambulatory Visit: Payer: Self-pay

## 2023-09-03 DIAGNOSIS — R9431 Abnormal electrocardiogram [ECG] [EKG]: Secondary | ICD-10-CM | POA: Diagnosis not present

## 2023-09-03 DIAGNOSIS — R569 Unspecified convulsions: Secondary | ICD-10-CM | POA: Insufficient documentation

## 2023-09-03 LAB — BASIC METABOLIC PANEL
Anion gap: 8 (ref 5–15)
BUN: 16 mg/dL (ref 6–20)
CO2: 23 mmol/L (ref 22–32)
Calcium: 9.5 mg/dL (ref 8.9–10.3)
Chloride: 108 mmol/L (ref 98–111)
Creatinine, Ser: 0.88 mg/dL (ref 0.44–1.00)
GFR, Estimated: >60 mL/min (ref >60)
Glucose, Bld: 93 mg/dL (ref 70–99)
Potassium: 3.8 mmol/L (ref 3.5–5.1)
Sodium: 139 mmol/L (ref 135–145)

## 2023-09-03 LAB — URINALYSIS, ROUTINE W REFLEX MICROSCOPIC
Bilirubin Urine: NEGATIVE
Glucose, UA: NEGATIVE mg/dL
Ketones, ur: NEGATIVE mg/dL
Nitrite: POSITIVE — AB
Protein, ur: NEGATIVE mg/dL
Specific Gravity, Urine: 1.02 (ref 1.005–1.030)
pH: 7 (ref 5.0–8.0)

## 2023-09-03 LAB — CBC
HCT: 44.3 % (ref 36.0–46.0)
Hemoglobin: 14.5 g/dL (ref 12.0–15.0)
MCH: 28.5 pg (ref 26.0–34.0)
MCHC: 32.7 g/dL (ref 30.0–36.0)
MCV: 87 fL (ref 80.0–100.0)
Platelets: 247 10*3/uL (ref 150–400)
RBC: 5.09 MIL/uL (ref 3.87–5.11)
RDW: 12.2 % (ref 11.5–15.5)
WBC: 6.2 10*3/uL (ref 4.0–10.5)
nRBC: 0 % (ref 0.0–0.2)

## 2023-09-03 LAB — URINALYSIS, MICROSCOPIC (REFLEX): WBC, UA: >50 WBC/hpf (ref 0–5)

## 2023-09-03 LAB — PREGNANCY, URINE: Preg Test, Ur: NEGATIVE

## 2023-09-03 MED ORDER — LAMOTRIGINE 25 MG PO CHEW: 25.0000 mg | CHEWABLE_TABLET | Freq: Every day | ORAL | 2 refills | Status: DC

## 2023-09-03 MED ORDER — IBUPROFEN 400 MG PO TABS
600.0000 mg | ORAL_TABLET | Freq: Once | ORAL | Status: AC
  Administered 2023-09-03: 600 mg via ORAL
  Filled 2023-09-03: qty 2

## 2023-09-03 NOTE — ED Notes (Signed)
ED Provider at bedside. 

## 2023-09-03 NOTE — ED Notes (Signed)
Pt resting at this time. SO at bedside.

## 2023-09-03 NOTE — ED Notes (Signed)
Pt assisted to restroom.  
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
## 2023-09-03 NOTE — ED Triage Notes (Signed)
Pov from home. Cc of seizure. Hx of "a few" Came with bf, stated around 8 minutes he stated she was foamy and stiff. Says she was asleep before and after.  She says she doesn't remember it. Denies loss of urine. Does not have neurologist.

## 2023-09-03 NOTE — Discharge Instructions (Signed)
You were seen in the emergency department for possible seizure.  Neurology is recommending that we start you on a seizure medication.  You will take 1 tablet daily for 3 weeks and then increase to 2 tablets daily.  I have also put in a referral for you to follow-up with neurology.  Per [employer] law you may not operate a vehicle until you have been seizure-free and cleared by neurology.  Return to the emergency department if any worsening or concerning symptoms

## 2023-09-05 ENCOUNTER — Encounter: Payer: Self-pay | Admitting: Neurology

## 2023-09-15 ENCOUNTER — Encounter: Payer: Self-pay | Admitting: Nurse Practitioner

## 2023-09-15 ENCOUNTER — Ambulatory Visit: Payer: Medicaid Other | Admitting: Nurse Practitioner

## 2023-09-15 VITALS — BP 123/82 | Ht 65.0 in | Wt 119.6 lb

## 2023-09-15 DIAGNOSIS — Z87898 Personal history of other specified conditions: Secondary | ICD-10-CM | POA: Diagnosis not present

## 2023-09-15 DIAGNOSIS — B9689 Other specified bacterial agents as the cause of diseases classified elsewhere: Secondary | ICD-10-CM | POA: Diagnosis not present

## 2023-09-15 DIAGNOSIS — G40909 Epilepsy, unspecified, not intractable, without status epilepticus: Secondary | ICD-10-CM | POA: Diagnosis not present

## 2023-09-15 DIAGNOSIS — R21 Rash and other nonspecific skin eruption: Secondary | ICD-10-CM | POA: Diagnosis not present

## 2023-09-15 DIAGNOSIS — J069 Acute upper respiratory infection, unspecified: Secondary | ICD-10-CM | POA: Diagnosis not present

## 2023-09-15 MED ORDER — GABAPENTIN 100 MG PO CAPS: 100.0000 mg | ORAL_CAPSULE | Freq: Two times a day (BID) | ORAL | 0 refills | Status: DC

## 2023-09-15 MED ORDER — AMOXICILLIN-POT CLAVULANATE 875-125 MG PO TABS: 1.0000 | ORAL_TABLET | Freq: Two times a day (BID) | ORAL | 0 refills | Status: DC

## 2023-09-15 MED ORDER — FLUCONAZOLE 150 MG PO TABS: ORAL_TABLET | ORAL | 0 refills | Status: DC

## 2023-09-15 MED ORDER — TRIAMCINOLONE ACETONIDE 0.1 % EX CREA: 1.0000 | TOPICAL_CREAM | Freq: Two times a day (BID) | CUTANEOUS | 0 refills | Status: DC

## 2023-09-15 NOTE — Progress Notes (Signed)
Subjective:    Patient ID: Katelyn Martinez, female    DOB: 02/21/1996, 27 y.o.   MRN: [id number]  HPI Presents for several issues.  Is following up after being seen at local ED on 09/03/2023 for possible seizure activity.  No further known seizures since that time.  Was started on Lamictal 25 twice daily.  Has scheduled her appointment with neurology for February.  Patient has noticed a rash on her knees and feet that began a few days ago.  The area on her knees are pruritic.  No generalized rash or hives.  No erythema or swelling of the joints.  No other changes in her medications.  No known allergens. Patient concerned because of her urine test results at ED visit.  States that they did not give her antibiotic for UTI at that time.  Has been experiencing urinary burning urgency and frequency.  Voiding small amounts.  No recent UTIs.  Has had unprotected sex with her current partner she has been with for 3 months but he has had a vasectomy.  Patient has an IUD for birth control.  No fevers. Has also had a "bad cough" with congestion for the past 2 weeks.  Now producing green mucus at times.  Generalized headache especially with prolonged cough.  No wheezing.  Slight chest pain at times.  No ear pain.  Sore throat.  Former smoker.  Denies vaping.  Does use marijuana on a daily basis. Review of the urinalysis and microscopic exam from her ED visit on 09/03/2023 indicates poor sample with multiple epithelial cells and mucus.  Also note patient was on clindamycin at the time of this encounter.   Review of Systems  Constitutional:  Negative for fever.  HENT:  Positive for congestion, postnasal drip, sinus pressure and sore throat. Negative for ear pain.   Respiratory:  Positive for cough. Negative for chest tightness, shortness of breath and wheezing.   Cardiovascular:  Positive for chest pain.  Gastrointestinal:        Denies acid reflux or heartburn.  Genitourinary:  Positive for dysuria,  frequency and urgency.  Skin:  Positive for rash.      09/15/2023   11:03 AM  Depression screen PHQ 2/9  Decreased Interest 2  Down, Depressed, Hopeless 2  PHQ - 2 Score 4  Altered sleeping 2  Tired, decreased energy 3  Change in appetite 3  Feeling bad or failure about yourself  1  Trouble concentrating 2  Moving slowly or fidgety/restless 0  Suicidal thoughts 0  PHQ-9 Score 15  Difficult doing work/chores Somewhat difficult      09/15/2023   11:03 AM 08/15/2023    3:32 PM 07/12/2023    2:36 PM 01/16/2023    3:09 PM  GAD 7 : Generalized Anxiety Score  Nervous, Anxious, on Edge 2 2 0 1  Control/stop worrying 2 2 0 1  Worry too much - different things 2 2 0 1  Trouble relaxing 2 0 0 1  Restless 0 0 0 0  Easily annoyed or irritable 2 2 0 1  Afraid - awful might happen 0 2 0 1  Total GAD 7 Score 10 10 0 6  Anxiety Difficulty Somewhat difficult Somewhat difficult  Not difficult at all         Objective:   Physical Exam NAD.  Alert, oriented.  Calm affect.  Making good eye contact.  Dressed appropriately for the weather.  Speech clear.  Behavior and mood normal.  Thoughts logical coherent and relevant.  Odor of marijuana noticed.  TMs mildly retracted bilaterally, no erythema.  Pharynx injected with yellowish PND noted.  Neck supple with mild soft anterior adenopathy.  Lungs clear.  No tachypnea.  Heart regular rate rhythm.  Moderately pink maculopapular rash noted particularly around the knee area which is fairly confluent on the left with scattered lesions on the lower legs and feet. Today's Vitals   09/15/23 1059  BP: 123/82  Weight: 119 lb 9.6 oz (54.3 kg)  Height: 5\' 5"  (1.651 m)   Body mass index is 19.9 kg/m.  Component Ref Range & Units (hover) 1 mo ago  Neisseria Gonorrhea Negative  Chlamydia Negative  Trichomonas Negative  Bacterial Vaginitis (gardnerella) Positive Abnormal         Assessment & Plan:   Problem List Items Addressed This Visit        Respiratory   Bacterial URI - Primary   Relevant Medications   fluconazole (DIFLUCAN) 150 MG tablet     Nervous and Auditory   Seizure disorder (HCC)   Relevant Medications   gabapentin (NEURONTIN) 100 MG capsule     Other   History of seizure   Other Visit Diagnoses       Rash and nonspecific skin eruption          Meds ordered this encounter  Medications   triamcinolone cream (KENALOG) 0.1 %    Sig: Apply 1 Application topically 2 (two) times daily. Prn rash; use up to 2 weeks    Dispense:  30 g    Refill:  0    Supervising Provider:   Lilyan Punt A [[id number]]   fluconazole (DIFLUCAN) 150 MG tablet    Sig: One po qd prn yeast infection; may repeat in 3-4 days if needed    Dispense:  2 tablet    Refill:  0    Supervising Provider:   Lilyan Punt A [[id number]]   amoxicillin-clavulanate (AUGMENTIN) 875-125 MG tablet    Sig: Take 1 tablet by mouth 2 (two) times daily.    Dispense:  14 tablet    Refill:  0    Supervising Provider:   Lilyan Punt A [[id number]]   gabapentin (NEURONTIN) 100 MG capsule    Sig: Take 1 capsule (100 mg total) by mouth 2 (two) times daily.    Dispense:  60 capsule    Refill:  0    Supervising Provider:   Lilyan Punt A [[id number]]   Triamcinolone cream to rash as directed.  Patient to call back if worsening rash or new symptoms develop. Augmentin as directed.  Due to several recent antibiotics, recommend daily bowel probiotic such as align. As a precaution, stop Lamictal.  Patient is requesting gabapentin since that is something one of her family members uses for seizures successfully.  Trial of low-dose gabapentin as directed. Follow-up with neurology as planned in February, call our office or seek help immediately if any seizure activity. Due to all the changes in her medications and multiple issues, recommend follow-up visit to discuss her anxiety and depression symptoms.  Note that patient is not suicidal.

## 2023-10-20 ENCOUNTER — Other Ambulatory Visit: Payer: Self-pay | Admitting: Family Medicine

## 2023-10-20 NOTE — Telephone Encounter (Signed)
Last Fill: 08/26/23  Last OV: 09/15/23 Next OV: None Scheduled  Routing to provider for review/authorization.

## 2023-10-20 NOTE — Telephone Encounter (Signed)
Copied from CRM [id number]. Topic: Clinical - Medication Refill >> Oct 20, 2023  4:45 PM Antony Haste wrote: Most Recent Primary Care Visit:  Provider: Campbell Riches  Department: [facility]  Visit Type: ACUTE  Date: 09/15/2023  Medication: gabapentin (NEURONTIN) 100 MG capsule  Has the patient contacted their pharmacy? Yes (Agent: If no, request that the patient contact the pharmacy for the refill. If patient does not wish to contact the pharmacy document the reason why and proceed with request.) (Agent: If yes, when and what did the pharmacy advise?)  Is this the correct pharmacy for this prescription? Yes If no, delete pharmacy and type the correct one.  This is the patient's preferred pharmacy:   [facility] - [place], [place] - [address] [place] [address] Phone: [phone] Fax: [phone]   Has the prescription been filled recently? No  Is the patient out of the medication? Yes  Has the patient been seen for an appointment in the last year OR does the patient have an upcoming appointment? No  Can we respond through MyChart? No  Agent: Please be advised that Rx refills may take up to 3 business days. We ask that you follow-up with your pharmacy.

## 2023-10-23 ENCOUNTER — Ambulatory Visit: Payer: Medicaid Other | Admitting: Neurology

## 2023-10-23 ENCOUNTER — Encounter: Payer: Self-pay | Admitting: Neurology

## 2023-10-23 VITALS — BP 104/73 | HR 79 | Ht 65.0 in | Wt 120.8 lb

## 2023-10-23 DIAGNOSIS — R569 Unspecified convulsions: Secondary | ICD-10-CM | POA: Diagnosis not present

## 2023-10-23 DIAGNOSIS — M545 Low back pain, unspecified: Secondary | ICD-10-CM | POA: Diagnosis not present

## 2023-10-23 MED ORDER — GABAPENTIN 100 MG PO CAPS: ORAL_CAPSULE | ORAL | 6 refills | Status: DC

## 2023-10-23 MED ORDER — GABAPENTIN 100 MG PO CAPS: 100.0000 mg | ORAL_CAPSULE | Freq: Two times a day (BID) | ORAL | 0 refills | Status: DC

## 2023-10-23 NOTE — Patient Instructions (Signed)
Good to meet you.  Schedule MRI brain with and without contrast  2. Schedule 1-hour EEG  3. Increase Gabapentin 100mg : take 1 cap in AM, 2 caps in PM  4. Referral will be sent to Ortho for back pain  5. Follow-up in 3 months, call for any changes   Seizure Precautions: 1. If medication has been prescribed for you to prevent seizures, take it exactly as directed.  Do not stop taking the medicine without talking to your doctor first, even if you have not had a seizure in a long time.   2. Avoid activities in which a seizure would cause danger to yourself or to others.  Don't operate dangerous machinery, swim alone, or climb in high or dangerous places, such as on ladders, roofs, or girders.  Do not drive unless your doctor says you may.  3. If you have any warning that you may have a seizure, lay down in a safe place where you can't hurt yourself.    4.  No driving for 6 months from last seizure, as per [facility].   Please refer to the following link on the [employer]ile[employer] of [employer]'s website for more information: http://www.epilepsyfoundation.org/answerplace/Social/driving/drivingu.cfm   5.  Maintain good sleep hygiene. Avoid alcohol.  6.  Notify your neurology if you are planning pregnancy or if you become pregnant.  7.  Contact your doctor if you have any problems that may be related to the medicine you are taking.  8.  Call 911 and bring the patient back to the ED if:        A.  The seizure lasts longer than 5 minutes.       B.  The patient doesn't awaken shortly after the seizure  C.  The patient has new problems such as difficulty seeing, speaking or moving  D.  The patient was injured during the seizure  E.  The patient has a temperature over 102 F (39C)  F.  The patient vomited and now is having trouble breathing
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
## 2023-11-02 ENCOUNTER — Emergency Department (HOSPITAL_COMMUNITY): Payer: Medicaid Other

## 2023-11-02 ENCOUNTER — Emergency Department (HOSPITAL_COMMUNITY)
Admission: EM | Admit: 2023-11-02 | Discharge: 2023-11-03 | Disposition: A | Discharge status: Home / Self Care | Payer: Medicaid Other | Attending: Emergency Medicine | Admitting: Emergency Medicine

## 2023-11-02 ENCOUNTER — Ambulatory Visit: Payer: Medicaid Other | Admitting: Neurology

## 2023-11-02 ENCOUNTER — Other Ambulatory Visit: Payer: Self-pay

## 2023-11-02 ENCOUNTER — Encounter (HOSPITAL_COMMUNITY): Payer: Self-pay | Admitting: Emergency Medicine

## 2023-11-02 DIAGNOSIS — R413 Other amnesia: Secondary | ICD-10-CM | POA: Diagnosis not present

## 2023-11-02 DIAGNOSIS — R569 Unspecified convulsions: Secondary | ICD-10-CM

## 2023-11-02 DIAGNOSIS — G9389 Other specified disorders of brain: Secondary | ICD-10-CM | POA: Diagnosis not present

## 2023-11-02 DIAGNOSIS — R9431 Abnormal electrocardiogram [ECG] [EKG]: Secondary | ICD-10-CM | POA: Diagnosis not present

## 2023-11-02 DIAGNOSIS — R410 Disorientation, unspecified: Secondary | ICD-10-CM | POA: Diagnosis present

## 2023-11-02 LAB — COMPREHENSIVE METABOLIC PANEL
ALT: 13 U/L (ref 0–44)
AST: 22 U/L (ref 15–41)
Albumin: 4.2 g/dL (ref 3.5–5.0)
Alkaline Phosphatase: 48 U/L (ref 38–126)
Anion gap: 7 (ref 5–15)
BUN: 12 mg/dL (ref 6–20)
CO2: 27 mmol/L (ref 22–32)
Calcium: 8.9 mg/dL (ref 8.9–10.3)
Chloride: 105 mmol/L (ref 98–111)
Creatinine, Ser: 0.79 mg/dL (ref 0.44–1.00)
GFR, Estimated: >60 mL/min (ref >60)
Glucose, Bld: 88 mg/dL (ref 70–99)
Potassium: 3.9 mmol/L (ref 3.5–5.1)
Sodium: 139 mmol/L (ref 135–145)
Total Bilirubin: 0.8 mg/dL (ref 0.0–1.2)
Total Protein: 7.2 g/dL (ref 6.5–8.1)

## 2023-11-02 LAB — URINALYSIS, ROUTINE W REFLEX MICROSCOPIC
Bilirubin Urine: NEGATIVE
Glucose, UA: NEGATIVE mg/dL
Hgb urine dipstick: NEGATIVE
Ketones, ur: NEGATIVE mg/dL
Nitrite: NEGATIVE
Protein, ur: NEGATIVE mg/dL
Specific Gravity, Urine: 1.019 (ref 1.005–1.030)
pH: 6 (ref 5.0–8.0)

## 2023-11-02 LAB — CBC WITH DIFFERENTIAL/PLATELET
Abs Immature Granulocytes: 0.01 10*3/uL (ref 0.00–0.07)
Basophils Absolute: 0 10*3/uL (ref 0.0–0.1)
Basophils Relative: 0 %
Eosinophils Absolute: 0.1 10*3/uL (ref 0.0–0.5)
Eosinophils Relative: 2 %
HCT: 40 % (ref 36.0–46.0)
Hemoglobin: 13.4 g/dL (ref 12.0–15.0)
Immature Granulocytes: 0 %
Lymphocytes Relative: 30 %
Lymphs Abs: 1.7 10*3/uL (ref 0.7–4.0)
MCH: 29.1 pg (ref 26.0–34.0)
MCHC: 33.5 g/dL (ref 30.0–36.0)
MCV: 87 fL (ref 80.0–100.0)
Monocytes Absolute: 0.3 10*3/uL (ref 0.1–1.0)
Monocytes Relative: 6 %
Neutro Abs: 3.7 10*3/uL (ref 1.7–7.7)
Neutrophils Relative %: 62 %
Platelets: 231 10*3/uL (ref 150–400)
RBC: 4.6 MIL/uL (ref 3.87–5.11)
RDW: 12.3 % (ref 11.5–15.5)
WBC: 5.9 10*3/uL (ref 4.0–10.5)
nRBC: 0 % (ref 0.0–0.2)

## 2023-11-02 LAB — CBG MONITORING, ED: Glucose-Capillary: 76 mg/dL (ref 70–99)

## 2023-11-02 LAB — RAPID URINE DRUG SCREEN, HOSP PERFORMED
Amphetamines: NOT DETECTED
Barbiturates: NOT DETECTED
Benzodiazepines: NOT DETECTED
Cocaine: NOT DETECTED
Opiates: NOT DETECTED
Tetrahydrocannabinol: POSITIVE — AB

## 2023-11-02 LAB — POC URINE PREG, ED: Preg Test, Ur: NEGATIVE

## 2023-11-02 LAB — ETHANOL: Alcohol, Ethyl (B): <10 mg/dL (ref <10)

## 2023-11-02 MED ORDER — GABAPENTIN 100 MG PO CAPS: 300.0000 mg | ORAL_CAPSULE | Freq: Two times a day (BID) | ORAL | 6 refills | Status: DC

## 2023-11-02 NOTE — ED Notes (Signed)
Patient ambulatory to restroom  ?

## 2023-11-02 NOTE — Discharge Instructions (Signed)
You were seen for your possible seizure in the emergency department.   At home, please take the increased dose of gabapentin.  Refrain from activities that could be dangerous when you have a seizure such as climbing a ladder or swimming.  Do not drive for at least 6 months.  Talk to your neurologist to see when it is safe to resume these activities.    Check your MyChart online for the results of any tests that had not resulted by the time you left the emergency department.   Follow-up with your primary doctor in 2-3 days regarding your visit.  Call your neurologist tomorrow about adjusting your seizure medication and to discuss your next appointment.  Return immediately to the emergency department if you experience any of the following: Seizures lasting more than 5 minutes, or any other concerning symptoms.    Thank you for visiting our Emergency Department. It was a pleasure taking care of you today.

## 2023-11-02 NOTE — ED Notes (Signed)
Patient returned from CT.  Lab at bedside

## 2023-11-02 NOTE — Progress Notes (Signed)
EEG complete - results pending

## 2023-11-02 NOTE — ED Provider Notes (Signed)
[place] EMERGENCY DEPARTMENT AT [facility] Provider Note   CSN: [id number] Arrival date & time: 11/02/23  2106     History {Add pertinent medical, surgical, social history, OB history to HPI:1} Chief Complaint  Patient presents with   Memory Loss   Seizures    Katelyn Martinez is a 28 y.o. female.  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  Patient reports that after her TBI in 2016 she has had seizures while sleeping several times.  Says that today she felt very sleep deprived and took a nap and then when she came to she was in her car driving to her mother's house.  No one witnessed it but is concerned that she may have had a seizure while she was sleeping.  Last seizure was in December.  Says that she is on gabapentin 200 mg / 100 mg daily.  Has tried Lamictal and Keppra but had to stop due to adverse reactions.  Also think she has has a UTI due to some dysuria recently.       Home Medications Prior to Admission medications   Medication Sig Start Date End Date Taking? Authorizing Provider  gabapentin (NEURONTIN) 100 MG capsule Take 1 capsule in AM, 2 capsules in PM 10/23/23   Van Clines, MD  levonorgestrel (MIRENA) 20 MCG/DAY IUD 1 each by Intrauterine route once.    [provider]      Allergies    Lamictal [lamotrigine]    Review of Systems   Review of Systems  Physical Exam Updated Vital Signs BP 135/85   Pulse 74   Temp 98.2 F (36.8 C)   Resp 18   Ht 5\' 5"  (1.651 m)   Wt 54.8 kg   SpO2 99%   BMI 20.10 kg/m  Physical Exam Vitals and nursing note reviewed.  Constitutional:      General: She is not in acute distress.    Appearance: She is well-developed.  HENT:     Head: Normocephalic and atraumatic.     Right Ear: External ear normal.     Left Ear: External ear normal.     Nose: Nose normal.  Eyes:     Extraocular Movements:  Extraocular movements intact.     Conjunctiva/sclera: Conjunctivae normal.     Pupils: Pupils are equal, round, and reactive to light.  Cardiovascular:     Rate and Rhythm: Normal rate and regular rhythm.     Heart sounds: No murmur heard. Pulmonary:     Effort: Pulmonary effort is normal. No respiratory distress.     Breath sounds: Normal breath sounds.  Musculoskeletal:     Cervical back: Normal range of motion and neck supple.     Right lower leg: No edema.     Left lower leg: No edema.  Skin:    General: Skin is warm and dry.  Neurological:     Mental Status: She is alert and oriented to person, place, and time. Mental status is at baseline.  Psychiatric:        Mood and Affect: Mood normal.     ED Results / Procedures / Treatments   Labs (all labs ordered are listed, but only abnormal results are displayed) Labs Reviewed  URINALYSIS, ROUTINE W REFLEX MICROSCOPIC - Abnormal; Notable for the following components:      Result Value   APPearance HAZY (*)    Leukocytes,Ua TRACE (*)  Bacteria, UA RARE (*)    All other components within normal limits  RAPID URINE DRUG SCREEN, HOSP PERFORMED - Abnormal; Notable for the following components:   Tetrahydrocannabinol POSITIVE (*)    All other components within normal limits  COMPREHENSIVE METABOLIC PANEL  CBC WITH DIFFERENTIAL/PLATELET  ETHANOL  CBG MONITORING, ED  POC URINE PREG, ED    EKG None  Radiology CT Head Wo Contrast Result Date: 11/02/2023 CLINICAL DATA:  Seizure and memory loss. EXAM: CT HEAD WITHOUT CONTRAST TECHNIQUE: Contiguous axial images were obtained from the base of the skull through the vertex without intravenous contrast. RADIATION DOSE REDUCTION: This exam was performed according to the departmental dose-optimization program which includes automated exposure control, adjustment of the mA and/or kV according to patient size and/or use of iterative reconstruction technique. COMPARISON:  CT head 08/27/2022  FINDINGS: Brain: No intracranial hemorrhage, mass effect, or evidence of acute infarct. No hydrocephalus. No extra-axial fluid collection. 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 Vascular: No hyperdense vessel or unexpected calcification. Skull: No fracture or focal lesion. Sinuses/Orbits: No acute finding. Other: None. IMPRESSION: 1. No acute intracranial abnormality. 2. 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 Electronically Signed   By: Minerva Fester M.D.   On: 11/02/2023 22:21    Procedures Procedures  {Document cardiac monitor, telemetry assessment procedure when appropriate:1}  Medications Ordered in ED Medications - No data to display  ED Course/ Medical Decision Making/ A&P   {   Click here for ABCD2, HEART and other calculatorsREFRESH Note before signing :1}                              Medical Decision Making Amount and/or Complexity of Data Reviewed Labs: ordered. Radiology: ordered.   ***  {Document critical care time when appropriate:1} {Document review of labs and clinical decision tools ie heart score, Chads2Vasc2 etc:1}  {Document your independent review of radiology images, and any outside records:1} {Document your discussion with family members, caretakers, and with consultants:1} {Document social determinants of health affecting pt's care:1} {Document your decision making why or why not admission, treatments were needed:1} Final Clinical Impression(s) / ED Diagnoses Final diagnoses:  None    Rx / DC Orders ED Discharge Orders     None

## 2023-11-02 NOTE — ED Triage Notes (Signed)
Pt states that she was diagnosed epileptic x 2 weeks ago, states that she was placed on gabapentin. States that she went to sleep and she came back to driving. She states that she does not remember getting dressed or getting in the car to drive anywhere, states that she doesn't remember calling her mother in law.

## 2023-11-02 NOTE — ED Notes (Signed)
Patient taken to CT at this time.

## 2023-11-03 NOTE — ED Notes (Signed)
Patient educated in depth about importance of not driving until cleared by neurologist.  Patient reports that she has to drive "because she has no one else that can."

## 2023-11-06 ENCOUNTER — Telehealth: Payer: Self-pay

## 2023-11-06 ENCOUNTER — Encounter: Payer: Self-pay | Admitting: Nurse Practitioner

## 2023-11-06 NOTE — Telephone Encounter (Signed)
Pt called back and was informed the labs and notes from ED showed no UTI, pt states she is having burning when urinating and just general discomfort and is wondering what to do from here

## 2023-11-06 NOTE — Telephone Encounter (Signed)
Called and left a message for pt to return call.  

## 2023-11-07 ENCOUNTER — Telehealth: Payer: Self-pay | Admitting: Neurology

## 2023-11-07 NOTE — Telephone Encounter (Signed)
Patient was at work, came home from work and took a nap. She had a seizure in her sleep, woke up, she felt like she blacked out.. started driving to her moms and has no idea how she got there. how she got there. She said its like she was spaced/blacked out. Dr.aquino said she would keep her on the gabapentin her previous doctor put her on but she feels she needs to be changed to another medication

## 2023-11-10 MED ORDER — ZONISAMIDE 100 MG PO CAPS: ORAL_CAPSULE | ORAL | 3 refills | Status: DC

## 2023-11-10 NOTE — Telephone Encounter (Signed)
Pls let her know I would like to start a different seizure medication that also helps with headaches, it is called Zonisamide. We have to start at a low dose and gradually increase, start 100mg : 1 cap every night for 2 weeks, then increase to 2 caps every night for 2 weeks, then increase to 3 caps every night and continue. Continue on Gabapentin for now also. Zonisamide is at bedtime medication, side effects may include drowsiness, numbness and tingling in hands and feet that usually go away as body gets used to medication. I sent in Rx, thanks

## 2023-11-10 NOTE — Telephone Encounter (Signed)
Pt called no answer left a voice mail and a mychart message was sent

## 2023-11-22 DIAGNOSIS — N9489 Other specified conditions associated with female genital organs and menstrual cycle: Secondary | ICD-10-CM | POA: Diagnosis not present

## 2023-11-22 DIAGNOSIS — R300 Dysuria: Secondary | ICD-10-CM | POA: Diagnosis not present

## 2023-11-22 DIAGNOSIS — N390 Urinary tract infection, site not specified: Secondary | ICD-10-CM | POA: Diagnosis not present

## 2023-11-22 DIAGNOSIS — U071 COVID-19: Secondary | ICD-10-CM | POA: Diagnosis not present

## 2023-11-29 ENCOUNTER — Encounter: Payer: Self-pay | Admitting: Neurology

## 2023-12-01 DIAGNOSIS — J069 Acute upper respiratory infection, unspecified: Secondary | ICD-10-CM | POA: Diagnosis not present

## 2023-12-01 DIAGNOSIS — J019 Acute sinusitis, unspecified: Secondary | ICD-10-CM | POA: Diagnosis not present

## 2023-12-02 ENCOUNTER — Encounter: Payer: Self-pay | Admitting: Women's Health

## 2023-12-19 ENCOUNTER — Ambulatory Visit: Admitting: Women's Health

## 2023-12-19 ENCOUNTER — Other Ambulatory Visit (HOSPITAL_COMMUNITY)
Admission: RE | Admit: 2023-12-19 | Discharge: 2023-12-19 | Disposition: A | Discharge status: Home / Self Care | Source: Ambulatory Visit | Attending: Women's Health | Admitting: Women's Health

## 2023-12-19 ENCOUNTER — Encounter: Payer: Self-pay | Admitting: Women's Health

## 2023-12-19 VITALS — BP 115/78 | HR 105 | Ht 65.0 in | Wt 117.0 lb

## 2023-12-19 DIAGNOSIS — N898 Other specified noninflammatory disorders of vagina: Secondary | ICD-10-CM | POA: Diagnosis not present

## 2023-12-19 NOTE — Progress Notes (Signed)
 GYN VISIT Patient name: Katelyn Martinez MRN [id number]  Date of birth: Jul 23, 1996 Chief Complaint:   "bumps on vaginal area" (Possible yeast infection too-recent course of antibiotics)  History of Present Illness:   Katelyn Martinez is a 28 y.o. [id number] Caucasian female being seen today for report of bumps and vaginal itching and discharge.Has been on 3 rounds of antibiotics recently. Tried yeast and bv meds w/o relief.     No LMP recorded. (Menstrual status: IUD). The current method of family planning is IUD.  Last pap 07/12/23. Results were: NILM w/ HRHPV positive: other (not 16, 18/45)     09/15/2023   11:03 AM 08/15/2023    3:31 PM 07/12/2023    2:36 PM 07/12/2023    2:35 PM 01/16/2023    3:09 PM  Depression screen PHQ 2/9  Decreased Interest 2 2 0 0 0  Down, Depressed, Hopeless 2 2 0 0 0  PHQ - 2 Score 4 4 0 0 0  Altered sleeping 2 2 0  0  Tired, decreased energy 3 2 0  0  Change in appetite 3 2 0  2  Feeling bad or failure about yourself  1 2 0  0  Trouble concentrating 2 1 0  0  Moving slowly or fidgety/restless 0 2 0  0  Suicidal thoughts 0 0 0  0  PHQ-9 Score 15 15 0  2  Difficult doing work/chores Somewhat difficult Very difficult   Not difficult at all        09/15/2023   11:03 AM 08/15/2023    3:32 PM 07/12/2023    2:36 PM 01/16/2023    3:09 PM  GAD 7 : Generalized Anxiety Score  Nervous, Anxious, on Edge 2 2 0 1  Control/stop worrying 2 2 0 1  Worry too much - different things 2 2 0 1  Trouble relaxing 2 0 0 1  Restless 0 0 0 0  Easily annoyed or irritable 2 2 0 1  Afraid - awful might happen 0 2 0 1  Total GAD 7 Score 10 10 0 6  Anxiety Difficulty Somewhat difficult Somewhat difficult  Not difficult at all     Review of Systems:   Pertinent items are noted in HPI Denies fever/chills, dizziness, headaches, visual disturbances, fatigue, shortness of breath, chest pain, abdominal pain, vomiting, abnormal vaginal discharge/itching/odor/irritation,  problems with periods, bowel movements, urination, or intercourse unless otherwise stated above.  Pertinent History Reviewed:  Reviewed past medical,surgical, social, obstetrical and family history.  Reviewed problem list, medications and allergies. Physical Assessment:   Vitals:   12/19/23 1532  BP: 115/78  Pulse: (!) 105  Weight: 117 lb (53.1 kg)  Height: 5\' 5"  (1.651 m)  Body mass index is 19.47 kg/m.       Physical Examination:   General appearance: alert, well appearing, and in no distress  Mental status: alert, oriented to person, place, and time  Skin: warm & dry   Cardiovascular: normal heart rate noted  Respiratory: normal respiratory effort, no distress  Abdomen: soft, non-tender   Pelvic: VULVA: no masses, tenderness. Small flat light brown spots lower buttocks, do not appear to be STD, cysts, etc. Almost appear to be like freckles.  VAGINA: normal appearing vagina with normal color and thin discharge w/ small clumps, no lesions, CERVIX: normal appearing cervix without discharge or lesions, IUD strings visible and appropriate length  Extremities: no edema   Chaperone: Latisha Cresenzo  No results found for this  or any previous visit (from the past 24 hours).  Assessment & Plan:  1) Vaginal itching and d/c> CV swab  2) Small hyperpigmented flat spots lower buttocks> ? Freckles, reassured do not appear to be STD/cysts, etc  Meds: No orders of the defined types were placed in this encounter.   No orders of the defined types were placed in this encounter.   Return for after 10/23 , Pap & physical.  Cheral Marker CNM, [facility] 12/19/2023 4:10 PM

## 2023-12-21 ENCOUNTER — Encounter: Payer: Self-pay | Admitting: Women's Health

## 2023-12-21 ENCOUNTER — Other Ambulatory Visit: Payer: Self-pay | Admitting: Women's Health

## 2023-12-21 DIAGNOSIS — Z113 Encounter for screening for infections with a predominantly sexual mode of transmission: Secondary | ICD-10-CM

## 2023-12-21 LAB — CERVICOVAGINAL ANCILLARY ONLY
Bacterial Vaginitis (gardnerella): NEGATIVE
Candida Glabrata: NEGATIVE
Candida Vaginitis: NEGATIVE
Chlamydia: NEGATIVE
Comment: NEGATIVE
Comment: NEGATIVE
Comment: NEGATIVE
Comment: NEGATIVE
Comment: NEGATIVE
Comment: NORMAL
Neisseria Gonorrhea: NEGATIVE
Trichomonas: NEGATIVE

## 2023-12-23 LAB — HSV 1 AND 2 AB, IGG
HSV 1 Glycoprotein G Ab, IgG: REACTIVE — AB
HSV 2 IgG, Type Spec: NONREACTIVE

## 2023-12-26 ENCOUNTER — Encounter: Payer: Self-pay | Admitting: Women's Health

## 2024-01-23 ENCOUNTER — Ambulatory Visit: Admitting: *Deleted

## 2024-01-23 ENCOUNTER — Ambulatory Visit

## 2024-01-23 ENCOUNTER — Other Ambulatory Visit (HOSPITAL_COMMUNITY)
Admission: RE | Admit: 2024-01-23 | Discharge: 2024-01-23 | Disposition: A | Discharge status: Home / Self Care | Source: Ambulatory Visit | Attending: Obstetrics & Gynecology | Admitting: Obstetrics & Gynecology

## 2024-01-23 ENCOUNTER — Other Ambulatory Visit: Payer: Self-pay | Admitting: Adult Health

## 2024-01-23 DIAGNOSIS — R8290 Unspecified abnormal findings in urine: Secondary | ICD-10-CM

## 2024-01-23 DIAGNOSIS — N898 Other specified noninflammatory disorders of vagina: Secondary | ICD-10-CM | POA: Insufficient documentation

## 2024-01-23 DIAGNOSIS — R300 Dysuria: Secondary | ICD-10-CM | POA: Diagnosis not present

## 2024-01-23 LAB — POCT URINALYSIS DIPSTICK OB
Blood, UA: NEGATIVE
Glucose, UA: NEGATIVE
Ketones, UA: NEGATIVE
Leukocytes, UA: NEGATIVE
Nitrite, UA: POSITIVE
POC,PROTEIN,UA: NEGATIVE

## 2024-01-23 MED ORDER — SULFAMETHOXAZOLE-TRIMETHOPRIM 800-160 MG PO TABS: 1.0000 | ORAL_TABLET | Freq: Two times a day (BID) | ORAL | 0 refills | Status: DC

## 2024-01-23 NOTE — Progress Notes (Signed)
   NURSE VISIT- UTI SYMPTOMS   SUBJECTIVE:  Katelyn Martinez is a 28 y.o. [phone] female here for UTI symptoms. She is a GYN patient. She reports dysuria, flank pain bilaterally, and odor to urine . Also still having vaginal itching.   OBJECTIVE:  There were no vitals taken for this visit.  Appears well, in no apparent distress  Results for orders placed or performed in visit on 01/23/24 (from the past 24 hours)  POC Urinalysis Dipstick OB   Collection Time: 01/23/24  3:31 PM  Result Value Ref Range   Color, UA     Clarity, UA     Glucose, UA Negative Negative   Bilirubin, UA     Ketones, UA neg    Spec Grav, UA     Blood, UA neg    pH, UA     POC,PROTEIN,UA Negative Negative, Trace, Small (1+), Moderate (2+), Large (3+), 4+   Urobilinogen, UA     Nitrite, UA positive    Leukocytes, UA Negative Negative   Appearance     Odor      ASSESSMENT: GYN patient with UTI symptoms and positive nitrites  PLAN: Note routed to Lendia Quay, AGNP   Rx sent by provider today: Yes, pt requesting Urine culture sent Call or return to clinic prn if these symptoms worsen or fail to improve as anticipated. Follow-up: as scheduled   Laverne Potter  01/23/2024 3:33 PM

## 2024-01-23 NOTE — Progress Notes (Signed)
 Rx septra ds

## 2024-01-24 ENCOUNTER — Ambulatory Visit: Payer: Medicaid Other | Admitting: Neurology

## 2024-01-24 ENCOUNTER — Ambulatory Visit: Admitting: Women's Health

## 2024-01-24 ENCOUNTER — Encounter: Payer: Self-pay | Admitting: Women's Health

## 2024-01-24 VITALS — BP 119/69 | HR 93 | Ht 65.0 in | Wt 117.0 lb

## 2024-01-24 DIAGNOSIS — Z30432 Encounter for removal of intrauterine contraceptive device: Secondary | ICD-10-CM | POA: Diagnosis not present

## 2024-01-24 DIAGNOSIS — Z3202 Encounter for pregnancy test, result negative: Secondary | ICD-10-CM

## 2024-01-24 LAB — URINALYSIS, ROUTINE W REFLEX MICROSCOPIC
Bilirubin, UA: NEGATIVE
Glucose, UA: NEGATIVE
Ketones, UA: NEGATIVE
Nitrite, UA: POSITIVE — AB
Protein,UA: NEGATIVE
RBC, UA: NEGATIVE
Specific Gravity, UA: 1.019 (ref 1.005–1.030)
Urobilinogen, Ur: 1 mg/dL (ref 0.2–1.0)
pH, UA: 7 (ref 5.0–7.5)

## 2024-01-24 LAB — MICROSCOPIC EXAMINATION
Casts: NONE SEEN /LPF
RBC, Urine: NONE SEEN /HPF (ref 0–2)

## 2024-01-24 NOTE — Progress Notes (Signed)
 IUD REMOVAL  Patient name: Katelyn Martinez MRN [id number]  Date of birth: 10/24/1995 Subjective Findings:   Katelyn Martinez is a 28 y.o. G11P3003 Caucasian female being seen today for removal of a Mirena   IUD. Her IUD was placed 07/09/22.  She desires removal because it makes her feel weird, feels like she's pregnant, neg UPT yesterday, wants HCG. Signed copy of informed consent in chart.  No LMP recorded. (Menstrual status: IUD). Last pap10/23/24. Results were: NILM w/ HRHPV positive: other (not 16, 18/45) The planned method of family planning is abstinence     09/15/2023   11:03 AM 08/15/2023    3:31 PM 07/12/2023    2:36 PM 07/12/2023    2:35 PM 01/16/2023    3:09 PM  Depression screen PHQ 2/9  Decreased Interest 2 2 0 0 0  Down, Depressed, Hopeless 2 2 0 0 0  PHQ - 2 Score 4 4 0 0 0  Altered sleeping 2 2 0  0  Tired, decreased energy 3 2 0  0  Change in appetite 3 2 0  2  Feeling bad or failure about yourself  1 2 0  0  Trouble concentrating 2 1 0  0  Moving slowly or fidgety/restless 0 2 0  0  Suicidal thoughts 0 0 0  0  PHQ-9 Score 15 15 0  2  Difficult doing work/chores Somewhat difficult Very difficult   Not difficult at all        09/15/2023   11:03 AM 08/15/2023    3:32 PM 07/12/2023    2:36 PM 01/16/2023    3:09 PM  GAD 7 : Generalized Anxiety Score  Nervous, Anxious, on Edge 2 2 0 1  Control/stop worrying 2 2 0 1  Worry too much - different things 2 2 0 1  Trouble relaxing 2 0 0 1  Restless 0 0 0 0  Easily annoyed or irritable 2 2 0 1  Afraid - awful might happen 0 2 0 1  Total GAD 7 Score 10 10 0 6  Anxiety Difficulty Somewhat difficult Somewhat difficult  Not difficult at all     Pertinent History Reviewed:   Reviewed past medical,surgical, social, obstetrical and family history.  Reviewed problem list, medications and allergies. Objective Findings & Procedure:    Vitals:   01/24/24 1043  BP: 119/69  Pulse: 93  Weight: 117 lb (53.1 kg)   Height: 5\' 5"  (1.651 m)  Body mass index is 19.47 kg/m.  Results for orders placed or performed in visit on 01/23/24 (from the past 24 hours)  POC Urinalysis Dipstick OB   Collection Time: 01/23/24  3:31 PM  Result Value Ref Range   Color, UA     Clarity, UA     Glucose, UA Negative Negative   Bilirubin, UA     Ketones, UA neg    Spec Grav, UA     Blood, UA neg    pH, UA     POC,PROTEIN,UA Negative Negative, Trace, Small (1+), Moderate (2+), Large (3+), 4+   Urobilinogen, UA     Nitrite, UA positive    Leukocytes, UA Negative Negative   Appearance     Odor    Microscopic Examination   Collection Time: 01/23/24  4:48 PM  Result Value Ref Range   WBC, UA 0-5 0 - 5 /hpf   RBC, Urine None seen 0 - 2 /hpf   Epithelial Cells (non renal) 0-10 0 - 10 /hpf  Casts None seen None seen /lpf   Bacteria, UA Many (A) None seen/Few  Urinalysis, Routine w reflex microscopic   Collection Time: 01/23/24  4:48 PM  Result Value Ref Range   Specific Gravity, UA 1.019 1.005 - 1.030   pH, UA 7.0 5.0 - 7.5   Color, UA Yellow Yellow   Appearance Ur Cloudy (A) Clear   Leukocytes,UA Trace (A) Negative   Protein,UA Negative Negative/Trace   Glucose, UA Negative Negative   Ketones, UA Negative Negative   RBC, UA Negative Negative   Bilirubin, UA Negative Negative   Urobilinogen, Ur 1.0 0.2 - 1.0 mg/dL   Nitrite, UA Positive (A) Negative   Microscopic Examination See below:      Time out was performed.  A graves speculum was placed in the vagina.  The cervix was visualized, and the strings were visible. They were grasped and the Mirena   IUD was easily removed intact without complications. The patient tolerated the procedure well.   Chaperone: Lorean Rodes Assessment & Plan:   1) Mirena   IUD removal Follow-up prn problems  2) Feels pregnant> neg UPT yesterday, wants HCG  Orders Placed This Encounter  Procedures   Beta hCG quant (ref lab)    Follow-up: Return for after 10/23 for  pap & physical.  Ferd Householder CNM, WHNP-BC 01/24/2024 11:10 AM

## 2024-01-25 ENCOUNTER — Encounter: Payer: Self-pay | Admitting: Women's Health

## 2024-01-25 LAB — CERVICOVAGINAL ANCILLARY ONLY
Bacterial Vaginitis (gardnerella): NEGATIVE
Candida Glabrata: NEGATIVE
Candida Vaginitis: NEGATIVE
Chlamydia: NEGATIVE
Comment: NEGATIVE
Comment: NEGATIVE
Comment: NEGATIVE
Comment: NEGATIVE
Comment: NEGATIVE
Comment: NORMAL
Neisseria Gonorrhea: NEGATIVE
Trichomonas: NEGATIVE

## 2024-01-25 LAB — BETA HCG QUANT (REF LAB): hCG Quant: <1 m[IU]/mL

## 2024-01-26 LAB — URINE CULTURE

## 2024-02-06 ENCOUNTER — Other Ambulatory Visit: Payer: Self-pay | Admitting: Adult Health

## 2024-02-06 MED ORDER — SULFAMETHOXAZOLE-TRIMETHOPRIM 800-160 MG PO TABS: 1.0000 | ORAL_TABLET | Freq: Two times a day (BID) | ORAL | 0 refills | Status: DC

## 2024-02-06 NOTE — Progress Notes (Signed)
Refilled septra ds

## 2024-02-28 ENCOUNTER — Ambulatory Visit: Admitting: Women's Health

## 2024-03-05 NOTE — Addendum Note (Signed)
 Addended by: Jhonny Moss on: 03/05/2024 10:16 AM   Modules accepted: Orders

## 2024-03-05 NOTE — Procedures (Signed)
 ELECTROENCEPHALOGRAM REPORT  Date of Study: 11/02/2023  Patient's Name: Katelyn Martinez MRN: [id number] Date of Birth: [birth year]  Referring Provider: Dr. Rayfield Cairo  Clinical History: This is a 28 year old woman with nocturnal convulsions. EEG for classification.  CNS Active Medications: Gabapentin   Technical Summary: A multichannel digital 42-minute EEG recording measured by the international 10-20 system with electrodes applied with paste and impedances below 5000 ohms performed in our laboratory with EKG monitoring in an awake and asleep patient.  Hyperventilation and photic stimulation were performed.  The digital EEG was referentially recorded, reformatted, and digitally filtered in a variety of bipolar and referential montages for optimal display.    Description: The patient is awake and asleep during the recording.  During maximal wakefulness, there is a symmetric, medium voltage 11 Hz posterior dominant rhythm that attenuates with eye opening.  The record is symmetric.  During drowsiness and sleep, there is an increase in theta slowing of the background.  Vertex waves and symmetric sleep spindles were seen. Hyperventilation did not elicit any abnormalities. During photic stimulation, there was a photoparoxysmal response with irregular spike and wave complexes with greater amplitudes over the occipital regions. Discharges were time-locked to the flash stimulus at 5Hz , 7Hz , and 9Hz  frequencies. There were rare bursts of generalized 4 Hz activity with embedded spikes in the posterior regions seen in wakefulness. There was one burst of 2-3 Hz irregular spike and wave maximal over the posterior regions lasting 5 seconds in sleep. No clinicoelectrographic seizures seen.    EKG lead was unremarkable.  Impression: This 42-minute awake and asleep EEG is abnormal due to the presence of generalized irregular spike and wave discharges maximal over the posterior regions.  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 If further clinical questions remain, prolonged EEG may be helpful.  Clinical correlation is advised.   Rayfield Cairo, M.D.

## 2024-03-19 ENCOUNTER — Other Ambulatory Visit: Payer: Self-pay | Admitting: Neurology

## 2024-04-16 ENCOUNTER — Other Ambulatory Visit (HOSPITAL_COMMUNITY)
Admission: RE | Admit: 2024-04-16 | Discharge: 2024-04-16 | Disposition: A | Discharge status: Home / Self Care | Source: Ambulatory Visit | Attending: Obstetrics & Gynecology | Admitting: Obstetrics & Gynecology

## 2024-04-16 ENCOUNTER — Ambulatory Visit (INDEPENDENT_AMBULATORY_CARE_PROVIDER_SITE_OTHER): Admitting: *Deleted

## 2024-04-16 DIAGNOSIS — N898 Other specified noninflammatory disorders of vagina: Secondary | ICD-10-CM | POA: Insufficient documentation

## 2024-04-16 NOTE — Progress Notes (Signed)
   NURSE VISIT- VAGINITIS  SUBJECTIVE:  Katelyn Martinez is a 28 y.o. [phone] GYN patientfemale here for a vaginal swab for vaginitis screening.  She reports the following symptoms: local irritation and vulvar itching for several days. Denies abnormal vaginal bleeding, significant pelvic pain, fever, or UTI symptoms. No intercourse since April. Recently off her period and thinks pads may have caused the irritation.   OBJECTIVE:  There were no vitals taken for this visit.  Appears well, in no apparent distress  ASSESSMENT: Vaginal swab for vaginitis screening  PLAN: Self-collected vaginal probe for Bacterial Vaginosis, Yeast sent to lab Treatment: to be determined once results are received Follow-up as needed if symptoms persist/worsen, or new symptoms develop  Thurma Priego  04/16/2024 4:05 PM

## 2024-04-18 ENCOUNTER — Ambulatory Visit: Payer: Self-pay | Admitting: Adult Health

## 2024-04-18 LAB — CERVICOVAGINAL ANCILLARY ONLY
Bacterial Vaginitis (gardnerella): NEGATIVE
Candida Glabrata: NEGATIVE
Candida Vaginitis: NEGATIVE
Comment: NEGATIVE
Comment: NEGATIVE
Comment: NEGATIVE

## 2024-06-07 ENCOUNTER — Encounter: Payer: Self-pay | Admitting: Adult Health

## 2024-06-07 ENCOUNTER — Ambulatory Visit: Admitting: Adult Health

## 2024-06-07 ENCOUNTER — Other Ambulatory Visit (HOSPITAL_COMMUNITY)
Admission: RE | Admit: 2024-06-07 | Discharge: 2024-06-07 | Disposition: A | Discharge status: Home / Self Care | Source: Ambulatory Visit | Attending: Adult Health | Admitting: Adult Health

## 2024-06-07 VITALS — BP 108/74 | HR 101 | Ht 65.0 in | Wt 114.0 lb

## 2024-06-07 DIAGNOSIS — Z113 Encounter for screening for infections with a predominantly sexual mode of transmission: Secondary | ICD-10-CM | POA: Insufficient documentation

## 2024-06-07 DIAGNOSIS — R1030 Lower abdominal pain, unspecified: Secondary | ICD-10-CM | POA: Diagnosis not present

## 2024-06-07 DIAGNOSIS — N898 Other specified noninflammatory disorders of vagina: Secondary | ICD-10-CM | POA: Diagnosis present

## 2024-06-07 DIAGNOSIS — K649 Unspecified hemorrhoids: Secondary | ICD-10-CM

## 2024-06-07 MED ORDER — PREPARATION H 0.25 % RE SUPP: 1.0000 | Freq: Every day | RECTAL | Status: DC

## 2024-06-07 MED ORDER — TRIAMCINOLONE ACETONIDE 0.5 % EX OINT: 1.0000 | TOPICAL_OINTMENT | Freq: Two times a day (BID) | CUTANEOUS | 0 refills | Status: DC

## 2024-06-07 NOTE — Progress Notes (Signed)
 Subjective:     Patient ID: Katelyn Martinez, female   DOB: [birth year], 28 y.o.   MRN: [id number]  HPI Katelyn Martinez is a 28 year old white female,single, G3P3003 in complaining of vaginal pain for weeks and low abdominal pain for about 2 days. Last sex was in April. And she had hemorrhoids she says,     Component Value Date/Time   DIAGPAP  07/12/2023 1437    - Negative for intraepithelial lesion or malignancy (NILM)   DIAGPAP  08/03/2020 1149    - Negative for intraepithelial lesion or malignancy (NILM)   HPVHIGH Positive (A) 07/12/2023 1437   ADEQPAP  07/12/2023 1437    Satisfactory for evaluation; transformation zone component PRESENT.   ADEQPAP  08/03/2020 1149    Satisfactory for evaluation; transformation zone component PRESENT.   PCP is Dr Bluford  Review of Systems vaginal pain for weeks and tried monistat +low abdominal pain for about 2 days. Last sex was in April.  And she had hemorrhoids she says   Reviewed past medical,surgical, social and family history. Reviewed medications and allergies.  Objective:   Physical Exam  BP 108/74 (BP Location: Right Arm, Patient Position: Sitting, Cuff Size: Normal)   Pulse (!) 101   Ht 5' 5 (1.651 m)   Wt 114 lb (51.7 kg)   LMP 05/20/2024 (Approximate)   Breastfeeding No   BMI 18.97 kg/m   Skin warm and dry.Pelvic: external genitalia is normal in appearance no lesions, but vulva tender when palpated(had wax recently), vagina: white frothy discharge without odor,urethra has no lesions or masses noted, cervix:smooth and bulbous, uterus: normal size, shape and contour, non tender, no masses felt, adnexa: no masses or tenderness noted. Bladder is non tender and no masses felt. CV swab obtained. On rectal exam, has +hemorrhoids, good tone.  Upstream - 06/07/24 1037       Pregnancy Intention Screening   Does the patient want to become pregnant in the next year? No    Does the patient's partner want to become pregnant in the next year? No     Would the patient like to discuss contraceptive options today? No      Contraception Wrap Up   Current Method No Method - Other Reason    End Method No Method - Other Reason    Contraception Counseling Provided No    How was the end contraceptive method provided? N/A            Examination chaperoned by Aleck Blase RN Assessment:     1. Vaginal irritation (Primary) Vulva area tender Will rx kenalog  CV swab sent  - Cervicovaginal ancillary only  2. Hemorrhoids, unspecified hemorrhoid type +internal hemorrhoids, try preparation H supp Advised against having butt sex, has not had lately  Meds ordered this encounter  Medications   Phenylephrine  HCl (PREPARATION H) 0.25 % SUPP    Sig: Place 1 suppository rectally daily.    Supervising Provider:   JAYNE VONN DEL [[id number]]   triamcinolone  ointment (KENALOG ) 0.5 %    Sig: Apply 1 Application topically 2 (two) times daily.    Dispense:  30 g    Refill:  0    Supervising Provider:   JAYNE, LUTHER H [[id number]]     3. Lower abdominal pain CV swab sent  - Cervicovaginal ancillary only  4. Screen for STD (sexually transmitted disease) CV swab sent for GC/CHL,trich, BV and yeast  - Cervicovaginal ancillary only  5. Vaginal discharge CV swab sent  - Cervicovaginal  ancillary only     Plan:     Follow up prn

## 2024-06-10 LAB — CERVICOVAGINAL ANCILLARY ONLY
Bacterial Vaginitis (gardnerella): NEGATIVE
Candida Glabrata: NEGATIVE
Candida Vaginitis: NEGATIVE
Chlamydia: NEGATIVE
Comment: NEGATIVE
Comment: NEGATIVE
Comment: NEGATIVE
Comment: NEGATIVE
Comment: NEGATIVE
Comment: NORMAL
Neisseria Gonorrhea: NEGATIVE
Trichomonas: NEGATIVE

## 2024-06-11 ENCOUNTER — Ambulatory Visit: Payer: Self-pay | Admitting: Adult Health

## 2024-06-25 ENCOUNTER — Ambulatory Visit

## 2024-06-26 ENCOUNTER — Ambulatory Visit

## 2024-07-01 ENCOUNTER — Ambulatory Visit

## 2024-07-03 ENCOUNTER — Ambulatory Visit

## 2024-07-15 ENCOUNTER — Other Ambulatory Visit (HOSPITAL_COMMUNITY)
Admission: RE | Admit: 2024-07-15 | Discharge: 2024-07-15 | Disposition: A | Discharge status: Home / Self Care | Source: Ambulatory Visit | Attending: Adult Health | Admitting: Adult Health

## 2024-07-15 ENCOUNTER — Ambulatory Visit: Admitting: Adult Health

## 2024-07-15 ENCOUNTER — Encounter: Payer: Self-pay | Admitting: Adult Health

## 2024-07-15 VITALS — BP 106/70 | HR 91 | Ht 65.0 in | Wt 113.0 lb

## 2024-07-15 DIAGNOSIS — Z01419 Encounter for gynecological examination (general) (routine) without abnormal findings: Secondary | ICD-10-CM

## 2024-07-15 DIAGNOSIS — R300 Dysuria: Secondary | ICD-10-CM

## 2024-07-15 DIAGNOSIS — Z1331 Encounter for screening for depression: Secondary | ICD-10-CM | POA: Diagnosis not present

## 2024-07-15 DIAGNOSIS — Z1151 Encounter for screening for human papillomavirus (HPV): Secondary | ICD-10-CM | POA: Diagnosis not present

## 2024-07-15 DIAGNOSIS — Z113 Encounter for screening for infections with a predominantly sexual mode of transmission: Secondary | ICD-10-CM | POA: Diagnosis not present

## 2024-07-15 LAB — POCT URINALYSIS DIPSTICK OB
Glucose, UA: NEGATIVE
Ketones, UA: NEGATIVE
Nitrite, UA: NEGATIVE
POC,PROTEIN,UA: NEGATIVE

## 2024-07-15 MED ORDER — FLUCONAZOLE 150 MG PO TABS: ORAL_TABLET | ORAL | 1 refills | Status: DC

## 2024-07-15 MED ORDER — SULFAMETHOXAZOLE-TRIMETHOPRIM 800-160 MG PO TABS: 1.0000 | ORAL_TABLET | Freq: Two times a day (BID) | ORAL | 0 refills | Status: DC

## 2024-07-15 NOTE — Progress Notes (Signed)
 Patient ID: Katelyn Martinez, female   DOB: 1995-11-17, 28 y.o.   MRN: [id number] History of Present Illness: Katelyn Martinez is a 28 year old white female,single, G3P3003 in for a well woman gyn exam and pap. She has had some burning with urination.   PCP is Dr Bluford   Current Medications, Allergies, Past Medical History, Past Surgical History, Family History and Social History were reviewed in [employer] electronic medical record.     Review of Systems: Patient denies any  hearing loss, fatigue, blurred vision, shortness of breath, chest pain, abdominal pain, problems with bowel movements, or intercourse. (Not active, he is in jail,til next year).No joint pain or mood swings.  +burning with urination  Has pain in back of head more recently, has chronic headaches  Has pad irritation too  Physical Exam:BP 106/70 (BP Location: Right Arm, Patient Position: Sitting, Cuff Size: Normal)   Pulse 91   Ht 5' 5 (1.651 m)   Wt 113 lb (51.3 kg)   LMP 07/09/2024   BMI 18.80 kg/m   urine dipstick trace blood and leuks General:  Well developed, well nourished, no acute distress Skin:  Warm and dry Neck:  Midline trachea, normal thyroid , good ROM, no lymphadenopathy Lungs; Clear to auscultation bilaterally Breast:  No dominant palpable mass, retraction, or nipple discharge, has bilateral nipple rods Cardiovascular: Regular rate and rhythm Abdomen:  Soft, non tender, no hepatosplenomegaly Pelvic:  External genitalia is normal in appearance, no lesions.  The vagina is normal in appearance. Urethra has no lesions or masses. The cervix is bulbous, everted at os, pap with GC/CHL and HR HPV genotyping performed.  Uterus is felt to be normal size, shape, and contour.  No adnexal masses or tenderness noted.Bladder is non tender, no masses felt. Extremities/musculoskeletal:  No swelling or varicosities noted, no clubbing or cyanosis Psych:  No mood changes, alert and cooperative,seems happy AA is 3     07/15/2024   10:39 AM 09/15/2023   11:03 AM 08/15/2023    3:31 PM  Depression screen PHQ 2/9  Decreased Interest 0 2 2  Down, Depressed, Hopeless 0 2 2  PHQ - 2 Score 0 4 4  Altered sleeping 0 2 2  Tired, decreased energy 0 3 2  Change in appetite 0 3 2  Feeling bad or failure about yourself  0 1 2  Trouble concentrating 0 2 1  Moving slowly or fidgety/restless 0 0 2  Suicidal thoughts 0 0 0  PHQ-9 Score 0 15 15  Difficult doing work/chores  Somewhat difficult Very difficult       07/15/2024   10:40 AM 09/15/2023   11:03 AM 08/15/2023    3:32 PM 07/12/2023    2:36 PM  GAD 7 : Generalized Anxiety Score  Nervous, Anxious, on Edge 0 2 2 0  Control/stop worrying 0 2 2 0  Worry too much - different things 0 2 2 0  Trouble relaxing 0 2 0 0  Restless 0 0 0 0  Easily annoyed or irritable 0 2 2 0  Afraid - awful might happen 0 0 2 0  Total GAD 7 Score 0 10 10 0  Anxiety Difficulty  Somewhat difficult Somewhat difficult       Upstream - 07/15/24 1049       Pregnancy Intention Screening   Does the patient want to become pregnant in the next year? No    Does the patient's partner want to become pregnant in the next year? No  Would the patient like to discuss contraceptive options today? No      Contraception Wrap Up   Current Method Abstinence    End Method Abstinence    Contraception Counseling Provided No          Examination chaperoned by Alan Fischer LPN   Impression and plan: 1. Encounter for gynecological examination with Papanicolaou smear of cervix (Primary) Pap sent Pap in 3 years if negative Physical in 1 year To follow up with PCP for headache and labs  - Cytology - PAP( [place])  2. Screen for STD (sexually transmitted disease) GC/CHL on pap - Cytology - PAP( [place])  3. Dysuria Has burning with urination Will get Urine culture to rule out UTI Will rx septa ds and can use OTC AZO And she requests a diflucan  Meds ordered this  encounter  Medications   sulfamethoxazole -trimethoprim  (BACTRIM  DS) 800-160 MG tablet    Sig: Take 1 tablet by mouth 2 (two) times daily. Take 1 bid    Dispense:  14 tablet    Refill:  0    Supervising Provider:   JAYNE MINDER H [[id number]]   fluconazole  (DIFLUCAN ) 150 MG tablet    Sig: Take 1 now and 1 in 3 days    Dispense:  2 tablet    Refill:  1    Supervising Provider:   JAYNE MINDER H [[id number]]    - POC Urinalysis Dipstick OB - Urine Culture

## 2024-07-17 ENCOUNTER — Ambulatory Visit: Payer: Self-pay | Admitting: Adult Health

## 2024-07-17 LAB — CYTOLOGY - PAP
Chlamydia: NEGATIVE
Comment: NEGATIVE
Comment: NEGATIVE
Comment: NORMAL
Diagnosis: NEGATIVE
High risk HPV: NEGATIVE
Neisseria Gonorrhea: NEGATIVE

## 2024-07-17 LAB — URINE CULTURE

## 2024-07-23 ENCOUNTER — Other Ambulatory Visit: Payer: Self-pay | Admitting: Adult Health

## 2024-07-23 DIAGNOSIS — R300 Dysuria: Secondary | ICD-10-CM

## 2024-07-23 DIAGNOSIS — Z113 Encounter for screening for infections with a predominantly sexual mode of transmission: Secondary | ICD-10-CM

## 2024-07-23 NOTE — Progress Notes (Signed)
 Check HIV and RPR and refer to urology.

## 2024-07-24 ENCOUNTER — Ambulatory Visit: Payer: Self-pay | Admitting: Adult Health

## 2024-07-24 LAB — RPR: RPR Ser Ql: NONREACTIVE

## 2024-07-24 LAB — HIV ANTIBODY (ROUTINE TESTING W REFLEX): HIV Screen 4th Generation wRfx: NONREACTIVE

## 2024-08-02 ENCOUNTER — Other Ambulatory Visit: Payer: Self-pay | Admitting: Neurology

## 2024-08-28 ENCOUNTER — Ambulatory Visit: Admitting: Women's Health

## 2024-08-29 ENCOUNTER — Ambulatory Visit: Admitting: Adult Health

## 2024-08-29 ENCOUNTER — Encounter: Payer: Self-pay | Admitting: Adult Health

## 2024-08-29 VITALS — BP 106/68 | HR 85 | Ht 65.0 in | Wt 110.0 lb

## 2024-08-29 DIAGNOSIS — N9089 Other specified noninflammatory disorders of vulva and perineum: Secondary | ICD-10-CM | POA: Diagnosis not present

## 2024-08-29 DIAGNOSIS — L292 Pruritus vulvae: Secondary | ICD-10-CM | POA: Insufficient documentation

## 2024-08-29 MED ORDER — TRIAMCINOLONE ACETONIDE 0.5 % EX OINT: 1.0000 | TOPICAL_OINTMENT | Freq: Two times a day (BID) | CUTANEOUS | 1 refills | Status: AC

## 2024-08-29 NOTE — Progress Notes (Signed)
°  Subjective:     Patient ID: Katelyn Martinez, female   DOB: 1996-05-26, 28 y.o.   MRN: [id number]  HPI [name] is a 28 year old white female,single, G3P3003 in complaining of vulva itching and irritation at times, has changed detergents.     Component Value Date/Time   DIAGPAP  07/15/2024 1034    - Negative for intraepithelial lesion or malignancy (NILM)   DIAGPAP  07/12/2023 1437    - Negative for intraepithelial lesion or malignancy (NILM)   DIAGPAP  08/03/2020 1149    - Negative for intraepithelial lesion or malignancy (NILM)   HPVHIGH Negative 07/15/2024 1034   HPVHIGH Positive (A) 07/12/2023 1437   ADEQPAP  07/15/2024 1034    Satisfactory for evaluation; transformation zone component PRESENT.   ADEQPAP  07/12/2023 1437    Satisfactory for evaluation; transformation zone component PRESENT.   ADEQPAP  08/03/2020 1149    Satisfactory for evaluation; transformation zone component PRESENT.   PCP is Dr Bluford  Review of Systems Has itching and irritation at times on vulva Has not had sex since April, he is in jail Reviewed past medical,surgical, social and family history. Reviewed medications and allergies.     Objective:   Physical Exam BP 106/68 (BP Location: Left Arm, Patient Position: Sitting, Cuff Size: Normal)   Pulse 85   Ht 5' 5 (1.651 m)   Wt 110 lb (49.9 kg)   LMP 08/27/2024 (Approximate)   BMI 18.30 kg/m     Skin warm and dry.Pelvic: external genitalia is normal in appearance no lesions, vagina:  +brown blood,urethra has no lesions or masses noted, cervix:smooth and bulbous, uterus: normal size, shape and contour, non tender, no masses felt, adnexa: no masses or tenderness noted. Bladder is non tender and no masses felt.  Fall risk is low  Upstream - 08/29/24 0924       Pregnancy Intention Screening   Does the patient want to become pregnant in the next year? No    Does the patient's partner want to become pregnant in the next year? No    Would the patient like  to discuss contraceptive options today? No      Contraception Wrap Up   Current Method Abstinence    End Method Abstinence    Contraception Counseling Provided No          Assessment:     1. Vulvar itching (Primary) Has on and off itching Will refill kenalog  0.5% ointment to use prn   Meds ordered this encounter  Medications   triamcinolone  ointment (KENALOG ) 0.5 %    Sig: Apply 1 Application topically 2 (two) times daily.    Dispense:  30 g    Refill:  1    Supervising Provider:   JAYNE MINDER H [[id number]]     2. Vulvar irritation     Plan:     Follow up prn

## 2024-09-03 ENCOUNTER — Ambulatory Visit: Admitting: Neurology

## 2024-09-03 ENCOUNTER — Encounter: Payer: Self-pay | Admitting: Neurology

## 2024-09-06 ENCOUNTER — Other Ambulatory Visit: Payer: Self-pay | Admitting: Neurology

## 2024-09-09 ENCOUNTER — Ambulatory Visit: Admitting: Urology

## 2024-10-08 ENCOUNTER — Other Ambulatory Visit: Payer: Self-pay | Admitting: Neurology

## 2024-10-08 ENCOUNTER — Telehealth: Payer: Self-pay | Admitting: Neurology

## 2024-10-08 MED ORDER — ZONISAMIDE 100 MG PO CAPS: ORAL_CAPSULE | ORAL | 0 refills | Status: AC

## 2024-10-08 NOTE — Telephone Encounter (Signed)
 Refills have been sent in for pt

## 2024-10-08 NOTE — Telephone Encounter (Signed)
 Pt needs ASAP RX refill   zonisamide  (ZONEGRAN ) 100 MG capsule  [facility] [place]

## 2025-01-27 ENCOUNTER — Ambulatory Visit: Admitting: Neurology
